# Patient Record
Sex: Male | Born: 1945 | Race: White | Hispanic: No | Marital: Married | Smoking: Former smoker
Health system: Southern US, Community
[De-identification: ages and names within clinical notes are randomized; demographics above are authoritative.]

## PROBLEM LIST (undated history)

## (undated) DIAGNOSIS — E785 Hyperlipidemia, unspecified: Secondary | ICD-10-CM

## (undated) DIAGNOSIS — I472 Ventricular tachycardia, unspecified: Secondary | ICD-10-CM

## (undated) DIAGNOSIS — I5022 Chronic systolic (congestive) heart failure: Secondary | ICD-10-CM

## (undated) DIAGNOSIS — I255 Ischemic cardiomyopathy: Secondary | ICD-10-CM

## (undated) DIAGNOSIS — I4891 Unspecified atrial fibrillation: Secondary | ICD-10-CM

## (undated) DIAGNOSIS — I10 Essential (primary) hypertension: Secondary | ICD-10-CM

## (undated) DIAGNOSIS — I2510 Atherosclerotic heart disease of native coronary artery without angina pectoris: Secondary | ICD-10-CM

## (undated) HISTORY — DX: Essential (primary) hypertension: I10

## (undated) HISTORY — DX: Ventricular tachycardia, unspecified: I47.20

## (undated) HISTORY — DX: Ventricular tachycardia: I47.2

## (undated) HISTORY — DX: Chronic systolic (congestive) heart failure: I50.22

## (undated) HISTORY — PX: CARDIAC CATHETERIZATION: SHX172

## (undated) HISTORY — DX: Atherosclerotic heart disease of native coronary artery without angina pectoris: I25.10

## (undated) HISTORY — DX: Hyperlipidemia, unspecified: E78.5

## (undated) HISTORY — DX: Unspecified atrial fibrillation: I48.91

## (undated) HISTORY — PX: BLADDER TUMOR EXCISION: SHX238

## (undated) HISTORY — DX: Ischemic cardiomyopathy: I25.5

---

## 1995-05-06 HISTORY — PX: CORONARY ARTERY BYPASS GRAFT: SHX141

## 1997-12-24 ENCOUNTER — Ambulatory Visit (HOSPITAL_COMMUNITY): Admission: RE | Admit: 1997-12-24 | Discharge: 1997-12-24 | Payer: Self-pay | Admitting: Orthopedic Surgery

## 1998-05-24 ENCOUNTER — Encounter: Payer: Self-pay | Admitting: Cardiology

## 1998-05-24 ENCOUNTER — Ambulatory Visit (HOSPITAL_COMMUNITY): Admission: RE | Admit: 1998-05-24 | Discharge: 1998-05-24 | Payer: Self-pay | Admitting: Cardiology

## 1998-06-18 ENCOUNTER — Ambulatory Visit (HOSPITAL_COMMUNITY): Admission: RE | Admit: 1998-06-18 | Discharge: 1998-06-19 | Payer: Self-pay | Admitting: Orthopedic Surgery

## 1999-07-11 ENCOUNTER — Encounter: Payer: Self-pay | Admitting: Emergency Medicine

## 1999-07-11 ENCOUNTER — Emergency Department (HOSPITAL_COMMUNITY): Admission: EM | Admit: 1999-07-11 | Discharge: 1999-07-11 | Payer: Self-pay | Admitting: Emergency Medicine

## 1999-08-15 ENCOUNTER — Emergency Department (HOSPITAL_COMMUNITY): Admission: EM | Admit: 1999-08-15 | Discharge: 1999-08-15 | Payer: Self-pay | Admitting: Emergency Medicine

## 1999-09-11 ENCOUNTER — Encounter: Payer: Self-pay | Admitting: Emergency Medicine

## 1999-09-11 ENCOUNTER — Emergency Department (HOSPITAL_COMMUNITY): Admission: EM | Admit: 1999-09-11 | Discharge: 1999-09-11 | Payer: Self-pay | Admitting: Emergency Medicine

## 2000-11-24 ENCOUNTER — Emergency Department (HOSPITAL_COMMUNITY): Admission: EM | Admit: 2000-11-24 | Discharge: 2000-11-24 | Payer: Self-pay | Admitting: *Deleted

## 2000-11-25 ENCOUNTER — Inpatient Hospital Stay (HOSPITAL_COMMUNITY): Admission: EM | Admit: 2000-11-25 | Discharge: 2000-11-26 | Payer: Self-pay | Admitting: Emergency Medicine

## 2000-11-25 ENCOUNTER — Encounter: Payer: Self-pay | Admitting: Emergency Medicine

## 2000-12-31 ENCOUNTER — Ambulatory Visit (HOSPITAL_BASED_OUTPATIENT_CLINIC_OR_DEPARTMENT_OTHER): Admission: RE | Admit: 2000-12-31 | Discharge: 2000-12-31 | Payer: Self-pay | Admitting: Orthopedic Surgery

## 2001-04-19 ENCOUNTER — Emergency Department (HOSPITAL_COMMUNITY): Admission: EM | Admit: 2001-04-19 | Discharge: 2001-04-19 | Payer: Self-pay | Admitting: Emergency Medicine

## 2001-08-08 ENCOUNTER — Inpatient Hospital Stay (HOSPITAL_COMMUNITY): Admission: EM | Admit: 2001-08-08 | Discharge: 2001-08-10 | Payer: Self-pay

## 2001-08-08 ENCOUNTER — Encounter: Payer: Self-pay | Admitting: Cardiology

## 2002-07-15 ENCOUNTER — Emergency Department (HOSPITAL_COMMUNITY): Admission: EM | Admit: 2002-07-15 | Discharge: 2002-07-15 | Payer: Self-pay | Admitting: Emergency Medicine

## 2004-04-24 ENCOUNTER — Ambulatory Visit: Payer: Self-pay | Admitting: Physician Assistant

## 2004-05-05 HISTORY — PX: OTHER SURGICAL HISTORY: SHX169

## 2004-05-09 ENCOUNTER — Ambulatory Visit: Payer: Self-pay | Admitting: Pain Medicine

## 2004-05-22 ENCOUNTER — Ambulatory Visit: Payer: Self-pay | Admitting: Physician Assistant

## 2004-06-14 ENCOUNTER — Inpatient Hospital Stay (HOSPITAL_COMMUNITY): Admission: EM | Admit: 2004-06-14 | Discharge: 2004-06-19 | Payer: Self-pay | Admitting: Emergency Medicine

## 2004-07-22 ENCOUNTER — Ambulatory Visit: Payer: Self-pay | Admitting: Physician Assistant

## 2004-08-21 ENCOUNTER — Ambulatory Visit: Payer: Self-pay | Admitting: Physician Assistant

## 2004-09-17 ENCOUNTER — Ambulatory Visit: Payer: Self-pay | Admitting: Physician Assistant

## 2004-10-17 ENCOUNTER — Ambulatory Visit: Payer: Self-pay | Admitting: Physician Assistant

## 2004-11-06 ENCOUNTER — Encounter: Payer: Self-pay | Admitting: Pain Medicine

## 2004-11-15 ENCOUNTER — Ambulatory Visit: Payer: Self-pay | Admitting: Physician Assistant

## 2004-12-03 ENCOUNTER — Encounter: Payer: Self-pay | Admitting: Pain Medicine

## 2004-12-12 ENCOUNTER — Ambulatory Visit: Payer: Self-pay | Admitting: Physician Assistant

## 2005-01-09 ENCOUNTER — Ambulatory Visit: Payer: Self-pay | Admitting: Physician Assistant

## 2005-02-10 ENCOUNTER — Ambulatory Visit: Payer: Self-pay | Admitting: Physician Assistant

## 2005-03-13 ENCOUNTER — Ambulatory Visit: Payer: Self-pay | Admitting: Physician Assistant

## 2005-04-14 ENCOUNTER — Ambulatory Visit: Payer: Self-pay | Admitting: Physician Assistant

## 2005-05-13 ENCOUNTER — Ambulatory Visit: Payer: Self-pay | Admitting: Physician Assistant

## 2005-06-12 ENCOUNTER — Ambulatory Visit: Payer: Self-pay | Admitting: Physician Assistant

## 2005-07-18 ENCOUNTER — Ambulatory Visit: Payer: Self-pay | Admitting: Physician Assistant

## 2005-08-13 ENCOUNTER — Ambulatory Visit: Payer: Self-pay | Admitting: Physician Assistant

## 2005-09-10 ENCOUNTER — Ambulatory Visit: Payer: Self-pay | Admitting: Physician Assistant

## 2005-09-18 ENCOUNTER — Ambulatory Visit: Payer: Self-pay | Admitting: Pain Medicine

## 2005-10-13 ENCOUNTER — Ambulatory Visit: Payer: Self-pay | Admitting: Physician Assistant

## 2006-02-03 ENCOUNTER — Ambulatory Visit: Payer: Self-pay | Admitting: Physician Assistant

## 2006-03-11 ENCOUNTER — Ambulatory Visit: Payer: Self-pay | Admitting: Physician Assistant

## 2006-04-09 ENCOUNTER — Ambulatory Visit: Payer: Self-pay | Admitting: Physician Assistant

## 2006-05-11 ENCOUNTER — Ambulatory Visit: Payer: Self-pay | Admitting: Pain Medicine

## 2006-07-09 ENCOUNTER — Ambulatory Visit: Payer: Self-pay | Admitting: Physician Assistant

## 2006-10-07 ENCOUNTER — Ambulatory Visit: Payer: Self-pay | Admitting: Physician Assistant

## 2006-12-05 ENCOUNTER — Emergency Department: Payer: Self-pay | Admitting: Emergency Medicine

## 2006-12-19 ENCOUNTER — Emergency Department: Payer: Self-pay | Admitting: Emergency Medicine

## 2007-01-05 ENCOUNTER — Ambulatory Visit: Payer: Self-pay | Admitting: Physician Assistant

## 2007-04-05 ENCOUNTER — Ambulatory Visit: Payer: Self-pay | Admitting: Physician Assistant

## 2007-04-07 ENCOUNTER — Ambulatory Visit: Payer: Self-pay | Admitting: Physician Assistant

## 2007-05-10 ENCOUNTER — Ambulatory Visit: Payer: Self-pay | Admitting: Physician Assistant

## 2007-05-26 ENCOUNTER — Encounter: Payer: Self-pay | Admitting: Pain Medicine

## 2007-06-06 ENCOUNTER — Encounter: Payer: Self-pay | Admitting: Pain Medicine

## 2007-06-08 ENCOUNTER — Ambulatory Visit: Payer: Self-pay | Admitting: Physician Assistant

## 2007-07-04 ENCOUNTER — Encounter: Payer: Self-pay | Admitting: Pain Medicine

## 2007-07-08 ENCOUNTER — Ambulatory Visit: Payer: Self-pay | Admitting: Physician Assistant

## 2007-08-05 ENCOUNTER — Ambulatory Visit: Payer: Self-pay | Admitting: Physician Assistant

## 2007-09-08 ENCOUNTER — Ambulatory Visit: Payer: Self-pay | Admitting: Physician Assistant

## 2007-10-06 ENCOUNTER — Ambulatory Visit: Payer: Self-pay | Admitting: Physician Assistant

## 2007-11-04 ENCOUNTER — Ambulatory Visit: Payer: Self-pay | Admitting: Physician Assistant

## 2007-12-09 ENCOUNTER — Ambulatory Visit: Payer: Self-pay | Admitting: Physician Assistant

## 2008-01-06 ENCOUNTER — Ambulatory Visit: Payer: Self-pay | Admitting: Physician Assistant

## 2008-02-23 ENCOUNTER — Ambulatory Visit: Payer: Self-pay | Admitting: Physician Assistant

## 2008-05-10 ENCOUNTER — Ambulatory Visit: Payer: Self-pay | Admitting: Physician Assistant

## 2008-08-03 ENCOUNTER — Ambulatory Visit: Payer: Self-pay | Admitting: Physician Assistant

## 2008-08-21 ENCOUNTER — Encounter: Payer: Self-pay | Admitting: Cardiovascular Disease

## 2008-10-17 ENCOUNTER — Ambulatory Visit: Payer: Self-pay | Admitting: Physician Assistant

## 2009-01-11 ENCOUNTER — Ambulatory Visit: Payer: Self-pay | Admitting: Physician Assistant

## 2009-02-12 ENCOUNTER — Ambulatory Visit: Payer: Self-pay | Admitting: Cardiovascular Disease

## 2009-02-12 ENCOUNTER — Inpatient Hospital Stay (HOSPITAL_COMMUNITY): Admission: EM | Admit: 2009-02-12 | Discharge: 2009-02-15 | Payer: Self-pay | Admitting: Emergency Medicine

## 2009-04-17 ENCOUNTER — Ambulatory Visit: Payer: Self-pay | Admitting: Physician Assistant

## 2009-05-06 ENCOUNTER — Ambulatory Visit: Payer: Self-pay | Admitting: Internal Medicine

## 2009-05-06 ENCOUNTER — Inpatient Hospital Stay (HOSPITAL_COMMUNITY)
Admission: EM | Admit: 2009-05-06 | Discharge: 2009-05-09 | Payer: Self-pay | Source: Home / Self Care | Admitting: Emergency Medicine

## 2009-05-07 ENCOUNTER — Encounter: Payer: Self-pay | Admitting: Cardiovascular Disease

## 2009-05-07 ENCOUNTER — Encounter (INDEPENDENT_AMBULATORY_CARE_PROVIDER_SITE_OTHER): Payer: Self-pay | Admitting: Cardiovascular Disease

## 2009-05-08 ENCOUNTER — Encounter: Payer: Self-pay | Admitting: Cardiovascular Disease

## 2009-05-09 ENCOUNTER — Encounter: Payer: Self-pay | Admitting: Cardiovascular Disease

## 2009-05-18 ENCOUNTER — Encounter: Admission: RE | Admit: 2009-05-18 | Discharge: 2009-05-18 | Payer: Self-pay | Admitting: Cardiovascular Disease

## 2009-06-11 ENCOUNTER — Ambulatory Visit: Payer: Self-pay | Admitting: Pain Medicine

## 2009-06-21 ENCOUNTER — Encounter: Payer: Self-pay | Admitting: Cardiovascular Disease

## 2009-06-21 ENCOUNTER — Encounter: Payer: Self-pay | Admitting: Internal Medicine

## 2009-06-21 ENCOUNTER — Ambulatory Visit: Payer: Self-pay | Admitting: Internal Medicine

## 2009-06-21 DIAGNOSIS — I440 Atrioventricular block, first degree: Secondary | ICD-10-CM

## 2009-06-21 DIAGNOSIS — E7800 Pure hypercholesterolemia, unspecified: Secondary | ICD-10-CM | POA: Insufficient documentation

## 2009-06-21 DIAGNOSIS — I2589 Other forms of chronic ischemic heart disease: Secondary | ICD-10-CM | POA: Insufficient documentation

## 2009-06-21 DIAGNOSIS — Z95810 Presence of automatic (implantable) cardiac defibrillator: Secondary | ICD-10-CM | POA: Insufficient documentation

## 2009-06-21 DIAGNOSIS — I472 Ventricular tachycardia, unspecified: Secondary | ICD-10-CM | POA: Insufficient documentation

## 2009-06-21 DIAGNOSIS — I25810 Atherosclerosis of coronary artery bypass graft(s) without angina pectoris: Secondary | ICD-10-CM

## 2009-06-21 DIAGNOSIS — R0989 Other specified symptoms and signs involving the circulatory and respiratory systems: Secondary | ICD-10-CM

## 2009-06-21 DIAGNOSIS — I452 Bifascicular block: Secondary | ICD-10-CM | POA: Insufficient documentation

## 2009-06-21 DIAGNOSIS — R0609 Other forms of dyspnea: Secondary | ICD-10-CM

## 2009-07-20 ENCOUNTER — Ambulatory Visit: Payer: Self-pay | Admitting: Cardiovascular Disease

## 2009-07-23 ENCOUNTER — Encounter: Payer: Self-pay | Admitting: Cardiovascular Disease

## 2009-07-23 ENCOUNTER — Ambulatory Visit: Payer: Self-pay | Admitting: Internal Medicine

## 2009-07-23 DIAGNOSIS — R740 Nonspecific elevation of levels of transaminase and lactic acid dehydrogenase [LDH]: Secondary | ICD-10-CM

## 2009-07-23 DIAGNOSIS — I959 Hypotension, unspecified: Secondary | ICD-10-CM | POA: Insufficient documentation

## 2009-07-24 LAB — CONVERTED CEMR LAB
Albumin: 4.1 g/dL (ref 3.5–5.2)
Alkaline Phosphatase: 76 units/L (ref 39–117)
BUN: 23 mg/dL (ref 6–23)
Cortisol, Plasma: 28.5 ug/dL
Free T4: 1.52 ng/dL (ref 0.80–1.80)
Glucose, Bld: 103 mg/dL — ABNORMAL HIGH (ref 70–99)
Hemoglobin: 14.1 g/dL (ref 13.0–17.0)
MCHC: 32.2 g/dL (ref 30.0–36.0)
MCV: 94.4 fL (ref 78.0–100.0)
Potassium: 4.6 meq/L (ref 3.5–5.3)
RBC: 4.64 M/uL (ref 4.22–5.81)
TSH: 4.417 microintl units/mL (ref 0.350–4.500)
Total Bilirubin: 0.5 mg/dL (ref 0.3–1.2)

## 2009-07-31 ENCOUNTER — Encounter: Payer: Self-pay | Admitting: Internal Medicine

## 2009-07-31 ENCOUNTER — Ambulatory Visit: Payer: Self-pay

## 2009-08-02 ENCOUNTER — Ambulatory Visit: Payer: Self-pay | Admitting: Cardiovascular Disease

## 2009-08-06 ENCOUNTER — Encounter: Payer: Self-pay | Admitting: Cardiovascular Disease

## 2009-08-21 ENCOUNTER — Encounter: Payer: Self-pay | Admitting: Cardiovascular Disease

## 2009-08-27 ENCOUNTER — Ambulatory Visit: Payer: Self-pay | Admitting: Internal Medicine

## 2009-09-02 ENCOUNTER — Encounter: Payer: Self-pay | Admitting: Cardiovascular Disease

## 2009-10-03 ENCOUNTER — Encounter: Payer: Self-pay | Admitting: Cardiovascular Disease

## 2009-10-15 ENCOUNTER — Ambulatory Visit: Payer: Self-pay | Admitting: Cardiovascular Disease

## 2009-10-17 ENCOUNTER — Encounter: Payer: Self-pay | Admitting: Cardiovascular Disease

## 2009-10-19 LAB — CONVERTED CEMR LAB
ALT: 45 units/L (ref 0–53)
Bilirubin, Direct: 0.1 mg/dL (ref 0.0–0.3)
Cholesterol: 253 mg/dL — ABNORMAL HIGH (ref 0–200)
Indirect Bilirubin: 0.3 mg/dL (ref 0.0–0.9)
Total Bilirubin: 0.4 mg/dL (ref 0.3–1.2)
VLDL: 63 mg/dL — ABNORMAL HIGH (ref 0–40)

## 2009-10-22 ENCOUNTER — Ambulatory Visit: Payer: Self-pay | Admitting: Cardiovascular Disease

## 2009-10-24 ENCOUNTER — Emergency Department: Payer: Self-pay | Admitting: Emergency Medicine

## 2009-11-09 ENCOUNTER — Telehealth: Payer: Self-pay | Admitting: Cardiovascular Disease

## 2009-11-28 ENCOUNTER — Ambulatory Visit: Payer: Self-pay | Admitting: Internal Medicine

## 2009-12-03 ENCOUNTER — Encounter: Payer: Self-pay | Admitting: Cardiovascular Disease

## 2010-01-08 ENCOUNTER — Ambulatory Visit: Payer: Self-pay | Admitting: Cardiovascular Disease

## 2010-01-10 ENCOUNTER — Telehealth (INDEPENDENT_AMBULATORY_CARE_PROVIDER_SITE_OTHER): Payer: Self-pay | Admitting: *Deleted

## 2010-01-14 LAB — CONVERTED CEMR LAB
ALT: 23 units/L (ref 0–53)
Albumin: 4.2 g/dL (ref 3.5–5.2)
HDL: 35 mg/dL — ABNORMAL LOW (ref >39)
Indirect Bilirubin: 0.4 mg/dL (ref 0.0–0.9)
LDL Cholesterol: 33 mg/dL (ref 0–99)
Total CHOL/HDL Ratio: 3
Total Protein: 6.5 g/dL (ref 6.0–8.3)
Triglycerides: 188 mg/dL — ABNORMAL HIGH (ref <150)
VLDL: 38 mg/dL (ref 0–40)

## 2010-03-05 ENCOUNTER — Encounter: Payer: Self-pay | Admitting: Internal Medicine

## 2010-03-05 ENCOUNTER — Ambulatory Visit: Payer: Self-pay | Admitting: Cardiovascular Disease

## 2010-04-23 ENCOUNTER — Ambulatory Visit: Payer: Self-pay | Admitting: Cardiovascular Disease

## 2010-04-23 ENCOUNTER — Encounter: Payer: Self-pay | Admitting: Cardiovascular Disease

## 2010-05-27 ENCOUNTER — Encounter: Payer: Self-pay | Admitting: Cardiovascular Disease

## 2010-05-29 ENCOUNTER — Telehealth: Payer: Self-pay | Admitting: Cardiovascular Disease

## 2010-05-29 ENCOUNTER — Observation Stay (HOSPITAL_COMMUNITY)
Admission: EM | Admit: 2010-05-29 | Discharge: 2010-05-30 | Payer: Self-pay | Source: Home / Self Care | Attending: Internal Medicine | Admitting: Internal Medicine

## 2010-05-29 LAB — DIFFERENTIAL
Basophils Absolute: 0 10*3/uL (ref 0.0–0.1)
Eosinophils Relative: 2 % (ref 0–5)
Lymphocytes Relative: 27 % (ref 12–46)
Neutro Abs: 4.5 10*3/uL (ref 1.7–7.7)
Neutrophils Relative %: 62 % (ref 43–77)

## 2010-05-29 LAB — POCT I-STAT, CHEM 8
Chloride: 106 mEq/L (ref 96–112)
Glucose, Bld: 92 mg/dL (ref 70–99)
HCT: 48 % (ref 39.0–52.0)
Potassium: 4.1 mEq/L (ref 3.5–5.1)

## 2010-05-29 LAB — DIGOXIN LEVEL: Digoxin Level: 0.3 ng/mL — ABNORMAL LOW (ref 0.8–2.0)

## 2010-05-29 LAB — POCT CARDIAC MARKERS
CKMB, poc: 1.7 ng/mL (ref 1.0–8.0)
Myoglobin, poc: 108 ng/mL (ref 12–200)

## 2010-05-29 LAB — CBC
HCT: 45.6 % (ref 39.0–52.0)
RBC: 5.02 MIL/uL (ref 4.22–5.81)
RDW: 13.5 % (ref 11.5–15.5)
WBC: 7.3 10*3/uL (ref 4.0–10.5)

## 2010-05-29 LAB — CK TOTAL AND CKMB (NOT AT ARMC): CK, MB: 2.4 ng/mL (ref 0.3–4.0)

## 2010-05-30 LAB — COMPREHENSIVE METABOLIC PANEL
AST: 25 U/L (ref 0–37)
Albumin: 3.5 g/dL (ref 3.5–5.2)
Calcium: 9 mg/dL (ref 8.4–10.5)
Creatinine, Ser: 1.15 mg/dL (ref 0.4–1.5)
GFR calc Af Amer: >60 mL/min (ref >60)

## 2010-05-30 LAB — CARDIAC PANEL(CRET KIN+CKTOT+MB+TROPI)
Relative Index: 1.4 (ref 0.0–2.5)
Total CK: 154 U/L (ref 7–232)
Troponin I: 0.04 ng/mL (ref 0.00–0.06)

## 2010-06-04 NOTE — Cardiovascular Report (Signed)
Summary: Office Visit   Office Visit   Imported By: Roderic Ovens 11/30/2009 10:20:07  _____________________________________________________________________  External Attachment:    Type:   Image     Comment:   External Document

## 2010-06-04 NOTE — Miscellaneous (Signed)
Summary: Heart Track Discharge  Heart Track Discharge   Imported By: Harlon Flor 10/24/2009 07:51:28  _____________________________________________________________________  External Attachment:    Type:   Image     Comment:   External Document

## 2010-06-04 NOTE — Letter (Signed)
Summary: Discharge Summary  Discharge Summary   Imported By: Harlon Flor 08/06/2009 09:34:27  _____________________________________________________________________  External Attachment:    Type:   Image     Comment:   External Document

## 2010-06-04 NOTE — Assessment & Plan Note (Signed)
Summary: NURSE/AMD  Nurse Visit   Patient Instructions: 1)  Your physician has recommended you make the following change in your medication: hold lisinopril, decrease metoprolol to 50 mg twice a day 2)  Your physician has requested that you regularly monitor and record your blood pressure readings at home.  Please use the same machine at the same time of day to check your readings and record them to bring to your follow-up visit.   Allergies: No Known Drug Allergies  Appended Document: NURSE/AMD vs @ visit: 72/palp with doppler. (L) 68/palp with doppler on (R) HR- 80 SR

## 2010-06-04 NOTE — Medication Information (Signed)
Summary: Tax adviser   Imported By: Harlon Flor 12/10/2009 10:34:53  _____________________________________________________________________  External Attachment:    Type:   Image     Comment:   External Document

## 2010-06-04 NOTE — Cardiovascular Report (Signed)
Summary: Cardiac Cath Other  Cardiac Cath Other   Imported By: Harlon Flor 08/06/2009 09:34:02  _____________________________________________________________________  External Attachment:    Type:   Image     Comment:   External Document

## 2010-06-04 NOTE — Procedures (Signed)
Summary: PACER CHECK   Current Medications (verified): 1)  Carvedilol 12.5 Mg Tabs (Carvedilol) .... Take 1 Tablet By Mouth Two Times A Day 2)  Tamsulosin Hcl 0.4 Mg Caps (Tamsulosin Hcl) .Marland Kitchen.. 1 By Mouth Once Daily 3)  Digoxin 0.125 Mg Tabs (Digoxin) .Marland Kitchen.. 1  Tablet By Mouth Daily 4)  Effient 10 Mg Tabs (Prasugrel Hcl) .... Take 1 Tablet By Mouth Once A Day 5)  Nitrostat 0.4 Mg Subl (Nitroglycerin) .Marland Kitchen.. 1 Tablet Under Tongue At Onset of Chest Pain; You May Repeat Every 5 Minutes For Up To 3 Doses. 6)  Niacin Cr 500 Mg Cr-Caps (Niacin) .Marland Kitchen.. 1 By Mouth Once Daily 7)  Fish Oil 1000 Mg Caps (Omega-3 Fatty Acids) .Marland Kitchen.. 1 By Mouth Once Daily 8)  Hydrocodone-Acetaminophen 5-500 Mg Tabs (Hydrocodone-Acetaminophen) .... As Directed 9)  Tylenol 325 Mg Tabs (Acetaminophen) .... As Needed 10)  Aspirin 81 Mg Tbec (Aspirin) .... Take One Tablet By Mouth Daily 11)  Mexiletine Hcl 250 Mg Caps (Mexiletine Hcl) .... Take 1 By Mouth Two Times A Day 12)  Klor-Con 10 10 Meq Cr-Tabs (Potassium Chloride) .... Take 2 By Mouth Once Daily 13)  Furosemide 20 Mg Tabs (Furosemide) .... Take One Tablet By Mouth Daily. 14)  Crestor 10 Mg Tabs (Rosuvastatin Calcium) .... Take 1/2  Tablet By Mouth Daily.  Allergies (verified): No Known Drug Allergies    ICD Specifications Following MD:  Sherryl Manges, MD     ICD Vendor:  [employer]     ICD Model Number:  [id number]     ICD Serial Number:  [id number] ICD DOI:  06/17/2004     ICD Implanting MD:  NOT IMPLANTED BY [place]  Lead 1:    Location: RA     DOI: 06/17/2004     Model #: [id number]     Serial #: [id number]     Status: active Lead 2:    Location: RV     DOI: 06/17/2004     Model #: [id number]     Status: active  Indications::  VT   ICD Follow Up Remote Check?  No Battery Voltage:  2.66 V     Charge Time:  10.9 seconds     Underlying rhythm:  SR ICD Dependent:  No       ICD Device Measurements Atrium:  Amplitude: 2.1 mV, Impedance: 544 ohms, Threshold: 1.0 V at 0.4 msec Right  Ventricle:  Amplitude: 4.7 mV, Impedance: 608 ohms, Threshold: 0.5 V at 0.4 msec Shock Impedance: 47/61 ohms   Episodes MS Episodes:  0     Percent Mode Switch:  0     Coumadin:  No Shock:  0     ATP:  2     Nonsustained:  0     Atrial Pacing:  94.3%     Ventricular Pacing:  2.8%  Brady Parameters Mode DDDR     Lower Rate Limit:  70     Upper Rate Limit 110 PAV 280     Sensed AV Delay:  240 Rate Response Parameters:  Rate response 8, medium/low  Tachy Zones VF:  214     VT:  150     VT1:  130     Next Cardiology Appt Due:  06/05/2010 Tech Comments:  No parameter changes.  2 VT episodes back to back treated appropriately with ATP therapy.  ROV 3 months  clinic. Altha Harm, LPN  March 05, 2010 4:19 PM

## 2010-06-04 NOTE — Progress Notes (Signed)
Summary: MEDICATIONS  Phone Note Call from Patient Call back at Home Phone [phone]   Caller: Patient Call For: Armonie Mettler Summary of Call: PATIENT WOULD LIKE TO KNOW IF ANY OF HIS MEDICATIONS COULD BE SWITCHED TO GEL CAPSULE AS HE RECENTLY IS HAVING TROUBLE SWALLOWING HIS PILLS. Initial call taken by: [place] Carbo,  November 09, 2009 11:01 AM  Follow-up for Phone Call        pt instructed to talk to pharmacist about which pills he can crush.  Follow-up by: Benedict Needy, RN,  November 09, 2009 12:02 PM

## 2010-06-04 NOTE — Assessment & Plan Note (Signed)
Summary: f/u 3 weeks   Referring Provider:  gollan Primary Provider:  n/a  CC:  ROV; Device Check.  History of Present Illness:  Andrew [name] is seen in followup for ventricular tachycardia occurring in the setting of ischemic heart disease prior bypass surgery in 1997 intercurrent PCI in 2006 and again in 2010 with depressed left ventricular function estimated at 30-40%.  he initially presented with cardiac arrest;  He is s/p ICD implantation with a [phone] lead  For  atrial fibrillation and ventricular tachycardia he taken amiodarone which is associated with some degree of malaise. We stopped it her last visit. Intercurrently he has   been much better  transaminases were checked again in March by Dr. Mariah [name]. They are elevated compared to January.            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 Take One Tablet By Mouth Twice A Day 2)  Tamsulosin Hcl 0.4 Mg Caps (Tamsulosin Hcl) .Marland Kitchen.. 1 By Mouth Once Daily 3)  Digoxin 0.125 Mg Tabs (Digoxin) .Marland Kitchen.. 1  Tablet By Mouth Daily 4)  Simvastatin 80 Mg Tabs (Simvastatin) .... 1/2  Tablet By Mouth Daily At Bedtime 5)  Plavix 75 Mg Tabs (Clopidogrel Bisulfate) .... Take One Tablet By Mouth Daily 6)  Pantoprazole Sodium 40 Mg Tbec (Pantoprazole Sodium) .Marland Kitchen.. 1 By  Mouth Once Daily 7)  Sertraline Hcl 50 Mg Tabs (Sertraline Hcl) .Marland Kitchen.. 1 By Mouth Once Daily As Needed (Out) 8)  Nitrostat 0.4 Mg Subl (Nitroglycerin) .Marland Kitchen.. 1 Tablet Under Tongue At Onset of Chest Pain; You May Repeat Every 5 Minutes For Up To 3 Doses. 9)  Niacin Cr 500 Mg Cr-Caps (Niacin) .Marland Kitchen.. 1 By Mouth Once Daily 10)  Fish Oil 1000 Mg Caps (Omega-3 Fatty Acids) .Marland Kitchen.. 1 By Mouth Once Daily 11)  Hydrocodone-Acetaminophen 5-500 Mg Tabs (Hydrocodone-Acetaminophen) .... As Directed 12)  Tylenol 325 Mg Tabs (Acetaminophen) .... As Needed 13)  Aspirin 81 Mg Tbec (Aspirin) .... Take One Tablet By Mouth Daily 14)  Mexiletine Hcl 250 Mg Caps (Mexiletine Hcl) .... Take 1 By Mouth Two Times A Day 15)  Klor-Con 10 10 Meq Cr-Tabs (Potassium Chloride) .... Take 2 By Mouth Once Daily 16)  Furosemide 20 Mg Tabs (Furosemide) .... Take One Tablet By Mouth Daily.  Allergies (verified): No Known Drug Allergies  Vital Signs:  Patient profile:   65 year old male Height:      68 inches Weight:      183 pounds Pulse rate:   72 / minute BP sitting:   126 / 70  (left arm)  Vitals Entered By: Andrew [name], EMT-P (August 27, 2009 11:55 AM)  Physical Exam  General:  The patient was alert and oriented in no acute distress. HEENT Normal.  Neck veins were flat, carotids were brisk.  Lungs were clear.  Heart  sounds were regular without murmurs or gallops.  Abdomen was soft with active bowel sounds. There is no clubbing cyanosis or edema. Skin Warm and dry     ICD Specifications Following MD:  Andrew Manges, MD     ICD Vendor:  [employer]     ICD Model Number:  [id number]     ICD Serial Number:  [id number] ICD DOI:  06/17/2004     ICD Implanting MD:  NOT IMPLANTED BY [place]  Lead 1:    Location: RA     DOI: 06/17/2004     Model #: 1517     Serial #: [id number]     Status: active Lead 2:    Location: RV     DOI: 06/17/2004     Model #: [id number]     Status: active  ICD Follow Up Remote Check?  No Battery Voltage:  2.82 V      Charge Time:  10.0 seconds     ICD Dependent:  Yes       ICD Device Measurements Atrium:  Amplitude: 2.1 mV, Impedance: 552 ohms, Threshold: 1.0 V at 0.4 msec Right Ventricle:  Amplitude: 5.4 mV, Impedance: 592 ohms, Threshold: 0.5 V at 0.4 msec Shock Impedance: 47/61 ohms   Episodes MS Episodes:  0     Percent Mode Switch:  0     Coumadin:  No Shock:  0     ATP:  1     Nonsustained:  0     ICD Appropriate Therapy?  Yes Atrial Pacing:  96.4%     Ventricular Pacing:  94.3%  Brady Parameters Mode DDDR     Lower Rate Limit:  70     Upper Rate Limit 110 PAV 280     Sensed AV Delay:  240 Rate Response Parameters:  Rate response 8, medium/low  Tachy Zones VF:  214     VT:  150     VT1:  130     Next Cardiology Appt Due:  11/02/2009 Tech Comments:  Atrial sensitivity reprogrammed 1.57mV FFRW.  No Carelink @ this time.   ROV 3 months Andrew [name] in [place]. Andrew Harm, LPN  August 27, 2009 12:02 PM   Impression & Recommendations:  Problem # 1:  TRANSAMINASES, SERUM, ELEVATED (ICD-790.4) Iwill plan to discontinue his simvastatin at this time. We'll recheck his liver tests in about 6 weeks when he comes back to see Dr. Mariah [name]. He'll be maintained off of his amiodarone.  Problem # 2:  VENTRICULAR TACHYCARDIA (ICD-427.1) He had a recurrent and intercurrent episode of VT treated with antitachycardia pacing. It is very slow with a cycle length of approximately 450 ms occurring in the context of sinus tachycardia. When he sees Dr. Mariah [name] I will ask him to increase his carvedilol. His updated medication list for this problem includes:    Carvedilol 6.25 Mg Tabs (Carvedilol) .Marland Kitchen... Take one tablet by mouth twice a day    Plavix 75 Mg Tabs (Clopidogrel bisulfate) .Marland Kitchen... Take one tablet by mouth daily    Nitrostat 0.4 Mg Subl (Nitroglycerin) .Marland Kitchen... 1 tablet under tongue at onset of chest pain; you may repeat every 5 minutes for up to 3 doses.    Aspirin 81 Mg Tbec (Aspirin) .Marland Kitchen... Take one tablet by  mouth daily    Mexiletine Hcl 250 Mg Caps (Mexiletine hcl) .Marland Kitchen... 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 Take one tablet by mouth twice a day    Plavix 75 Mg Tabs (Clopidogrel bisulfate) .Marland Kitchen... Take one tablet by mouth daily    Nitrostat 0.4 Mg Subl (Nitroglycerin) .Marland Kitchen... 1 tablet under tongue at onset of chest pain; you may repeat every 5 minutes for up to 3 doses.    Aspirin 81 Mg Tbec (Aspirin) .Marland Kitchen... Take one tablet by mouth daily    Mexiletine Hcl 250 Mg Caps (Mexiletine hcl) .Marland Kitchen... Take 1 by mouth two times a day  Problem # 4:  ATRIAL FIBRILLATION-PAROXYSMAL (ICD-427.31) no intercurrent AS His updated medication list for this problem includes:    Carvedilol 6.25 Mg Tabs (Carvedilol) .Marland Kitchen... Take one tablet by mouth twice a day    Digoxin 0.125 Mg Tabs (Digoxin) .Marland Kitchen... 1  tablet by mouth daily    Plavix 75 Mg Tabs (Clopidogrel bisulfate) .Marland Kitchen... Take one tablet by mouth daily    Aspirin 81 Mg Tbec (Aspirin) .Marland Kitchen... Take one tablet by mouth daily    Mexiletine Hcl 250 Mg Caps (Mexiletine hcl) .Marland Kitchen... Take 1 by mouth two times a day  Problem # 5:  CARDIOMYOPATHY, ISCHEMIC (ICD-414.8) stable on current medications His updated medication list for this problem includes:    Carvedilol 6.25 Mg Tabs (Carvedilol) .Marland Kitchen... Take one tablet by mouth twice a day    Digoxin 0.125 Mg Tabs (Digoxin) .Marland Kitchen... 1  tablet by mouth daily    Plavix 75 Mg Tabs (Clopidogrel bisulfate) .Marland Kitchen... Take one tablet by mouth daily    Nitrostat 0.4 Mg Subl (Nitroglycerin) .Marland Kitchen... 1 tablet under tongue at onset of chest pain; you may repeat every 5 minutes for up to 3 doses.    Aspirin 81 Mg Tbec (Aspirin) .Marland Kitchen...  Take one tablet by mouth daily    Mexiletine Hcl 250 Mg Caps (Mexiletine hcl) .Marland Kitchen... Take 1 by mouth two times a day    Furosemide 20 Mg Tabs (Furosemide) .Marland Kitchen... Take one tablet by mouth daily.  Problem # 6:  SPRINT FIDELIS  LEAD (ICD-996.04) And no evidence of bleed issues with his 6948 lead  Problem # 7:  ICD - IN SITU (ICD-V45.02) Device parameters and data were reviewed and no changes were made  activity parameters are much improved  Patient Instructions: 1)  Your physician recommends that you schedule a follow-up appointment in: 8 weeks with Andrew [name], 3 months with Andrew [name] 2)  Your physician recommends that you return for lab work in: 7 weeks (LFTs) 3)  Your physician has recommended you make the following change in your medication: stop simvastatin

## 2010-06-04 NOTE — Assessment & Plan Note (Signed)
Summary: icd check.[email or web address]   Visit Type:  New Patient Primary Provider:  n/a  CC:  not feeling up to par, no cp, shortness of breath all the time, and no edema in ankles or feet.  History of Present Illness: .[name] is seen in followup for ventricular tachycardia occurring in the setting of ischemic heart disease prior bypass surgery in 1997 intercurrent PCI in 2006 and again in 2010 with depressed left ventricular function estimated at 30-40%.  He initially presented with an out of hospital arrest and underwent ICD implantation 2006 utilizing a Sprint fidelis lead. He has had no issues with the lead to date .  history on amiodarone for ventricular tachycardia suppression with slightly abnormal thyroid studies.  He also has paroxysmal atrial fibrillation.    6. The patient received appropriate shock on September 5, 2  Current Problems (verified): 1)  Icd - in Situ  (ICD-V45.02) 2)  Hypercholesterolemia Iia  (ICD-272.0) 3)  Cardiomyopathy, Ischemic  (ICD-414.8) 4)  Cad, Artery Bypass Graft  (ICD-414.04)  Current Medications (verified): 1)  Metoprolol Tartrate 100 Mg Tabs (Metoprolol Tartrate) .... Take One Tablet By Mouth Twice A Day 2)  Pacerone 200 Mg Tabs (Amiodarone Hcl) .... 2 By Mouth Two Times A Day 3)  Spironolactone 25 Mg Tabs (Spironolactone) .... 1/2  Tablet By Mouth Daily 4)  Tamsulosin Hcl 0.4 Mg Caps (Tamsulosin Hcl) .Marland Kitchen.. 1 By Mouth Once Daily 5)  Digoxin 0.125 Mg Tabs (Digoxin) .Marland Kitchen.. 1  Tablet By Mouth Daily 6)  Simvastatin 80 Mg Tabs (Simvastatin) .... Take One Tablet By Mouth Daily At Bedtime 7)  Plavix 75 Mg Tabs (Clopidogrel Bisulfate) .... Take One Tablet By Mouth Daily 8)  Pantoprazole Sodium 40 Mg Tbec (Pantoprazole Sodium) .Marland Kitchen.. 1 By Mouth Once Daily 9)  Lisinopril 20 Mg Tabs (Lisinopril) .... Take One Tablet By Mouth Daily 10)  Sertraline Hcl 50 Mg Tabs (Sertraline Hcl) .Marland Kitchen.. 1 By Mouth Once Daily 11)  Nitrostat 0.4 Mg Subl (Nitroglycerin) .Marland Kitchen.. 1 Tablet  Under Tongue At Onset of Chest Pain; You May Repeat Every 5 Minutes For Up To 3 Doses. 12)  Niacin Cr 500 Mg Cr-Caps (Niacin) .Marland Kitchen.. 1 By Mouth Once Daily 13)  Fish Oil 1000 Mg Caps (Omega-3 Fatty Acids) .Marland Kitchen.. 1 By Mouth Once Daily 14)  Hydrocodone-Acetaminophen 5-500 Mg Tabs (Hydrocodone-Acetaminophen) .... As Directed 15)  Tylenol 325 Mg Tabs (Acetaminophen) .... As Needed 16)  Aspirin 81 Mg Tbec (Aspirin) .... Take One Tablet By Mouth Daily  Allergies (verified): No Known Drug Allergies  Past History:  Past Medical History: Last updated: 06/21/2009  1. Near-syncopal spell secondary to slow ventricular tachycardia.   2. Known ischemic cardiomyopathy with an ejection fraction of 25-30%       by echocardiogram this admission.   3. Coronary artery disease, catheterization this admission revealing       occluded saphenous vein graft to right coronary artery, plan is for       medical therapy.   4. Known coronary artery disease with previous bypass surgery in 1997       with saphenous vein graft to right coronary artery percutaneous       coronary intervention in February 2006, in-stent restenosis treated       with percutaneous coronary intervention in October 2010, now with       occluded saphenous vein graft to right coronary artery.   5. History of a Medtronic implantable cardioverter-defibrillator with       Sprint Fidelis lead in 2006.  6. Slightly abnormal thyroid studies, amiodarone was increased this       admission.   7. Treated dyslipidemia.   Past Surgical History: Last updated: 06/21/2009 CABG  Family History: Last updated: 06/21/2009  Mother had congestive heart failure, died at age 46.   Father died at age 73 of old age.  He has one brother living, he had  a  myocardial infarction at age 22,he had the subsequent coronary  artery  bypass graft surgery.   Social History: Last updated: 06/21/2009 The patient lives in [place] with his wife.  He   retired in 2002.   He was a Paramedic.  He did smoke about one  pack  per day for many years, quit in 2004 at the age of 20.  Does  not partake  of alcoholic beverages to any great extent.   Family History: Reviewed history from 06/21/2009 and no changes required.  Mother had congestive heart failure, died at age 70.   Father died at age 77 of old age.  He has one brother living, he had  a  myocardial infarction at age 61,he had the subsequent coronary  artery  bypass graft surgery.   Social History: Reviewed history from 06/21/2009 and no changes required. The patient lives in [place] with his wife.  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  Does  not partake  of alcoholic beverages to any great extent.   Vital Signs:  Patient profile:   65 year old male Height:      68 inches Weight:      184.25 pounds BMI:     28.12 Pulse rate:   73 / minute Pulse rhythm:   regular BP sitting:   94 / 76  (left arm) Cuff size:   large  Vitals Entered By: Mercer Pod (June 21, 2009 12:15 PM)  Physical Exam  General:  The patient was alert and oriented in no acute distress. HEENT Normal.  Neck veins were flat, carotids were brisk.  Lungs were clear.  Heart sounds were regular without murmurs or gallops.  Abdomen was soft with active bowel sounds. There is no clubbing cyanosis or edema. Skin Warm and dry affect normal   EKG  Procedure date:  06/21/2009  Findings:      sinus rhythm with first-degree AV block with PR interval of 340 ms with right bundle branch block left anterior fascicular block   ICD Specifications Following MD:  Sherryl Manges, MD     ICD Vendor:  Medtronic     ICD Model Number:  [id number]     ICD Serial Number:  [id number] ICD DOI:  06/17/2004     ICD Implanting MD:  NOT IMPLANTED BY [place]  Lead 1:    Location: RA     DOI: 06/17/2004     Model #: 6301     Serial #: [id number]      Status: active Lead 2:    Location: RV     DOI: 06/17/2004     Model #: 5732     Status: active  ICD Follow Up Remote Check?  No Battery Voltage:  2.86 V     Charge Time:  10.0 seconds     ICD Dependent:  Yes       ICD Device Measurements Atrium:  Amplitude: 3.0 mV, Impedance: 544  ohms, Threshold: 1.0 V at 0.4 msec Right Ventricle:  Amplitude: 6.0 mV, Impedance: 600 ohms, Threshold: 0.5 V at 0.4 msec Shock Impedance: 43/58 ohms   Episodes MS Episodes:  0     Percent Mode Switch:  0     Coumadin:  No Shock:  0     ATP:  3     Nonsustained:  0     ICD Appropriate Therapy?  Yes Atrial Pacing:  100%     Ventricular Pacing:  0.6%  Brady Parameters Mode DDDR     Lower Rate Limit:  70     Upper Rate Limit 110 PAV 280     Sensed AV Delay:  240 Rate Response Parameters:  Rate response 8, medium/low  Tachy Zones VF:  214     VT:  150     VT1:  130     Tech Comments:  AV delay reprogrammed today 280/240.   ROV 5 weeks Dr. Graciela Husbands in [place]. Altha Harm, LPN  June 21, 2009 1:15 PM   Impression & Recommendations:  Problem # 1:  VENTRICULAR TACHYCARDIA (ICD-427.1) There have been 3 intercurrent episodes of ventricular tachycardia treated with antitachycardia pacing. He is however on very high-dose amiodarone and we will decrease it to 400 mg a day.  Problem # 2:  DYSPNEA ON EXERTION (ICD-786.09) His significant dyspnea. The differential diagnosis of this includes congestive heart failure, amiodarone lung toxicity. Potential contributions to congestive heart failure include first degree AV block, left ventricular dysfunction, and conduction system disease with bifascicular block. We'll attempt to reprogram his ICD to allow for RV septal early capture to see if we can create a more synchronous contraction. In the event that this is unsuccessful we'll need to pursue evaluation of his lungs for amiodarone lung injury and may well need to consider a crit of his device to resynchronization His  updated medication list for this problem includes:    Metoprolol Tartrate 100 Mg Tabs (Metoprolol tartrate) .Marland Kitchen... Take one tablet by mouth twice a day    Spironolactone 25 Mg Tabs (Spironolactone) .Marland Kitchen... 1/2  tablet by mouth daily    Digoxin 0.125 Mg Tabs (Digoxin) .Marland Kitchen... 1  tablet by mouth daily    Lisinopril 20 Mg Tabs (Lisinopril) .Marland Kitchen... Take one tablet by mouth daily    Aspirin 81 Mg Tbec (Aspirin) .Marland Kitchen... Take one tablet by mouth daily  Problem # 3:  SPRINT FIDELIS  LEAD (ICD-996.04) no evidence of lead problems. Instructions are given as related to shock therapy  Problem # 4:  CARDIOMYOPATHY, ISCHEMIC (ICD-414.8) no angina.  Problem # 5:  AMIODARONE (ICD-V58.69)  we will decrease his amiodarone dose and measure his digoxin level. 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 we have set his A-V delay after multiple interventions to 280 ms. This is associated with a QRS duration of approximately 140 -50 ms down from his baseline of 170.  hopefully this will translate into a more effective contraction and improved effort tolerance His updated medication list for this problem includes:    Metoprolol Tartrate 100 Mg Tabs (Metoprolol tartrate) .Marland Kitchen... Take one tablet by mouth twice a day    Pacerone  200 Mg Tabs (Amiodarone hcl) .Marland Kitchen... 2 by mouth two times a day    Plavix 75 Mg Tabs (Clopidogrel bisulfate) .Marland Kitchen... Take one tablet by mouth daily    Lisinopril 20 Mg Tabs (Lisinopril) .Marland Kitchen... Take one tablet by mouth daily    Nitrostat 0.4 Mg Subl (Nitroglycerin) .Marland Kitchen... 1 tablet under tongue at onset of chest pain; you may repeat every 5 minutes for up to  3 doses.    Aspirin 81 Mg Tbec (Aspirin) .Marland Kitchen... Take one tablet by mouth daily  Other Orders: EKG w/ Interpretation (93000) T-Digoxin ([id number])  Patient Instructions: 1)  Your physician recommends that you schedule a follow-up appointment in: 4-5 weeks 2)  Your physician has recommended you make the following change in your medication: decrease amiodarone to 400 mg daily

## 2010-06-04 NOTE — Miscellaneous (Signed)
Summary: Heart Track Order  Heart Track Order   Imported By: Harlon Flor 08/06/2009 13:15:07  _____________________________________________________________________  External Attachment:    Type:   Image     Comment:   External Document

## 2010-06-04 NOTE — Letter (Signed)
Summary: Medical Record Release  Medical Record Release   Imported By: Harlon Flor 06/21/2009 15:29:10  _____________________________________________________________________  External Attachment:    Type:   Image     Comment:   External Document

## 2010-06-04 NOTE — Cardiovascular Report (Signed)
Summary: Office Visit   Office Visit   Imported By: Roderic Ovens 03/14/2010 14:28:18  _____________________________________________________________________  External Attachment:    Type:   Image     Comment:   External Document

## 2010-06-04 NOTE — Assessment & Plan Note (Signed)
Summary: F1M/AMD   Visit Type:  Follow-up Referring Provider:  gollan Primary Provider:  n/a  CC:  sluggish, bp running low, very fatique, no cp, some shortness of breath, and no edema in ankles and feet.  History of Present Illness: .Mr. Findling is seen in followup for ventricular tachycardia occurring in the setting of ischemic heart disease prior bypass surgery in 1997 intercurrent PCI in 2006 and again in 2010 with depressed left ventricular function estimated at 30-40%.  He initially presented with an out of hospital arrest and underwent ICD implantation 2006 utilizing a Sprint fidelis lead. He has had no issues with the lead to date. He has also history on amiodarone for ventricular tachycardia suppression;  this was begun in October 2010. TSH was mildly elevated more remotely  Most recent amio labs were concerning with AST49; ALT 74 TSH 4.637  He was seen last week with complaints of lightheadedness and weakness his systolic blood pressure was measured about 55-60  he is noted only GI problems with constipation there's been no change in the odor or color of his stool  He also has paroxysmal atrial fibrillation.      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 1/2  Tablet By Mouth Twice A Day 2)  Pacerone 200 Mg Tabs (Amiodarone Hcl) .... 2 By Mouth Once A Day 3)  Spironolactone 25  Mg Tabs (Spironolactone) .... 1/2  Tablet By Mouth Daily 4)  Tamsulosin Hcl 0.4 Mg Caps (Tamsulosin Hcl) .Marland Kitchen.. 1 By Mouth Once Daily 5)  Digoxin 0.125 Mg Tabs (Digoxin) .Marland Kitchen.. 1  Tablet By Mouth Daily 6)  Simvastatin 80 Mg Tabs (Simvastatin) .... 1/2  Tablet By Mouth Daily At Bedtime 7)  Plavix 75 Mg Tabs (Clopidogrel Bisulfate) .... Take One Tablet By Mouth Daily 8)  Pantoprazole Sodium 40 Mg Tbec (Pantoprazole Sodium) .Marland Kitchen.. 1 By Mouth Once Daily 9)  Sertraline Hcl 50 Mg Tabs (Sertraline Hcl) .Marland Kitchen.. 1 By Mouth Once Daily As Needed 10)  Nitrostat 0.4 Mg Subl (Nitroglycerin) .Marland Kitchen.. 1 Tablet Under Tongue At Onset of Chest Pain; You May Repeat Every 5 Minutes For Up To 3 Doses. 11)  Niacin Cr 500 Mg Cr-Caps (Niacin) .Marland Kitchen.. 1 By Mouth Once Daily 12)  Fish Oil 1000 Mg Caps (Omega-3 Fatty Acids) .Marland Kitchen.. 1 By Mouth Once Daily 13)  Hydrocodone-Acetaminophen 5-500 Mg Tabs (Hydrocodone-Acetaminophen) .... As Directed 14)  Tylenol 325 Mg Tabs (Acetaminophen) .... As Needed 15)  Aspirin 81 Mg Tbec (Aspirin) .... Take One Tablet By Mouth Daily  Allergies (verified): No Known Drug Allergies  Past History:  Past Medical History: Last updated: 06/21/2009  1. Near-syncopal spell secondary to slow ventricular tachycardia.   2. Known ischemic cardiomyopathy with an ejection fraction of 25-30%       by echocardiogram this admission.   3. Coronary artery disease, catheterization this admission revealing       occluded saphenous vein graft to right coronary artery, plan  is for       medical therapy.   4. Known coronary artery disease with previous bypass surgery in 1997       with saphenous vein graft to right coronary artery percutaneous       coronary intervention in February 2006, in-stent restenosis treated       with percutaneous coronary intervention in October 2010, now with       occluded saphenous vein graft to right coronary artery.   5. History of a Medtronic implantable cardioverter-defibrillator with        Sprint Fidelis lead in 2006.   6. Slightly abnormal thyroid studies, amiodarone was increased this       admission.   7. Treated dyslipidemia.   Past Surgical History: Last updated: 06/21/2009 CABG  Family History: Last updated: 06/21/2009  Mother had congestive heart failure, died at age 27.   Father died at age 2 of old age.  He has one brother living, he had  a  myocardial infarction at age 66,he had the subsequent coronary  artery  bypass graft surgery.   Social History: Last updated: 06/21/2009 The patient lives in [place] with his wife.  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  He did smoke about one  pack  per day for many years, quit in 2004 at the age of 32.  Does  not partake  of alcoholic beverages to any great extent.   Vital Signs:  Patient profile:   64 year old male Height:      68 inches Weight:      184.50 pounds BMI:     28.15 Pulse rate:   72 / minute Pulse rhythm:   regular BP sitting:   66 / 56  (left arm) Cuff size:   large  Vitals Entered By: Mercer Pod (July 23, 2009 10:50 AM)  Physical Exam  General:  Well developed, well nourished, in no acute distress. Head:  normal HEENT Neck:  Neck veins were flat; carotids are brisk and full Lungs:  clear to auscultation and percussion Heart:  regular rate and rhythm without murmurs or gallops Abdomen:  soft with active bowel sounds without middle pulsation or hepatomegaly Msk:  Back normal, normal gait. Muscle strength and tone normal. Pulses:  pulses normal in all 4 extremities; although diminished Neurologic:  Alert and oriented x 3. Skin:  warm and dry with good capillary refill Cervical Nodes:  no lymphadenopathy    ICD Specifications Following MD:  Sherryl Manges, MD     ICD Vendor:  Medtronic     ICD Model Number:  [id number]     ICD Serial Number:  [id number] ICD DOI:  06/17/2004     ICD Implanting MD:  NOT IMPLANTED BY [place]  Lead 1:    Location: RA     DOI: 06/17/2004     Model  #: 4098     Serial #: [id number]     Status: active Lead 2:    Location: RV     DOI: 06/17/2004     Model #: [id number]     Status: active  ICD Follow Up Remote Check?  No Battery Voltage:  2.85 V     Charge Time:  10.0 seconds     Underlying rhythm:  dependent ICD Dependent:  Yes       ICD Device Measurements Atrium:  Amplitude: 4.7 mV, Impedance: 536 ohms, Threshold: 1.0 V at 0.4 msec Right Ventricle:  Impedance: 576 ohms,  Threshold: 1.0 V at 0.4 msec Shock Impedance: 45/60 ohms   Episodes MS Episodes:  0     Percent Mode Switch:  0     Coumadin:  No Shock:  0     ATP:  1     ICD Appropriate Therapy?  Yes Atrial Pacing:  100%     Ventricular Pacing:  100%  Brady Parameters Mode DDDR     Lower Rate Limit:  70     Upper Rate Limit 110 PAV 280     Sensed AV Delay:  240 Rate Response Parameters:  Rate response 8, medium/low  Tachy Zones VF:  214     VT:  150     VT1:  130     Tech Comments:  RV 2.5@0 .4.  ROV 4 weeks with Dr. Graciela Husbands in [place]. Altha Harm, LPN  July 23, 2009 11:23 AM   Impression & Recommendations:  Problem # 1:  HYPOTENSION (ICD-458.9)  The patient has hypotension. I do not see evidence of Tamponade physiology on examination. We'll have to make a presumption that this is medication related. We'll continue to down titrate.  We'll also measure CBC and cortisol level looking for metabolic contributions to hypotension. I'll have him see Dr. [name] next week.  Orders: T- * Misc. Laboratory test [phone]) T-Comprehensive Metabolic Panel [phone]) T-CBC No Diff ([id number])  Problem # 2:  VENTRICULAR TACHYCARDIA (ICD-427.1)  There has been intercurrent ventricular tachycardia. However, there has been a overall general loss of activity the last 6 months concurrent with his amiodarone. There has been worsening dyspnea. He has  LFT abnormalities as well as TSH abnormalities and I think we need to stop the amiodarone. Alternatives would include antiarrhythmic therapy  and/or catheter ablation. For right now, given his hypotension, procedures are not really an option. we will repeat his liver tests thyroid studies as well as free T3 and free T4 today.  The following medications were removed from the medication list:    Pacerone 200 Mg Tabs (Amiodarone hcl) .Marland Kitchen... 2 by mouth once a day    Lisinopril 20 Mg Tabs (Lisinopril) .Marland Kitchen... Take one tablet by mouth daily His updated medication list for this problem includes:    Metoprolol Tartrate 100 Mg Tabs (Metoprolol tartrate) .Marland Kitchen... 1/4  tablet by mouth twice a day    Plavix 75 Mg Tabs (Clopidogrel bisulfate) .Marland Kitchen... Take one tablet by mouth daily    Nitrostat 0.4 Mg Subl (Nitroglycerin) .Marland Kitchen... 1 tablet under tongue at onset of chest pain; you may repeat every 5 minutes for up to 3 doses.    Aspirin 81 Mg Tbec (Aspirin) .Marland Kitchen... Take one tablet by mouth daily    Mexiletine Hcl 200 Mg Caps (Mexiletine hcl) .Marland Kitchen... 1 tab by mouth two times a day  Problem # 3:  SPRINT FIDELIS  LEAD (ICD-996.04) This will need to be revised at the time of device generator replacement and/or upgrade  Problem # 4:  ICD - IN SITU (ICD-V45.02) Device parameters and data were reviewed and no changes were made  he has had recurrent ventricular tachycardia treated with antitachycardia pacing. He also has far field ventricular sensing on his atrial lead which we did not program  around as to do so would need to decrease his atrial sensitivity 1.5 mV  Problem # 5:  DYSPNEA ON EXERTION (ICD-786.09)  hmultiple components of likely contributing. 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 Congestive heart failure in the setting of ventricular pacing his mother. We discussed CRT upgrade. This would be  my first procedural option. However, we need to feel why he is still hypotensive prior to considering this. At the time of CRT upgrade, we would need to revise the the Sprint fidelis lead.  we'll also check a BNP today  The following medications were removed from the  medication list:    Spironolactone 25 Mg Tabs (Spironolactone) .Marland Kitchen... 1/2  tablet by mouth daily    Lisinopril 20 Mg Tabs (Lisinopril) .Marland Kitchen... Take one tablet by mouth daily His updated medication list for this problem includes:    Metoprolol Tartrate 100 Mg Tabs (Metoprolol tartrate) .Marland Kitchen... 1/4  tablet by mouth twice a day    Digoxin 0.125 Mg Tabs (Digoxin) .Marland Kitchen... 1  tablet by mouth daily    Aspirin 81 Mg Tbec (Aspirin) .Marland Kitchen... Take one tablet by mouth daily  The following medications were removed from the medication list:    Spironolactone 25 Mg Tabs (Spironolactone) .Marland Kitchen... 1/2  tablet by mouth daily    Lisinopril 20 Mg Tabs (Lisinopril) .Marland Kitchen... Take one tablet by mouth daily His updated medication list for this problem includes:    Metoprolol Tartrate 100 Mg Tabs (Metoprolol tartrate) .Marland Kitchen... 1/4  tablet by mouth twice a day    Digoxin 0.125 Mg Tabs (Digoxin) .Marland Kitchen... 1  tablet by mouth daily    Aspirin 81 Mg Tbec (Aspirin) .Marland Kitchen... Take one tablet by mouth daily  Problem # 6:  TRANSAMINASES, SERUM, ELEVATED (ICD-790.4) history examination his are elevated. We will need to clear stopping both amiodarone and his statin. Right now we will to the former and replaced with mexiletine. Repeat will need to be undertaken in about 4 weeks. At that time a decision can be made to his statin  Other Orders: T-BNP  (B Natriuretic Peptide) [id number]) T-TSH [id number] T-T3, Free [phone]) T-T4, Free [phone]) Echocardiogram (Echo)  Patient Instructions: 1)  Your physician recommends that you schedule a follow-up appointment in: 1 week with Dr. [name] 2)  Your physician has recommended you make the following change in your medication: continue to hold lisinopril, stop pacerone, decrease metoprolol to 25 mg twice a day, stop spironolactone, start mexiletine 200 mg twice a day. 3)  Your physician has requested that you have an echocardiogram.  Echocardiography is a painless test that uses sound waves to  create images of your heart. It provides your doctor with information about the size and shape of your heart and how well your heart's chambers and valves are working.  This procedure takes approximately one hour. There are no restrictions for this procedure. Prescriptions: MEXILETINE HCL 200 MG CAPS (MEXILETINE HCL) 1 tab by mouth two times a day  #60 x 6   Entered by:   Charlena Cross, RN, BSN   Authorized by:   Nathen May, MD, [facility]   Signed by:   Charlena Cross, RN, BSN on 07/23/2009   Method used:   Electronically to        [facility] (retail)       [address], [address]       [place], [place]  [address]       Ph: [phone]       Fax: [phone]   RxID:   [id number]

## 2010-06-04 NOTE — Procedures (Signed)
Summary: PACER/AMD   Current Medications (verified): 1)  Carvedilol 12.5 Mg Tabs (Carvedilol) .... Take 1 Tablet By Mouth Two Times A Day 2)  Tamsulosin Hcl 0.4 Mg Caps (Tamsulosin Hcl) .Marland Kitchen.. 1 By Mouth Once Daily 3)  Digoxin 0.125 Mg Tabs (Digoxin) .Marland Kitchen.. 1  Tablet By Mouth Daily 4)  Plavix 75 Mg Tabs (Clopidogrel Bisulfate) .... Take One Tablet By Mouth Daily 5)  Nitrostat 0.4 Mg Subl (Nitroglycerin) .Marland Kitchen.. 1 Tablet Under Tongue At Onset of Chest Pain; You May Repeat Every 5 Minutes For Up To 3 Doses. 6)  Niacin Cr 500 Mg Cr-Caps (Niacin) .Marland Kitchen.. 1 By Mouth Once Daily 7)  Fish Oil 1000 Mg Caps (Omega-3 Fatty Acids) .Marland Kitchen.. 1 By Mouth Once Daily 8)  Hydrocodone-Acetaminophen 5-500 Mg Tabs (Hydrocodone-Acetaminophen) .... As Directed 9)  Tylenol 325 Mg Tabs (Acetaminophen) .... As Needed 10)  Aspirin 81 Mg Tbec (Aspirin) .... Take One Tablet By Mouth Daily 11)  Mexiletine Hcl 250 Mg Caps (Mexiletine Hcl) .... Take 1 By Mouth Two Times A Day 12)  Klor-Con 10 10 Meq Cr-Tabs (Potassium Chloride) .... Take 2 By Mouth Once Daily 13)  Furosemide 20 Mg Tabs (Furosemide) .... Take One Tablet By Mouth Daily. 14)  Crestor 10 Mg Tabs (Rosuvastatin Calcium) .... Take One Tablet By Mouth Daily.  Allergies (verified): No Known Drug Allergies    ICD Specifications Following MD:  Sherryl Manges, MD     ICD Vendor:  Medtronic     ICD Model Number:  [id number]     ICD Serial Number:  [id number] ICD DOI:  06/17/2004     ICD Implanting MD:  NOT IMPLANTED BY [place]  Lead 1:    Location: RA     DOI: 06/17/2004     Model #: 4098     Serial #: [id number]     Status: active Lead 2:    Location: RV     DOI: 06/17/2004     Model #: 5621     Status: active  Indications::  VT   ICD Follow Up Remote Check?  No Battery Voltage:  2.75 V     Charge Time:  10.0 seconds     Underlying rhythm:  Huston Foley ICD Dependent:  No       ICD Device Measurements Atrium:  Amplitude: 2.4 mV, Impedance: 544 ohms, Threshold: 1.0 V at 0.4 msec Right  Ventricle:  Amplitude: 5.7 mV, Impedance: 632 ohms, Threshold: 0.5 V at 0.4 msec Shock Impedance: 48/62 ohms   Episodes MS Episodes:  0     Percent Mode Switch:  0     Coumadin:  No Shock:  0     ATP:  0     Nonsustained:  0     Atrial Pacing:  95.5%     Ventricular Pacing:  31.9%  Brady Parameters Mode DDDR     Lower Rate Limit:  70     Upper Rate Limit 110 PAV 280     Sensed AV Delay:  240 Rate Response Parameters:  Rate response 8, medium/low  Tachy Zones VF:  214     VT:  150     VT1:  130     Next Cardiology Appt Due:  02/02/2010 Tech Comments:  1 SVT episode lasting 27 seconds EGM shows 1:1 conduction at a rate of136bpm.  6948 lead stable, SIC 0.  ROV 3 months [facility]. Altha Harm, LPN  November 28, 2009 8:44 AM  Prescriptions: TAMSULOSIN HCL 0.4 MG CAPS (TAMSULOSIN HCL) 1  by mouth once daily  #90 x 4   Entered by:   Benedict Needy, RN   Authorized by:   Dossie Arbour MD   Signed by:   Benedict Needy, RN on 11/28/2009   Method used:   Faxed to ...       [facility] (mail-order)             , [place]         Ph: [phone]       Fax: [phone]   RxID:   [id number] EFFIENT 10 MG TABS (PRASUGREL HCL) Take 1 tablet by mouth once a day  #90 x 4   Entered by:   Benedict Needy, RN   Authorized by:   Dossie Arbour MD   Signed by:   Benedict Needy, RN on 11/28/2009   Method used:   Faxed to ...       [facility] (mail-order)             , [place]         Ph: [phone]       Fax: [phone]   RxID:   [id number] EFFIENT 10 MG TABS (PRASUGREL HCL) Take 1 tablet by mouth once a day  #30 x 0   Entered by:   Benedict Needy, RN   Authorized by:   Dossie Arbour MD   Signed by:   Benedict Needy, RN on 11/28/2009   Method used:   Electronically to        [facility] [address] (retail)       [address]       [place], [place]  [address]       Ph: [phone]       Fax: [phone]   RxID:   [id number]

## 2010-06-04 NOTE — Assessment & Plan Note (Signed)
Summary: EC6/AMD   Visit Type:  Follow-up Referring Provider:  Anetra Czerwinski Primary Provider:  n/a  CC:  bp is all over the place, no cp, little short of breath, and no edema.  History of Present Illness: .Andrew [name] a history of paroxysmal atrial fibrillation.,  ventricular tachycardia occurring in the setting of ischemic heart disease prior bypass surgery in 1997,  PCI in 2006 and again in 2010 with EF of 30-40%, significant scar in the infero-posterior and apical region with akinesis, out of hospital arrest and underwent ICD implantation 2006. He presents for followup after recent visit with Dr. Graciela Husbands where his amiodarone was discontinued for hypotension and malaise.  Since the amiodarone was stopped, he feels well with less malaise. His blood pressure has improved. He states his weight has increased and he feels more short of breath. He was short of breath last night and noticed that his blood pressure was elevated to 160 systolic.  he has never noticed swelling in his lower extremities but does have some around his abdomen. He has not been very active over the past year and states he is deconditioned. As mentioned, his weight is up 6-7 pounds since his last visit with Dr. Graciela Husbands this month. his BNP was elevated in February though improved to normal on his last visit to the clinic though at that time his weight was 185. Current weight is 191.       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 1/4  Tablet By Mouth Twice A Day 2)  Tamsulosin Hcl 0.4 Mg Caps (Tamsulosin Hcl) .Marland Kitchen.. 1 By Mouth Once Daily 3)  Digoxin 0.125 Mg Tabs (Digoxin) .Marland Kitchen.. 1  Tablet By Mouth Daily 4)  Simvastatin 80 Mg Tabs (Simvastatin) .... 1/2  Tablet By Mouth Daily At Bedtime 5)  Plavix 75 Mg Tabs (Clopidogrel Bisulfate) .... Take One Tablet By Mouth Daily 6)  Pantoprazole Sodium 40 Mg Tbec (Pantoprazole Sodium) .Marland Kitchen.. 1 By Mouth Once Daily 7)  Sertraline Hcl 50 Mg Tabs (Sertraline Hcl) .Marland Kitchen.. 1 By Mouth Once Daily As Needed 8)  Nitrostat 0.4 Mg Subl (Nitroglycerin) .Marland Kitchen.. 1 Tablet Under Tongue At Onset of Chest Pain; You May Repeat Every 5 Minutes For Up To 3 Doses. 9)  Niacin Cr 500 Mg Cr-Caps (Niacin) .Marland Kitchen.. 1 By Mouth Once Daily 10)  Fish Oil 1000 Mg Caps (Omega-3 Fatty Acids) .Marland Kitchen.. 1 By Mouth Once Daily 11)  Hydrocodone-Acetaminophen 5-500 Mg Tabs (Hydrocodone-Acetaminophen) .... As Directed 12)  Tylenol 325 Mg Tabs (Acetaminophen) .... As Needed 13)  Aspirin 81 Mg Tbec (Aspirin) .... Take One Tablet By Mouth Daily 14)  Mexiletine Hcl 200 Mg Caps (Mexiletine Hcl) .Marland Kitchen.. 1 Tab By Mouth Two Times A Day  Allergies (verified): No Known Drug Allergies  Past History:  Past Medical History: Last updated: 06/21/2009  1. Near-syncopal spell secondary  to slow ventricular tachycardia.   2. Known ischemic cardiomyopathy with an ejection fraction of 25-30%       by echocardiogram this admission.   3. Coronary artery disease, catheterization this admission revealing       occluded saphenous vein graft to right coronary artery, plan is for       medical therapy.   4. Known coronary artery disease with previous bypass surgery in 1997       with saphenous vein graft to right coronary artery percutaneous        coronary intervention in February 2006, in-stent restenosis treated       with percutaneous coronary intervention in October 2010, now with       occluded saphenous vein graft to right coronary artery.   5. History of a Medtronic implantable cardioverter-defibrillator with       Sprint Fidelis lead in 2006.   6. Slightly abnormal thyroid studies, amiodarone was increased this       admission.   7. Treated dyslipidemia.   Past Surgical History: Last updated: 06/21/2009 CABG  Family History: Last updated: 06/21/2009  Mother had congestive heart failure, died at age 65.   Father died at age 17 of old age.  He has one brother living, he had  a  myocardial infarction at age 33,he had the subsequent coronary  artery  bypass graft surgery.   Social History: Last updated: 06/21/2009 The patient lives in [place] with his wife.  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  He did smoke about one  pack  per day for many years, quit in 2004 at the age of 18.  Does  not partake  of alcoholic beverages to any great extent.   Review of Systems       The patient complains of weight gain and dyspnea on exertion.  The patient denies fever, weight loss, vision loss, decreased hearing, hoarseness, chest pain, syncope, peripheral edema, prolonged cough, abdominal pain, incontinence, muscle weakness, depression, and enlarged lymph nodes.    Vital Signs:  Patient profile:   65 year old male Height:      68 inches Weight:      191 pounds BMI:     29.15 Pulse rate:   80 / minute Pulse rhythm:   regular BP sitting:   136 / 86  (left arm) Cuff size:   large  Vitals Entered By: Mercer Pod (August 02, 2009 1:53 PM)  Physical Exam  General:  well-appearing middle-aged gentleman in no apparent distress, HEENT exam is benign, neck is supple with no JVP or carotid bruits, heart sounds are regular with S1-S2 and no murmurs appreciated, lungs are clear to auscultation with no wheezes or  rales, abdominal exam is benign, no significant lower extremity edema, neurologic exam is grossly nonfocal, skin is warm and dry.    ICD Specifications Following MD:  Sherryl Manges, MD     ICD Vendor:  Medtronic     ICD Model Number:  [id number]     ICD Serial Number:  [id number] ICD DOI:  06/17/2004     ICD Implanting MD:  NOT IMPLANTED BY [place]  Lead 1:    Location: RA     DOI: 06/17/2004     Model #: [id number]     Serial #: [id number]     Status: active Lead 2:    Location: RV     DOI: 06/17/2004     Model #: 5621  Status: active  ICD Follow Up ICD Dependent:  Yes      Episodes Coumadin:  No  Brady Parameters Mode DDDR     Lower Rate Limit:  70     Upper Rate Limit 110 PAV 280     Sensed AV Delay:  240 Rate Response Parameters:  Rate response 8, medium/low  Tachy Zones VF:  214     VT:  150     VT1:  130     Impression & Recommendations:  Problem # 1:  CARDIOMYOPATHY, ISCHEMIC (ICD-414.8) his weight is up concerning for mild CHF. We will start him on Lasix 20 mg daily with supplemental potassium  in an effort to get him back to his baseline weight of 185.  His ejection fraction is 35% and we will also change him from metoprolol to Coreg 6.25 mg b.i.d. hopefully titrating up to 12.5 mg if tolerated He is on mexiletine 200 mg b.i.d. and this not commented on any palpitations concerning for VT.  I am concerned about his cardiac conditioning and believe he needs cardiac rehabilitation. We will have him sign up for a [facility] cardiac rehabilitation given his large inferior infarct.  His updated medication list for this problem includes:    Carvedilol 6.25 Mg Tabs (Carvedilol) .Marland Kitchen... Take one tablet by mouth twice a day    Digoxin 0.125 Mg Tabs (Digoxin) .Marland Kitchen... 1  tablet by mouth daily    Plavix 75 Mg Tabs (Clopidogrel bisulfate) .Marland Kitchen... Take one tablet by mouth daily    Nitrostat 0.4 Mg Subl (Nitroglycerin) .Marland Kitchen... 1 tablet under tongue at onset of chest pain; you may repeat every 5 minutes for up to  3 doses.    Aspirin 81 Mg Tbec (Aspirin) .Marland Kitchen... Take one tablet by mouth daily    Mexiletine Hcl 200 Mg Caps (Mexiletine hcl) .Marland Kitchen... 1 tab by mouth two times a day    Furosemide 20 Mg Tabs (Furosemide) .Marland Kitchen... Take one tablet by mouth daily.  Problem # 2:  CAD, ARTERY BYPASS GRAFT (ICD-414.04) He currently takes simvastatin 40 mg daily, niacin and fish oil. We do not have a cholesterol panel for him and we'll obtain one from his primary care physician. If one is not available, we will have him complete one in the office.  His updated medication list for this problem includes:    Carvedilol 6.25 Mg Tabs (Carvedilol) .Marland Kitchen... Take one tablet by mouth twice a day    Plavix 75 Mg Tabs (Clopidogrel bisulfate) .Marland Kitchen... Take one tablet by mouth daily    Nitrostat 0.4 Mg Subl (Nitroglycerin) .Marland Kitchen... 1 tablet under tongue at onset of chest pain; you may repeat every 5 minutes for up to 3 doses.    Aspirin 81 Mg Tbec (Aspirin) .Marland Kitchen... Take one tablet by mouth daily  Patient Instructions: 1)  Your physician recommends that you schedule a follow-up appointment in: May 2)  Your physician has recommended you make the following change in your medication: stop metoprolol, start coreg 6.25 mg twice daily, start lasix daily until you are at your goal weight then take as needed, take 1 potassium tablet with lasix 3)  Your physician recommends referral and attendance at a Cardiac Rehab Program. 4)  Your physician has requested that you regularly monitor and record your blood pressure readings at home.  Please use the same machine at the same time of day to check your readings and record them to bring to your follow-up visit. Prescriptions: FUROSEMIDE 20 MG TABS (FUROSEMIDE) Take one  tablet by mouth daily.  #30 x 6   Entered by:   Charlena Cross, RN, BSN   Authorized by:   Dossie Arbour MD   Signed by:   Charlena Cross, RN, BSN on 08/02/2009   Method used:   Electronically to        [facility] (retail)        [address], [address]       [place], [place]  [address]       Ph: [phone]       Fax: [phone]   RxID:   [id number] KLOR-CON M20 20 MEQ CR-TABS (POTASSIUM CHLORIDE CRYS CR) 1 tab daily  #30 x 3   Entered by:   Charlena Cross, RN, BSN   Authorized by:   Dossie Arbour MD   Signed by:   Charlena Cross, RN, BSN on 08/02/2009   Method used:   Electronically to        [facility] (retail)       [address], [address]       [place], [place]  [address]       Ph: [phone]       Fax: [phone]   RxID:   [id number] CARVEDILOL 6.25 MG TABS (CARVEDILOL) Take one tablet by mouth twice a day  #60 x 3   Entered by:   Charlena Cross, RN, BSN   Authorized by:   Dossie Arbour MD   Signed by:   Charlena Cross, RN, BSN on 08/02/2009   Method used:   Electronically to        [facility] [address] (retail)       [address], [address]       [place], [place]  [address]       Ph: [phone]       Fax: [phone]   RxID:   [id number]

## 2010-06-04 NOTE — Assessment & Plan Note (Signed)
Summary: ROV/AMD   Referring Provider:  Rolena Knutson Primary Provider:  n/a  CC:  s.o.b. when climbing 7 steps and occa. dizziness.  History of Present Illness: .Mr. Henken a history of paroxysmal atrial fibrillation.,  ventricular tachycardia occurring in the setting of ischemic heart disease, prior bypass surgery in 1997,  PCI in 2006 and again in 2010 with EF of 30-40%, significant scar in the infero-posterior and apical region with akinesis, out of hospital arrest and underwent ICD implantation 2006. He presents for followup .   overall, he feels very fatigued. He blames the medications are making him feel this way. His sleep is okay, appetite is reasonable. He denies any significant edema, shortness of breath with exertion. His complaints are mostly with his energy level. He does not do an exercise plan he did complete cardiac rehabilitation recently. He was not able to continue the continuing exercise routine at the hospital due to the cost. He has been restarted on Crestor 10 mg after his LFTs came back to normal.   Cardiac catheter in January 2011 showed a normal left main, 90% LAD stenosis proximally after the takeoff of a large septal perforator, occluded circumflex after the OM 2, second OM with a long 90% proximal lesion and occluded in its midportion, RCA totally occluded proximally, vein graft to the circumflex marginal branch noted to be occluded, occlusion of his vein graft to the RCA, patent LIMA to the LAD with left to right collaterals. Medical management was recommended.          Current Medications (verified): 1)  Carvedilol 6.25 Mg Tabs (Carvedilol) .... Take One Tablet By Mouth Twice A Day 2)  Tamsulosin Hcl 0.4 Mg Caps (Tamsulosin Hcl) .Marland Kitchen.. 1 By Mouth Once Daily 3)  Digoxin 0.125 Mg Tabs (Digoxin) .Marland Kitchen.. 1  Tablet By Mouth Daily 4)  Plavix 75 Mg Tabs (Clopidogrel Bisulfate) .... Take One Tablet By Mouth Daily 5)  Pantoprazole Sodium 40 Mg Tbec (Pantoprazole Sodium) .Marland Kitchen..  1 By Mouth Once Daily 6)  Nitrostat 0.4 Mg Subl (Nitroglycerin) .Marland Kitchen.. 1 Tablet Under Tongue At Onset of Chest Pain; You May Repeat Every 5 Minutes For Up To 3 Doses. 7)  Niacin Cr 500 Mg Cr-Caps (Niacin) .Marland Kitchen.. 1 By Mouth Once Daily 8)  Fish Oil 1000 Mg Caps (Omega-3 Fatty Acids) .Marland Kitchen.. 1 By Mouth Once Daily 9)  Hydrocodone-Acetaminophen 5-500 Mg Tabs (Hydrocodone-Acetaminophen) .... As Directed 10)  Tylenol 325 Mg Tabs (Acetaminophen) .... As Needed 11)  Aspirin 81 Mg Tbec (Aspirin) .... Take One Tablet By Mouth Daily 12)  Mexiletine Hcl 250 Mg Caps (Mexiletine Hcl) .... Take 1 By Mouth Two Times A Day 13)  Klor-Con 10 10 Meq Cr-Tabs (Potassium Chloride) .... Take 2 By Mouth Once Daily 14)  Furosemide 20 Mg Tabs (Furosemide) .... Take One Tablet By Mouth Daily. 15)  Crestor 10 Mg Tabs (Rosuvastatin Calcium) .... Take One Tablet By Mouth Daily.  Allergies (verified): No Known Drug Allergies  Past History:  Past Medical History: Last updated: 06/21/2009  1. Near-syncopal spell secondary to slow ventricular tachycardia.   2. Known ischemic cardiomyopathy with an ejection fraction of 25-30%       by echocardiogram this admission.   3. Coronary artery disease, catheterization this admission revealing       occluded saphenous vein graft to right coronary artery, plan is for       medical therapy.   4. Known coronary artery disease with previous bypass surgery in 1997  with saphenous vein graft to right coronary artery percutaneous       coronary intervention in February 2006, in-stent restenosis treated       with percutaneous coronary intervention in October 2010, now with       occluded saphenous vein graft to right coronary artery.   5. History of a Medtronic implantable cardioverter-defibrillator with       S[employer] Fidel[employer] lead in 2006.   6. Slightly abnormal thyroid studies, amiodarone was increased this       admission.   7. Treated dyslipidemia.   Past Surgical History: Last  updated: 06/21/2009 CABG  Family History: Last updated: 06/21/2009  Mother had congestive heart failure, died at age 73.   Father died at age 28 of old age.  He has one brother living, he had  a  myocardial infarction at age 11,he had the subsequent coronary  artery  bypass graft surgery.   Social History: Last updated: 06/21/2009 The patient lives in [place] with his wife.  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  He did smoke about one  pack  per day for many years, quit in 2004 at the age of 54.  Does  not partake  of alcoholic beverages to any great extent.   Review of Systems  The patient denies fever, weight loss, weight gain, vision loss, decreased hearing, hoarseness, chest pain, syncope, dyspnea on exertion, peripheral edema, prolonged cough, abdominal pain, incontinence, muscle weakness, depression, and enlarged lymph nodes.         fatigue, malaise  Vital Signs:  Patient profile:   65 year old male Height:      68 inches Weight:      182 pounds Pulse rate:   74 / minute BP sitting:   126 / 78  (left arm) Cuff size:   regular  Vitals Entered By: Bishop Dublin, CMA (October 22, 2009 10:39 AM)  Physical Exam  General:  Well developed, well nourished, in no acute distress. Head:  normocephalic and atraumatic Neck:  Neck supple, no JVD. No masses, thyromegaly or abnormal cervical nodes. Lungs:  Clear bilaterally to auscultation and percussion. Heart:  Non-displaced PMI, chest non-tender; regular rate and rhythm, S1, S2 without murmurs, rubs or gallops. Carotid upstroke normal, no bruit. Pedals normal pulses. No edema, no varicosities. Abdomen:  Bowel sounds positive; abdomen soft and non-tender without masses Msk:  Back normal, normal gait. Muscle strength and tone normal. Pulses:  pulses normal in all 4 extremities Extremities:  No clubbing or cyanosis. Neurologic:  Alert and oriented x 3. Skin:  Intact without lesions or rashes. Psych:  Normal  affect.    ICD Specifications Following MD:  Sherryl Manges, MD     ICD Vendor:  Medtronic     ICD Model Number:  [id number]     ICD Serial Number:  [id number] ICD DOI:  06/17/2004     ICD Implanting MD:  NOT IMPLANTED BY [place]  Lead 1:    Location: RA     DOI: 06/17/2004     Model #: [id number]     Serial #: [id number]     Status: active Lead 2:    Location: RV     DOI: 06/17/2004     Model #: [id number]     Status: active  ICD Follow Up ICD Dependent:  Yes      Episodes Coumadin:  No  Brady Parameters Mode DDDR     Lower Rate Limit:  70  Upper Rate Limit 110 PAV 280     Sensed AV Delay:  240 Rate Response Parameters:  Rate response 8, medium/low  Tachy Zones VF:  214     VT:  150     VT1:  130     Impression & Recommendations:  Problem # 1:  CAD, ARTERY BYPASS GRAFT (ICD-414.04) severe coronary artery disease as detailed with ischemic cardiomyopathy, ejection fraction 25-30%. ICD placed, history of ventricular arrhythmia, followed by Dr.Klein. Given his ventricular arrhythmia, we will increase his Coreg to 12.5 mg b.i.d. He is back on Crestor 10 mg daily now that his LFTs are normal, off amiodarone We will recheck a lipid panel and LFTs in 3 months time  His updated medication list for this problem includes:    Carvedilol 12.5 Mg Tabs (Carvedilol) .Marland Kitchen... Take 1 tablet by mouth two times a day    Plavix 75 Mg Tabs (Clopidogrel bisulfate) .Marland Kitchen... Take one tablet by mouth daily    Nitrostat 0.4 Mg Subl (Nitroglycerin) .Marland Kitchen... 1 tablet under tongue at onset of chest pain; you may repeat every 5 minutes for up to 3 doses.    Aspirin 81 Mg Tbec (Aspirin) .Marland Kitchen... Take one tablet by mouth daily  Problem # 2:  ATRIAL FIBRILLATION-PAROXYSMAL (ICD-427.31) Notes indicate no significant atrial fibrillation on his current medical regiment.  His updated medication list for this problem includes:    Carvedilol 12.5 Mg Tabs (Carvedilol) .Marland Kitchen... Take 1 tablet by mouth two times a day    Digoxin 0.125 Mg Tabs  (Digoxin) .Marland Kitchen... 1  tablet by mouth daily    Plavix 75 Mg Tabs (Clopidogrel bisulfate) .Marland Kitchen... Take one tablet by mouth daily    Aspirin 81 Mg Tbec (Aspirin) .Marland Kitchen... Take one tablet by mouth daily    Mexiletine Hcl 250 Mg Caps (Mexiletine hcl) .Marland Kitchen... Take 1 by mouth two times a day  Problem # 3:  DYSPNEA ON EXERTION (ICD-786.09) His dyspnea is likely multifactorial as he does not exercise, has an ischemic cardiomyopathy. I'm unsure if he has some mild depression, poor sleep. I encouraged him to increase his exercise No signs of fluid overload on clinical exam today.  His updated medication list for this problem includes:    Carvedilol 12.5 Mg Tabs (Carvedilol) .Marland Kitchen... Take 1 tablet by mouth two times a day    Digoxin 0.125 Mg Tabs (Digoxin) .Marland Kitchen... 1  tablet by mouth daily    Aspirin 81 Mg Tbec (Aspirin) .Marland Kitchen... Take one tablet by mouth daily    Furosemide 20 Mg Tabs (Furosemide) .Marland Kitchen... Take one tablet by mouth daily.  Patient Instructions: 1)  Your physician has recommended you make the following change in your medication: Increase carvedilol to 12.5 two times a day, Crestor 10mg  daily 2)  Your physician wants you to follow-up in:   6 months You will receive a reminder letter in the mail two months in advance. If you don't receive a letter, please call our office to schedule the follow-up appointment. Prescriptions: CRESTOR 10 MG TABS (ROSUVASTATIN CALCIUM) Take one tablet by mouth daily.  #30 x 6   Entered by:   Benedict Needy, RN   Authorized by:   Dossie Arbour MD   Signed by:   Benedict Needy, RN on 10/22/2009   Method used:   Print then Give to Patient   RxID:   [id number] CARVEDILOL 12.5 MG TABS (CARVEDILOL) Take 1 tablet by mouth two times a day  #60 x 6   Entered by:   Benedict Needy,  RN   Authorized by:   Dossie Arbour MD   Signed by:   Benedict Needy, RN on 10/22/2009   Method used:   Electronically to        [facility]  [address] (retail)       [address]       [place], [place]  [address]       Ph: [phone]       Fax: [phone]   RxID:   [id number]

## 2010-06-04 NOTE — Progress Notes (Signed)
Summary: Called Pt  Phone Note Outgoing Call Call back at [facility] Phone [phone]   Call placed by: Harlon Flor,  January 10, 2010 9:27 AM Call placed to: Patient Summary of Call: [facility] to schedule lab appt Initial call taken by: Harlon Flor,  January 10, 2010 9:27 AM

## 2010-06-06 NOTE — Progress Notes (Signed)
Summary: Defib Fired  Phone Note Call from Patient Call back at [facility] Phone [phone] Call back at [phone]   Caller: Wife Andrew [name]) Call For: [name] Summary of Call: Defibrillator just fired.  Pt is going to [facility].  Is this appropriate? # [phone] Initial call taken by: Harlon Flor,  May 29, 2010 3:47 PM  Follow-up for Phone Call        Spoke to pt's wife, they are at [facility] now. Pt is feeling fine and ICD fired once. ER MD in room at present. Follow-up by: Lanny Hurst RN,  May 29, 2010 5:19 PM

## 2010-06-06 NOTE — Assessment & Plan Note (Signed)
Summary: F6M/AMD   Visit Type:  Follow-up Referring Provider:  gollan Primary Provider:  Dr. Mordecai Maes [facility])  CC:  Had several spells of a twinge in chest for the past couple of weeks. Marland Kitchen  History of Present Illness: [name] has a history of paroxysmal atrial fibrillation,  ventricular tachycardia occurring in the setting of ischemic heart disease, prior bypass surgery in 1997,  PCI in 2006 and again in 2010 with EF of 30-40%, significant scar in the infero-posterior and apical region with akinesis, out of hospital arrest and underwent ICD implantation 2006. He presents for routine followup.  Andrew [name] states that he feels well but does have occasional episodes of mild chest pain lasting for 3-4 minutes at a time. On average they are a 1/10. Rarely he has 2-3/10 chest discomfort at nighttime. Typically does not get worse with exertion. Denies any edema, cough, headache, lightheadedness. Otherwise is in good spirits. He attributes his chest discomfort to his wife who causes significant stress in his life.  He recently has established care at the [facility] with Dr. Mordecai Maes in [place] and has percent coverage for agent orange.   Cardiac catheter in January 2011 showed a normal left main, 90% LAD stenosis proximally after the takeoff of a large septal perforator, occluded circumflex after the OM 2, second OM with a long 90% proximal lesion and occluded in its midportion, RCA totally occluded proximally, vein graft to the circumflex marginal branch noted to be occluded, occlusion of his vein graft to the RCA, patent LIMA to the LAD with left to right collaterals. Medical management was recommended.          Current Medications (verified): 1)  Carvedilol 12.5 Mg Tabs (Carvedilol) .... Take 1 Tablet By Mouth Two Times A Day 2)  Tamsulosin Hcl 0.4 Mg Caps (Tamsulosin Hcl) .Marland Kitchen.. 1 By Mouth Once Daily 3)  Digoxin 0.125 Mg Tabs (Digoxin) .Marland Kitchen.. 1  Tablet By Mouth Daily 4)  Effient 10 Mg Tabs  (Prasugrel Hcl) .... Take 1 Tablet By Mouth Once A Day 5)  Nitrostat 0.4 Mg Subl (Nitroglycerin) .Marland Kitchen.. 1 Tablet Under Tongue At Onset of Chest Pain; You May Repeat Every 5 Minutes For Up To 3 Doses. 6)  Niacin Cr 500 Mg Cr-Caps (Niacin) .Marland Kitchen.. 1 By Mouth Once Daily 7)  Fish Oil 1000 Mg Caps (Omega-3 Fatty Acids) .Marland Kitchen.. 1 By Mouth Once Daily 8)  Hydrocodone-Acetaminophen 5-500 Mg Tabs (Hydrocodone-Acetaminophen) .... As Directed 9)  Tylenol 325 Mg Tabs (Acetaminophen) .... As Needed 10)  Aspirin 81 Mg Tbec (Aspirin) .... Take One Tablet By Mouth Daily 11)  Mexiletine Hcl 250 Mg Caps (Mexiletine Hcl) .... Take 1 By Mouth Two Times A Day 12)  Klor-Con 10 10 Meq Cr-Tabs (Potassium Chloride) .... Take 2 By Mouth Once Daily 13)  Furosemide 20 Mg Tabs (Furosemide) .... Take One Tablet By Mouth Daily. 14)  Crestor 10 Mg Tabs (Rosuvastatin Calcium) .... Take 1/2  Tablet By Mouth Daily.  Allergies (verified): No Known Drug Allergies  Past History:  Past Medical History: Last updated: 06/21/2009  1. Near-syncopal spell secondary to slow ventricular tachycardia.   2. Known ischemic cardiomyopathy with an ejection fraction of 25-30%       by echocardiogram this admission.   3. Coronary artery disease, catheterization this admission revealing       occluded saphenous vein graft to right coronary artery, plan is for       medical therapy.   4. Known coronary artery disease with  previous bypass surgery in 1997       with saphenous vein graft to right coronary artery percutaneous       coronary intervention in February 2006, in-stent restenosis treated       with percutaneous coronary intervention in October 2010, now with       occluded saphenous vein graft to right coronary artery.   5. History of a Medtronic implantable cardioverter-defibrillator with       S[employer] Fidel[employer] lead in 2006.   6. Slightly abnormal thyroid studies, amiodarone was increased this       admission.   7. Treated dyslipidemia.    Past Surgical History: Last updated: 06/21/2009 CABG  Family History: Last updated: 06/21/2009  Mother had congestive heart failure, died at age 46.   Father died at age 22 of old age.  He has one brother living, he had  a  myocardial infarction at age 84,he had the subsequent coronary  artery  bypass graft surgery.   Social History: Last updated: 06/21/2009 The patient lives in [place] with his wife.  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  He did smoke about one  pack  per day for many years, quit in 2004 at the age of 54.  Does  not partake  of alcoholic beverages to any great extent.   Review of Systems  The patient denies fever, weight loss, weight gain, vision loss, decreased hearing, hoarseness, chest pain, syncope, dyspnea on exertion, peripheral edema, prolonged cough, abdominal pain, incontinence, muscle weakness, depression, and enlarged lymph nodes.    Vital Signs:  Patient profile:   65 year old male Height:      68 inches Weight:      185 pounds BMI:     28.23 Pulse rate:   73 / minute BP sitting:   122 / 80  (left arm) Cuff size:   large  Vitals Entered By: Bishop Dublin, CMA (April 23, 2010 9:49 AM)  Physical Exam  General:  Well developed, well nourished, in no acute distress. Head:  normocephalic and atraumatic Neck:  Neck supple, no JVD. No masses, thyromegaly or abnormal cervical nodes. Lungs:  Clear bilaterally to auscultation and percussion. Heart:  Non-displaced PMI, chest non-tender; regular rate and rhythm, S1, S2 without murmurs, rubs or gallops. Carotid upstroke normal, no bruit.  Pedals normal pulses. No edema, no varicosities. Msk:  Back normal, normal gait. Muscle strength and tone normal. Pulses:  pulses normal in all 4 extremities Extremities:  No clubbing or cyanosis. Neurologic:  Alert and oriented x 3. Skin:  Intact without lesions or rashes. Psych:  Normal affect.    ICD Specifications Following MD:  Sherryl Manges,  MD     ICD Vendor:  Medtronic     ICD Model Number:  [id number]     ICD Serial Number:  [id number] ICD DOI:  06/17/2004     ICD Implanting MD:  NOT IMPLANTED BY [place]  Lead 1:    Location: RA     DOI: 06/17/2004     Model #: [id number]     Serial #: [id number]     Status: active Lead 2:    Location: RV     DOI: 06/17/2004     Model #: [id number]     Status: active  Indications::  VT   ICD Follow Up ICD Dependent:  No      Episodes Coumadin:  No  Andrew [name] Foley Parameters Mode DDDR     Lower  Rate Limit:  70     Upper Rate Limit 110 PAV 280     Sensed AV Delay:  240 Rate Response Parameters:  Rate response 8, medium/low  Tachy Zones VF:  214     VT:  150     VT1:  130     Impression & Recommendations:  Problem # 1:  CAD, ARTERY BYPASS GRAFT (ICD-414.04) severe CAD. Appears relatively stable though he does have low-grade occasional chest discomfort concerning for angina. We will start him on isosorbide mononitrate either daily or as needed. I have counseled him on stress reduction techniques. Encouraged continued exercise. We'll continue aspirin and effient.  His updated medication list for this problem includes:    Carvedilol 12.5 Mg Tabs (Carvedilol) .Marland Kitchen... Take 1 tablet by mouth two times a day    Effient 10 Mg Tabs (Prasugrel hcl) .Marland Kitchen... Take 1 tablet by mouth once a day    Nitrostat 0.4 Mg Subl (Nitroglycerin) .Marland Kitchen... 1 tablet under tongue at onset of chest pain; you may repeat every 5 minutes for up to 3 doses.    Aspirin 81 Mg Tbec (Aspirin) .Marland Kitchen... Take one tablet by mouth daily    Isosorbide Mononitrate Cr 30 Mg Xr24h-tab (Isosorbide mononitrate) .Marland Kitchen... Take one tablet by mouth  as needed for chest pain  Problem # 2:  HYPERCHOLESTEROLEMIA  IIA (ICD-272.0) Cholesterol on 10 mg was exceptionally low. We cut back to 5 mg given history of elevated LFTs. I suggested we check his cholesterol in several months time or have this checked at the [facility].  His updated medication list for this problem  includes:    Niacin Cr 500 Mg Cr-caps (Niacin) .Marland Kitchen... 1 by mouth once daily    Crestor 10 Mg Tabs (Rosuvastatin calcium) .Marland Kitchen... Take 1/2  tablet by mouth daily.  Problem # 3:  VENTRICULAR TACHYCARDIA (ICD-427.1) No evidence with his defibrillator by his report. No lightheadedness or dizziness. Continue beta blocker.  His updated medication list for this problem includes:    Carvedilol 12.5 Mg Tabs (Carvedilol) .Marland Kitchen... Take 1 tablet by mouth two times a day    Effient 10 Mg Tabs (Prasugrel hcl) .Marland Kitchen... Take 1 tablet by mouth once a day    Nitrostat 0.4 Mg Subl (Nitroglycerin) .Marland Kitchen... 1 tablet under tongue at onset of chest pain; you may repeat every 5 minutes for up to 3 doses.    Aspirin 81 Mg Tbec (Aspirin) .Marland Kitchen... Take one tablet by mouth daily    Mexiletine Hcl 250 Mg Caps (Mexiletine hcl) .Marland Kitchen... Take 1 by mouth two times a day    Isosorbide Mononitrate Cr 30 Mg Xr24h-tab (Isosorbide mononitrate) .Marland Kitchen... 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 Continue medical management.  His updated medication list for this problem includes:    Carvedilol 12.5 Mg Tabs (Carvedilol) .Marland Kitchen... Take 1 tablet by mouth two times a day    Digoxin 0.125 Mg Tabs (Digoxin) .Marland Kitchen... 1  tablet by mouth daily    Effient 10 Mg Tabs (Prasugrel hcl) .Marland Kitchen... Take 1 tablet by mouth once a day    Nitrostat 0.4 Mg Subl (Nitroglycerin) .Marland Kitchen... 1 tablet under tongue at onset of chest pain; you may repeat every 5 minutes for up to 3 doses.    Aspirin 81 Mg Tbec (Aspirin) .Marland Kitchen... Take one tablet by mouth daily    Mexiletine Hcl 250  Mg Caps (Mexiletine hcl) .Marland Kitchen... Take 1 by mouth two times a day    Furosemide 20 Mg Tabs (Furosemide) .Marland Kitchen... Take one tablet by mouth daily.    Isosorbide Mononitrate Cr 30 Mg Xr24h-tab (Isosorbide mononitrate) .Marland Kitchen... Take one tablet by mouth  as needed for chest pain  Patient  Instructions: 1)  Your physician recommends that you schedule a follow-up appointment in: 6 months 2)  Your physician has recommended you make the following change in your medication: Start taking Imdur 30mg  once daily as needed for chest pain. Prescriptions: ISOSORBIDE MONONITRATE CR 30 MG XR24H-TAB (ISOSORBIDE MONONITRATE) Take one tablet by mouth  as needed for chest pain  #30 x 6   Entered by:   Cloyde Reams RN   Authorized by:   Dossie Arbour MD   Signed by:   Cloyde Reams RN on 04/23/2010   Method used:   Faxed to ...       [facility] (mail-order)             , [place]         Ph: [phone]       Fax: [phone]   RxID:   [id number]   Appended Document: F6M/AMD Normal sinus rhythm with right bundle branch block, left axis deviation, lateral wall infarct

## 2010-06-06 NOTE — Letter (Signed)
Summary: [facility]  [facility]   Imported By: Marylou Mccoy 05/16/2010 13:06:13  _____________________________________________________________________  External Attachment:    Type:   Image     Comment:   External Document

## 2010-06-13 ENCOUNTER — Encounter: Payer: Self-pay | Admitting: Cardiovascular Disease

## 2010-06-13 ENCOUNTER — Encounter: Payer: Self-pay | Admitting: Internal Medicine

## 2010-06-13 ENCOUNTER — Encounter (INDEPENDENT_AMBULATORY_CARE_PROVIDER_SITE_OTHER): Payer: Managed Care, Other (non HMO)

## 2010-06-13 DIAGNOSIS — I472 Ventricular tachycardia: Secondary | ICD-10-CM

## 2010-06-20 ENCOUNTER — Telehealth: Payer: Self-pay | Admitting: Internal Medicine

## 2010-06-20 NOTE — Procedures (Signed)
Summary: PACER/AMD/NR   Current Medications (verified): 1)  Carvedilol 12.5 Mg Tabs (Carvedilol) .... Take 1 Tablet By Mouth Two Times A Day 2)  Tamsulosin Hcl 0.4 Mg Caps (Tamsulosin Hcl) .Marland Kitchen.. 1 By Mouth Once Daily 3)  Digoxin 0.125 Mg Tabs (Digoxin) .Marland Kitchen.. 1  Tablet By Mouth Daily 4)  Effient 10 Mg Tabs (Prasugrel Hcl) .... Take 1 Tablet By Mouth Once A Day 5)  Nitrostat 0.4 Mg Subl (Nitroglycerin) .Marland Kitchen.. 1 Tablet Under Tongue At Onset of Chest Pain; You May Repeat Every 5 Minutes For Up To 3 Doses. 6)  Niacin Cr 500 Mg Cr-Caps (Niacin) .Marland Kitchen.. 1 By Mouth Once Daily 7)  Fish Oil 1000 Mg Caps (Omega-3 Fatty Acids) .Marland Kitchen.. 1 By Mouth Once Daily 8)  Hydrocodone-Acetaminophen 5-500 Mg Tabs (Hydrocodone-Acetaminophen) .... As Directed 9)  Tylenol 325 Mg Tabs (Acetaminophen) .... As Needed 10)  Aspirin 81 Mg Tbec (Aspirin) .... Take One Tablet By Mouth Daily 11)  Mexiletine Hcl 250 Mg Caps (Mexiletine Hcl) .... Take 1 By Mouth Two Times A Day 12)  Klor-Con 10 10 Meq Cr-Tabs (Potassium Chloride) .... Take 2 By Mouth Once Daily 13)  Furosemide 20 Mg Tabs (Furosemide) .... Take One Tablet By Mouth Daily. 14)  Crestor 10 Mg Tabs (Rosuvastatin Calcium) .... Take 1/2  Tablet By Mouth Daily.  Allergies (verified): No Known Drug Allergies    ICD Specifications Following MD:  Sherryl Manges, MD     ICD Vendor:  Medtronic     ICD Model Number:  [id number]     ICD Serial Number:  [id number] ICD DOI:  06/17/2004     ICD Implanting MD:  NOT IMPLANTED BY [place]  Lead 1:    Location: RA     DOI: 06/17/2004     Model #: [id number]     Serial #: [id number]     Status: active Lead 2:    Location: RV     DOI: 06/17/2004     Model #: [id number]     Status: active  Indications::  VT   ICD Follow Up Remote Check?  No Battery Voltage:  2.64 V     Charge Time:  11.5 seconds     Underlying rhythm:  SR ICD Dependent:  No       ICD Device Measurements Atrium:  Amplitude: 3.3 mV, Impedance: 536 ohms, Threshold: 0.5 V at 0.4 msec Right  Ventricle:  Amplitude: 4.7 mV, Impedance: 616 ohms, Threshold: 0.5 V at 0.4 msec Shock Impedance: 47/60 ohms   Episodes MS Episodes:  0     Percent Mode Switch:  0     Coumadin:  No Shock:  0     ATP:  1     Nonsustained:  0     Atrial Pacing:  90.5%     Ventricular Pacing:  0.2%  Brady Parameters Mode DDDR     Lower Rate Limit:  70     Upper Rate Limit 110 PAV 280     Sensed AV Delay:  240 Rate Response Parameters:  Rate response 8, medium/low  Tachy Zones VF:  214     VT:  150     VT1:  130     Next Cardiology Appt Due:  09/03/2010 Tech Comments:  6949 lead stable, SIC 0.  Magnet and updated letter given to the patient.  1 ATP episode noted the same day of ER admission, no episodes since.  ROV 3 months with Dr.Cydney Alvarenga in [place]. Altha Harm, LPN  June 13, 2010 12:55 PM

## 2010-06-28 ENCOUNTER — Encounter: Payer: Self-pay | Admitting: Internal Medicine

## 2010-06-28 ENCOUNTER — Encounter (INDEPENDENT_AMBULATORY_CARE_PROVIDER_SITE_OTHER): Payer: Managed Care, Other (non HMO) | Admitting: Internal Medicine

## 2010-06-28 DIAGNOSIS — Z95810 Presence of automatic (implantable) cardiac defibrillator: Secondary | ICD-10-CM

## 2010-06-28 DIAGNOSIS — I2589 Other forms of chronic ischemic heart disease: Secondary | ICD-10-CM

## 2010-06-28 DIAGNOSIS — I472 Ventricular tachycardia: Secondary | ICD-10-CM

## 2010-07-02 NOTE — Progress Notes (Signed)
Summary: Defib shock  Phone Note Call from Patient Call back at Home Phone [phone]   Caller: Wife Call For: Andrew [name] Summary of Call: Pt was lying in bed and defib went off.  Shock caused the pt to jump off of the bed. Initial call taken by: Harlon Flor,  June 20, 2010 4:48 PM  Follow-up for Phone Call        Spoke with patient, he believes he was shocked today.  He does not do [facility].  Since he is not having any other symptoms he will call the [name] office and get an appointment with Dr. Graciela [name] 2/24.  If he has another shock or becomes symptomatic he is to call EMS.  Patient is in agreement. Follow-up by: Altha Harm, LPN,  June 20, 2010 4:57 PM  Additional Follow-up for Phone Call Additional follow up Details #1::        ok Additional Follow-up by: Nathen May, MD, [facility],  June 24, 2010 8:52 PM

## 2010-07-02 NOTE — Assessment & Plan Note (Signed)
Summary: Defib fired on 06/20/10;Add per Paula/AMD   Visit Type:  Follow-up Referring Provider:  gollan Primary Provider:  Dr. Mordecai Maes [facility])  CC:  defib went off. Wednesday is unsure if defib went off again, pt states not sure if it was a dream. Burgess Estelle felt a little chest pain and took isosorbide, and medication gave him a headache again.Marland Kitchen  History of Present Illness:    .Mr. Kau is seen in followup for ventricular tachycardia occurring in the setting of ischemic heart disease prior bypass surgery in 1997 intercurrent PCI in 2006 and again in 2010 with depressed left ventricular function estimated at 30-40%.  He initially presented with an out of hospital arrest and underwent ICD implantation 2006 utilizing a Sprint fidelis lead. He has had no issues with the lead to date .  history on amiodarone for ventricular tachycardia suppression with slightly abnormal thyroid studies.  Cardiac catheter in January 2011 showed a normal left main, 90% LAD stenosis proximally after the takeoff of a large septal perforator, occluded circumflex after the OM 2, second OM with a long 90% proximal lesion and occluded in its midportion, RCA totally occluded proximally, vein graft to the circumflex marginal branch noted to be occluded, occlusion of his vein graft to the RCA, patent LIMA to the LAD with left to right collaterals. Medical management was recommended. Estimated ejection fraction was less than 20% with multiple segmental  wall motion abnormalities as outlined above DONE at cath ovt 2010  He has had recurrent problems with ventricular tachycardia. He was previously intolerant of amiodarone. thyroid and liver abnormalities. He has been treated with mexiletine.           Current Medications (verified): 1)  Carvedilol 12.5 Mg Tabs (Carvedilol) .... Take 1 Tablet By Mouth Two Times A Day 2)  Tamsulosin Hcl 0.4 Mg Caps (Tamsulosin Hcl) .Marland Kitchen.. 1 By Mouth Once Daily 3)  Digoxin 0.125 Mg Tabs  (Digoxin) .Marland Kitchen.. 1  Tablet By Mouth Daily 4)  Effient 10 Mg Tabs (Prasugrel Hcl) .... Take 1 Tablet By Mouth Once A Day 5)  Nitrostat 0.4 Mg Subl (Nitroglycerin) .Marland Kitchen.. 1 Tablet Under Tongue At Onset of Chest Pain; You May Repeat Every 5 Minutes For Up To 3 Doses. 6)  Niacin Cr 500 Mg Cr-Caps (Niacin) .Marland Kitchen.. 1 By Mouth Once Daily 7)  Fish Oil 1000 Mg Caps (Omega-3 Fatty Acids) .Marland Kitchen.. 1 By Mouth Once Daily 8)  Hydrocodone-Acetaminophen 5-500 Mg Tabs (Hydrocodone-Acetaminophen) .... As Directed 9)  Tylenol 325 Mg Tabs (Acetaminophen) .... As Needed 10)  Aspirin 81 Mg Tbec (Aspirin) .... Take One Tablet By Mouth Daily 11)  Mexiletine Hcl 250 Mg Caps (Mexiletine Hcl) .... Take 1 By Mouth Two Times A Day 12)  Klor-Con 10 10 Meq Cr-Tabs (Potassium Chloride) .... Take 2 By Mouth Once Daily 13)  Furosemide 20 Mg Tabs (Furosemide) .... Take One Tablet By Mouth Daily. 14)  Crestor 10 Mg Tabs (Rosuvastatin Calcium) .... Take 1/2  Tablet By Mouth Daily.  Allergies (verified): No Known Drug Allergies  Past History:  Past Surgical History: Last updated: 06/21/2009 CABG  Family History: Last updated: 06/21/2009  Mother had congestive heart failure, died at age 108.   Father died at age 31 of old age.  He has one brother living, he had  a  myocardial infarction at age 48,he had the subsequent coronary  artery  bypass graft surgery.   Social History: Last updated: 06/21/2009 The patient lives in [place] with his wife.  He  retired in 2002.  He was a Paramedic.  He did smoke about one  pack  per day for many years, quit in 2004 at the age of 75.  Does  not partake  of alcoholic beverages to any great extent.   Past Medical History:  1. Near-syncopal spell secondary to slow ventricular tachycardia.   2. Known ischemic cardiomyopathy with an ejection fraction of 25-30%       by echocardiogram this admission.   3. Coronary artery disease, catheterization this admission revealing       occluded  saphenous vein graft to right coronary artery, plan is for       medical therapy.   4. Known coronary artery disease with previous bypass surgery in 1997       with saphenous vein graft to right coronary artery percutaneous       coronary intervention in February 2006, in-stent restenosis treated       with percutaneous coronary intervention in October 2010, now with       occluded saphenous vein graft to right coronary artery.   5. History of a Medtronic implantable cardioverter-defibrillator with       Sprint Fidelis lead in 2006.   6. Slightly abnormal thyroid studies, amiodarone was increased this       admission.   7. Treated dyslipidemia.  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 HEENT Normal.  Neck veins were flat, carotids were brisk.  Lungs were clear.  Heart sounds were regular without murmurs or gallops.  Abdomen was soft with active bowel sounds. There is no clubbing cyanosis or edema. Skin Warm and dry     ICD Specifications Following MD:  Sherryl Manges, MD     ICD Vendor:  Medtronic     ICD Model Number:  [id number]     ICD Serial Number:  [id number] H ICD DOI:  06/17/2004     ICD Implanting MD:  NOT IMPLANTED BY [place]  Lead 1:    Location: RA     DOI: 06/17/2004     Model #: [id number]     Serial #: [id number]     Status: active Lead 2:    Location: RV     DOI: 06/17/2004     Model #: [id number]     Status: active  Indications::  VT   ICD Follow Up ICD Dependent:  No      Episodes Coumadin:  No  Brady Parameters Mode DDDR     Lower Rate Limit:  70     Upper Rate Limit 110 PAV 280     Sensed AV Delay:  240 Rate  Response Parameters:  Rate response 8, medium/low  Tachy Zones VF:  214     VT:  150     VT1:  130     Impression & Recommendations:  Problem # 1:  VENTRICULAR TACHYCARDIA (ICD-427.1) the patient has had recurrent ventricular tachycardia treated with antitachycardia pacing as well as defibrillator discharge. He has had 2 episodes in the  last 2 weeks. We will begin him on quinidine adjunctively to his mexiletine. I will also discuss with Dr. Leonia Reeves and Fawn Kirk as to the possibility of catheter ablation for his VT. Will also consider Tikosyn. Amiodarone is not an option we'll check his metabolic profile today, his magnesium, as well as a CBC with differential as a baseline for his quinidine. His updated medication list for this problem includes:    Carvedilol 12.5 Mg Tabs (Carvedilol) .Marland Kitchen... Take 1 1/2 tablets by mouth two times a day.    Effient 10 Mg Tabs (Prasugrel hcl) .Marland Kitchen... Take 1 tablet by mouth once a day    Nitrostat 0.4 Mg Subl (Nitroglycerin) .Marland Kitchen... 1 tablet under tongue at onset of chest pain; you may repeat every 5 minutes for up to 3 doses.    Aspirin 81 Mg Tbec (Aspirin) .Marland Kitchen... Take one tablet by mouth daily    Mexiletine Hcl 250 Mg Caps (Mexiletine hcl) .Marland Kitchen... Take 1 by mouth two times a day    Quinidine Gluconate Cr 324 Mg Cr-tabs (Quinidine gluconate) .Marland Kitchen... Take one tablet by mouth two times a day.  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 We will plan to increase his carvedilol to 12.5-18.75 twice daily. We'll check his BNP today. I will forward these results to Dr. Clarene Duke His updated medication list for this problem includes:    Carvedilol 12.5 Mg Tabs (Carvedilol) .Marland Kitchen... Take 1 1/2 tablets by mouth two times a day.    Digoxin 0.125 Mg Tabs (Digoxin) .Marland Kitchen... 1  tablet by mouth daily    Effient 10 Mg  Tabs (Prasugrel hcl) .Marland Kitchen... Take 1 tablet by mouth once a day    Nitrostat 0.4 Mg Subl (Nitroglycerin) .Marland Kitchen... 1 tablet under tongue at onset of chest pain; you may repeat every 5 minutes for up to 3 doses.    Aspirin 81 Mg Tbec (Aspirin) .Marland Kitchen... Take one tablet by mouth daily    Mexiletine Hcl 250 Mg Caps (Mexiletine hcl) .Marland Kitchen... Take 1 by mouth two times a day    Furosemide 20 Mg Tabs (Furosemide) .Marland Kitchen... Take one tablet by mouth daily.    Quinidine Gluconate Cr 324 Mg Cr-tabs (Quinidine gluconate) .Marland Kitchen... Take one tablet by mouth two times a day.  Orders: T-BNP  (B Natriuretic Peptide) ([id number])  Problem # 4:  ICD - IN SITU (ICD-V45.02) Device parameters and data were reviewed and no changes were made  Problem # 5:  ATRIAL FIBRILLATION-PAROXYSMAL (ICD-427.31)  no intercurrent atrial fibrillation  His updated medication list for this problem includes:    Carvedilol 12.5 Mg Tabs (Carvedilol) .Marland Kitchen... Take 1 1/2 tablets by mouth two times a day.    Digoxin 0.125 Mg Tabs (Digoxin) .Marland Kitchen... 1  tablet by mouth daily    Effient 10 Mg Tabs (Prasugrel hcl) .Marland Kitchen... Take 1 tablet by mouth once a day    Aspirin 81 Mg Tbec (Aspirin) .Marland Kitchen... Take one tablet by mouth daily    Mexiletine Hcl 250 Mg Caps (Mexiletine hcl) .Marland Kitchen... Take 1 by mouth two times a day    Quinidine Gluconate Cr 324 Mg Cr-tabs (Quinidine gluconate) .Marland Kitchen... Take one tablet by mouth two times a day.  Orders: T-Magnesium [id number])  Other Orders: T-Comprehensive Metabolic Panel [phone]) T-CBC w/Diff [phone])  Patient Instructions: 1)  Your physician recommends that you  schedule a follow-up appointment in: 6-8 wks 2)  Your physician has recommended you make the following change in your medication: START Quinidex 324mg  two times a day. INCREASE Coreg to 1 1/2 tablet by mouth two times a day. Prescriptions: QUINIDINE GLUCONATE CR 324 MG CR-TABS (QUINIDINE GLUCONATE) Take one tablet by mouth two times a day.  #60 x 6   Entered by:    Lanny Hurst RN   Authorized by:   Nathen May, MD, [facility]   Signed by:   Lanny Hurst RN on 06/28/2010   Method used:   Electronically to        [facility] [address] (retail)       [address]       [place], [place]  [address]       Ph: [phone]       Fax: [phone]   RxID:   [id number] CARVEDILOL 12.5 MG TABS (CARVEDILOL) Take 1 1/2 tablets by mouth two times a day.  #90 x 6   Entered by:   Lanny Hurst RN   Authorized by:   Nathen May, MD, [facility]   Signed by:   Lanny Hurst RN on 06/28/2010   Method used:   Electronically to        [facility] [address] (retail)       [address], [address]       [place], [place]  [address]       Ph: [phone]       Fax: [phone]   RxID:   [id number]

## 2010-07-02 NOTE — Letter (Signed)
Summary: Medical Record Release  Medical Record Release   Imported By: Harlon Flor 06/24/2010 13:12:49  _____________________________________________________________________  External Attachment:    Type:   Image     Comment:   External Document

## 2010-07-03 LAB — CONVERTED CEMR LAB
ALT: 19 units/L (ref 0–53)
AST: 23 units/L (ref 0–37)
Albumin: 4.4 g/dL (ref 3.5–5.2)
BUN: 24 mg/dL — ABNORMAL HIGH (ref 6–23)
CO2: 26 meq/L (ref 19–32)
Calcium: 9.8 mg/dL (ref 8.4–10.5)
Chloride: 105 meq/L (ref 96–112)
Creatinine, Ser: 1.09 mg/dL (ref 0.40–1.50)
Hemoglobin: 15.4 g/dL (ref 13.0–17.0)
Lymphocytes Relative: 27 % (ref 12–46)
Magnesium: 1.9 mg/dL (ref 1.5–2.5)
Monocytes Absolute: 0.7 10*3/uL (ref 0.1–1.0)
Monocytes Relative: 11 % (ref 3–12)
Neutro Abs: 3.7 10*3/uL (ref 1.7–7.7)
Neutrophils Relative %: 59 % (ref 43–77)
Potassium: 4.3 meq/L (ref 3.5–5.3)
RBC: 4.84 M/uL (ref 4.22–5.81)
WBC: 6.3 10*3/uL (ref 4.0–10.5)

## 2010-07-21 LAB — URINALYSIS, ROUTINE W REFLEX MICROSCOPIC
Glucose, UA: 100 mg/dL — AB
Ketones, ur: NEGATIVE mg/dL
Protein, ur: 100 mg/dL — AB

## 2010-07-21 LAB — CBC
HCT: 38.9 % — ABNORMAL LOW (ref 39.0–52.0)
HCT: 39.9 % (ref 39.0–52.0)
Hemoglobin: 13.6 g/dL (ref 13.0–17.0)
Hemoglobin: 13.9 g/dL (ref 13.0–17.0)
MCHC: 34.5 g/dL (ref 30.0–36.0)
MCHC: 34.6 g/dL (ref 30.0–36.0)
MCHC: 34.7 g/dL (ref 30.0–36.0)
MCV: 95.5 fL (ref 78.0–100.0)
MCV: 96.1 fL (ref 78.0–100.0)
Platelets: 177 10*3/uL (ref 150–400)
RBC: 4.05 MIL/uL — ABNORMAL LOW (ref 4.22–5.81)
RBC: 4.14 MIL/uL — ABNORMAL LOW (ref 4.22–5.81)
RBC: 4.17 MIL/uL — ABNORMAL LOW (ref 4.22–5.81)
RDW: 14.3 % (ref 11.5–15.5)
WBC: 6.1 10*3/uL (ref 4.0–10.5)
WBC: 6.3 10*3/uL (ref 4.0–10.5)

## 2010-07-21 LAB — URINE MICROSCOPIC-ADD ON

## 2010-07-21 LAB — CK TOTAL AND CKMB (NOT AT ARMC)
CK, MB: 1.6 ng/mL (ref 0.3–4.0)
Total CK: 59 U/L (ref 7–232)

## 2010-07-21 LAB — BASIC METABOLIC PANEL
CO2: 26 mEq/L (ref 19–32)
CO2: 27 mEq/L (ref 19–32)
Calcium: 8.2 mg/dL — ABNORMAL LOW (ref 8.4–10.5)
Calcium: 8.5 mg/dL (ref 8.4–10.5)
Calcium: 8.6 mg/dL (ref 8.4–10.5)
Chloride: 106 mEq/L (ref 96–112)
Creatinine, Ser: 1.1 mg/dL (ref 0.4–1.5)
Creatinine, Ser: 1.12 mg/dL (ref 0.4–1.5)
GFR calc Af Amer: >60 mL/min (ref >60)
GFR calc Af Amer: >60 mL/min (ref >60)
GFR calc Af Amer: >60 mL/min (ref >60)
GFR calc Af Amer: >60 mL/min (ref >60)
GFR calc non Af Amer: 54 mL/min — ABNORMAL LOW (ref >60)
GFR calc non Af Amer: 60 mL/min — ABNORMAL LOW (ref >60)
GFR calc non Af Amer: >60 mL/min (ref >60)
Potassium: 3.7 mEq/L (ref 3.5–5.1)
Potassium: 4.3 mEq/L (ref 3.5–5.1)
Sodium: 138 mEq/L (ref 135–145)
Sodium: 138 mEq/L (ref 135–145)
Sodium: 139 mEq/L (ref 135–145)

## 2010-07-21 LAB — COMPREHENSIVE METABOLIC PANEL
ALT: 89 U/L — ABNORMAL HIGH (ref 0–53)
Calcium: 8.8 mg/dL (ref 8.4–10.5)
GFR calc Af Amer: >60 mL/min (ref >60)
Glucose, Bld: 116 mg/dL — ABNORMAL HIGH (ref 70–99)
Sodium: 143 mEq/L (ref 135–145)
Total Protein: 5.5 g/dL — ABNORMAL LOW (ref 6.0–8.3)

## 2010-07-21 LAB — CARDIAC PANEL(CRET KIN+CKTOT+MB+TROPI)
CK, MB: 1.9 ng/mL (ref 0.3–4.0)
CK, MB: 2.3 ng/mL (ref 0.3–4.0)
Relative Index: INVALID (ref 0.0–2.5)
Total CK: 49 U/L (ref 7–232)
Total CK: 51 U/L (ref 7–232)
Troponin I: 0.03 ng/mL (ref 0.00–0.06)
Troponin I: 0.04 ng/mL (ref 0.00–0.06)

## 2010-07-21 LAB — POCT CARDIAC MARKERS
CKMB, poc: <1 ng/mL — ABNORMAL LOW (ref 1.0–8.0)
Myoglobin, poc: 43 ng/mL (ref 12–200)
Troponin i, poc: <0.05 ng/mL (ref 0.00–0.09)

## 2010-07-21 LAB — PROTIME-INR
INR: 1 (ref 0.00–1.49)
Prothrombin Time: 13.1 seconds (ref 11.6–15.2)

## 2010-07-21 LAB — T4, FREE: Free T4: 1.91 ng/dL — ABNORMAL HIGH (ref 0.80–1.80)

## 2010-07-21 LAB — DIFFERENTIAL
Basophils Absolute: 0 10*3/uL (ref 0.0–0.1)
Lymphocytes Relative: 29 % (ref 12–46)
Neutro Abs: 4.6 10*3/uL (ref 1.7–7.7)

## 2010-07-21 LAB — HEPARIN LEVEL (UNFRACTIONATED)
Heparin Unfractionated: 0.58 IU/mL (ref 0.30–0.70)
Heparin Unfractionated: 0.72 IU/mL — ABNORMAL HIGH (ref 0.30–0.70)

## 2010-07-21 LAB — BRAIN NATRIURETIC PEPTIDE
Pro B Natriuretic peptide (BNP): 102 pg/mL — ABNORMAL HIGH (ref 0.0–100.0)
Pro B Natriuretic peptide (BNP): 187 pg/mL — ABNORMAL HIGH (ref 0.0–100.0)

## 2010-07-21 LAB — LIPID PANEL
Cholesterol: 129 mg/dL (ref 0–200)
LDL Cholesterol: 52 mg/dL (ref 0–99)

## 2010-07-21 LAB — HEMOGLOBIN A1C: Hgb A1c MFr Bld: 6.2 % — ABNORMAL HIGH (ref 4.6–6.1)

## 2010-07-21 LAB — GLUCOSE, CAPILLARY: Glucose-Capillary: 112 mg/dL — ABNORMAL HIGH (ref 70–99)

## 2010-07-21 LAB — MAGNESIUM: Magnesium: 2 mg/dL (ref 1.5–2.5)

## 2010-08-06 NOTE — H&P (Signed)
NAMEKURON, DOCKEN NO.:  [id number]  MEDICAL RECORD NO.:  [id number]          PATIENT TYPE:  INP  LOCATION:  2032                         FACILITY:  [facility]  PHYSICIAN:  Doylene Canning. Ladona Ridgel, MD    DATE OF BIRTH:  [birth year]  DATE OF ADMISSION:  05/29/2010 DATE OF DISCHARGE:                             HISTORY & PHYSICAL   INDICATION FOR ADMISSION:  ICD discharge secondary to ventricular tachycardia.  HISTORY OF PRESENT ILLNESS:  The patient is a very pleasant 65 year old male with a longstanding ischemic cardiomyopathy.  He is status post MI with multiple percutaneous coronary interventions in the past.  He has an ejection fraction of 35%.  He developed VT and had ICD implantation in 2006.  He was initially placed on amiodarone, but was intolerant of this medication and was ultimately stopped.  The patient notes that he had 2 episodes of near-syncope back about 3 weeks ago and was in his usual state of health painting earlier today when he became dizzy and lightheaded as if he would pass out and received an ICD shock. Afterwards he felt better.  He called our office and was told to come to the emergency room and is here for additional evaluation.  His wife who is with him today states that he has had increasing fatigue and weakness over the last several days, but no frank chest pain, no peripheral edema, and no other ICD shocks.  The patient is very anxious and request to not be admitted to the hospital.  Additional past medical history is notable for: 1. Hypertension. 2. Dyslipidemia. 3. COPD.  SOCIAL HISTORY:  The patient has a history of tobacco abuse.  He currently denies smoking for over 6 years.  He denies alcoholic beverages.  He is a retired Paramedic.  FAMILY HISTORY:  Notable for mother dying of CHF in her 28s and father dying of "old age" in his 11s.  He has one living brother with coronary artery disease.  REVIEW OF SYSTEMS:  All systems  were reviewed and negative except as noted in the HPI.  PHYSICAL EXAMINATION:  GENERAL:  He is a pleasant, well-appearing, but somewhat unkempt-appearing 65 year old man in no acute distress.  He has paint stains on his hand and fingers. HEENT:  He is normocephalic and atraumatic.  Pupils equal and round. Oropharynx is moist.  Sclerae were anicteric. NECK:  No jugular venous distention.  There is no thyromegaly.  Trachea was midline.  Carotids were 2+ and symmetric. LUNGS:  Clear bilaterally to auscultation.  No wheezes, rales, or rhonchi are present. CARDIOVASCULAR:  Regular rate and rhythm with normal S1 and S2.  The PMI was enlarged and laterally displaced.  There was a soft systolic murmur at the left lower sternal border. ABDOMEN:  Soft and nontender.  There was no organomegaly. EXTREMITIES:  Demonstrated no cyanosis, clubbing, or edema.  The pulses were 2+ and symmetric. NEUROLOGIC:  Alert and oriented x3 with cranial nerves intact.  Strength is 5/5 and symmetric.  The EKG demonstrates sinus rhythm.  There are no acute EKG changes.  Interrogation of the patient's defibrillator demonstrates two  ICD therapies for VT in early January.  Today, he had VT at 190 beats per minute, two episodes of antitachycardic pacing failed to terminate the VT, but he was successfully defibrillated with 35 joules of biphasic energy.  Other labs are pending.  IMPRESSION: 1. Ischemic cardiomyopathy. 2. Implantable cardioverter-defibrillator shock, which is his reason     for admission. 3. Ventricular tachycardia with the patient previously intolerant of     amiodarone. 4. Class I-II congestive heart failure with ejection fraction of 35%. 5. Paroxysmal atrial fibrillation.  DISCUSSION:  We will plan to admit the patient for observation on telemetry.  We will obtain serial cardiac enzymes and a BNP.  I will continue his home medications.  We anticipate early discharge if he remains stable and  his enzymes do not turn positive.  He will be a consideration for an antiarrhythmic drug like sotalol, but we will hold off from this now and further decision on antiarrhythmic drug therapy to Dr. Graciela Husbands, who is his primary electrophysiologist.     Doylene Canning. Ladona Ridgel, MD     GWT/MEDQ  D:  05/29/2010  T:  05/30/2010  Job:  [id number]  cc:   Duke Salvia, MD, [facility] Antonieta Iba, MD  Electronically Signed by Lewayne Bunting MD on 06/13/2010 05:16:04 PM

## 2010-08-06 NOTE — Discharge Summary (Signed)
[name], [name] NO.:  [id number]  MEDICAL RECORD NO.:  [id number]          PATIENT TYPE:  OBV  LOCATION:  2032                         FACILITY:  [facility]  PHYSICIAN:  Andrew [name]. Andrew Ridgel, MD    DATE OF BIRTH:  [birth year]  DATE OF ADMISSION:  05/29/2010 DATE OF DISCHARGE:  05/30/2010                              DISCHARGE SUMMARY   PRIMARY CARDIOLOGIST:  Andrew Iba, MD  ELECTROPHYSIOLOGIST:  Andrew Salvia, MD, [facility]  PRIMARY DIAGNOSIS:  Ventricular tachycardia.  SECONDARY DIAGNOSES: 1. Ischemic cardiomyopathy status post myocardial infarction with     multiple percutaneous coronary interventions in the past with a     documented ejection fraction of 35%. 2. Ventricular tachycardia status post Medtronic implantable     cardioverter defibrillator implantation in 2006. 3. Hypertension. 4. Dyslipidemia. 5. Chronic obstructive pulmonary disease.  ALLERGIES:  The patient has no known drug allergies.  PROCEDURES THIS ADMISSION:  None.  BRIEF HISTORY OF PRESENT ILLNESS:  Andrew [name] is a 65 year old male with the longstanding nonischemic cardiomyopathy.  He is status post MI with multiple PCIs in the past.  He developed ventricular tachycardia and had defibrillator implantation placed in 2006.  He was initially placed on amiodarone, but was intolerant of the medication.  He had 2 episodes of near-syncope about 3 weeks ago and on the day of admission was painting when he became dizzy and lightheaded and received an ICD shock. Afterwards, he felt better.  He presented to the Emergency department for evaluation.  HOSPITAL COURSE:  The patient was admitted and placed on telemetry.  His device was interrogated and he was found to have an episode of ventricular tachycardia that he was successfully cardioverted out of through his device.  He had no further ventricular tachycardia while in the hospital.  His cardiac enzymes were negative and other laboratory  data were unremarkable.  Dr. Ladona [name] examined the patient on May 30, 2010, and felt him to be stable for discharge.  LABORATORY DATA:  Calcium 3.9, BUN 20, creatinine 1.15, cardiac enzymes negative.  BNP less than 30.  Digoxin level 0.3.  Hemoglobin 15.4, hematocrit 45.6, platelets 229.  FOLLOWUP APPOINTMENTS: 1. Dr. Graciela [name] in February 2012 as scheduled. 2. Dr. Mariah [name] as scheduled.  DISCHARGE INSTRUCTIONS: 1. Increase activity slowly. 2. Call the office if you receive any further ICD therapy.  DISCHARGE MEDICATIONS: 1. Nitroglycerin sublingual as needed for chest pain. 2. Niacin 500 mg daily. 3. Fish Oil 1000 mg daily. 4. Aspirin 81 mg daily. 5. Potassium chloride 10 mEq 2 tablets daily. 6. Mexiletine 250 mg twice daily. 7. Effient 10 mg daily. 8. Digoxin 0.125 mg daily. 9. Carvedilol 12.5 twice daily. 10.Crestor 10 mg 0.5 tablet daily. 11.Furosemide 20 mg daily. 12.Ibuprofen 200 mg 2 tablets every 4 hours as needed. 13.Flomax 0.4 mg daily.  Of note, the patient is not on ACE inhibitor or ARB.  The patient states that these medications have been stopped in the past secondary to intolerance because of headache.  This will be reevaluated at a followup visit with Dr. Mariah [name] and Dr. Graciela [name].  DISPOSITION:  The patient was  seen and examined by Dr. Ladona [name] on May 30, 2010, and considered stable for discharge.  DURATION OF DISCHARGE ENCOUNTER:  35 minutes.     Gypsy Balsam, RN,BSN   ______________________________ Andrew [name]. Andrew Ridgel, MD    AS/MEDQ  D:  05/30/2010  T:  05/30/2010  Job:  [id number]  Electronically Signed by Gypsy Balsam RNBSN on 06/04/2010 09:50:01 AM Electronically Signed by Lewayne Bunting MD on 06/13/2010 05:15:59 PM

## 2010-08-09 LAB — CBC
HCT: 43.6 % (ref 39.0–52.0)
Hemoglobin: 13.8 g/dL (ref 13.0–17.0)
Hemoglobin: 14.9 g/dL (ref 13.0–17.0)
MCHC: 34.1 g/dL (ref 30.0–36.0)
MCHC: 34.1 g/dL (ref 30.0–36.0)
MCHC: 34.5 g/dL (ref 30.0–36.0)
MCHC: 34.9 g/dL (ref 30.0–36.0)
MCV: 94.8 fL (ref 78.0–100.0)
Platelets: 151 10*3/uL (ref 150–400)
RBC: 4.18 MIL/uL — ABNORMAL LOW (ref 4.22–5.81)
RBC: 4.6 MIL/uL (ref 4.22–5.81)
RDW: 13.6 % (ref 11.5–15.5)
RDW: 13.8 % (ref 11.5–15.5)
WBC: 5.2 10*3/uL (ref 4.0–10.5)

## 2010-08-09 LAB — CARDIAC PANEL(CRET KIN+CKTOT+MB+TROPI)
CK, MB: 4.1 ng/mL — ABNORMAL HIGH (ref 0.3–4.0)
CK, MB: 7.1 ng/mL — ABNORMAL HIGH (ref 0.3–4.0)
Relative Index: 6 — ABNORMAL HIGH (ref 0.0–2.5)
Relative Index: INVALID (ref 0.0–2.5)
Relative Index: INVALID (ref 0.0–2.5)
Total CK: 118 U/L (ref 7–232)
Total CK: 99 U/L (ref 7–232)
Troponin I: 0.5 ng/mL (ref 0.00–0.06)
Troponin I: 0.79 ng/mL (ref 0.00–0.06)

## 2010-08-09 LAB — BASIC METABOLIC PANEL
BUN: 13 mg/dL (ref 6–23)
CO2: 23 mEq/L (ref 19–32)
CO2: 25 mEq/L (ref 19–32)
Calcium: 8.5 mg/dL (ref 8.4–10.5)
Calcium: 8.7 mg/dL (ref 8.4–10.5)
Creatinine, Ser: 0.88 mg/dL (ref 0.4–1.5)
Creatinine, Ser: 0.88 mg/dL (ref 0.4–1.5)
GFR calc Af Amer: >60 mL/min (ref >60)
GFR calc Af Amer: >60 mL/min (ref >60)
GFR calc Af Amer: >60 mL/min (ref >60)
GFR calc non Af Amer: >60 mL/min (ref >60)
GFR calc non Af Amer: >60 mL/min (ref >60)
Glucose, Bld: 108 mg/dL — ABNORMAL HIGH (ref 70–99)
Glucose, Bld: 111 mg/dL — ABNORMAL HIGH (ref 70–99)
Potassium: 4 mEq/L (ref 3.5–5.1)
Sodium: 140 mEq/L (ref 135–145)

## 2010-08-09 LAB — DIFFERENTIAL
Basophils Relative: 0 % (ref 0–1)
Eosinophils Absolute: 0.2 10*3/uL (ref 0.0–0.7)
Eosinophils Relative: 3 % (ref 0–5)
Lymphs Abs: 1.7 10*3/uL (ref 0.7–4.0)
Neutrophils Relative %: 54 % (ref 43–77)

## 2010-08-09 LAB — CK TOTAL AND CKMB (NOT AT ARMC)
CK, MB: 1.3 ng/mL (ref 0.3–4.0)
CK, MB: 1.3 ng/mL (ref 0.3–4.0)
Relative Index: INVALID (ref 0.0–2.5)
Relative Index: INVALID (ref 0.0–2.5)
Total CK: 88 U/L (ref 7–232)
Total CK: 98 U/L (ref 7–232)

## 2010-08-09 LAB — COMPREHENSIVE METABOLIC PANEL
ALT: 28 U/L (ref 0–53)
AST: 27 U/L (ref 0–37)
Alkaline Phosphatase: 73 U/L (ref 39–117)
CO2: 28 mEq/L (ref 19–32)
Calcium: 9.4 mg/dL (ref 8.4–10.5)
Chloride: 105 mEq/L (ref 96–112)
GFR calc Af Amer: >60 mL/min (ref >60)
GFR calc non Af Amer: >60 mL/min (ref >60)
Glucose, Bld: 115 mg/dL — ABNORMAL HIGH (ref 70–99)
Potassium: 4 mEq/L (ref 3.5–5.1)
Sodium: 140 mEq/L (ref 135–145)

## 2010-08-09 LAB — MAGNESIUM: Magnesium: 2.3 mg/dL (ref 1.5–2.5)

## 2010-08-09 LAB — PLATELET COUNT: Platelets: 175 10*3/uL (ref 150–400)

## 2010-08-09 LAB — DIGOXIN LEVEL: Digoxin Level: 0.3 ng/mL — ABNORMAL LOW (ref 0.8–2.0)

## 2010-08-09 LAB — APTT: aPTT: 30 seconds (ref 24–37)

## 2010-08-09 LAB — TROPONIN I: Troponin I: 0.01 ng/mL (ref 0.00–0.06)

## 2010-08-30 ENCOUNTER — Ambulatory Visit: Payer: Managed Care, Other (non HMO) | Admitting: Physician Assistant

## 2010-08-30 ENCOUNTER — Telehealth: Payer: Self-pay | Admitting: Internal Medicine

## 2010-08-30 ENCOUNTER — Encounter: Payer: Self-pay | Admitting: Physician Assistant

## 2010-08-30 DIAGNOSIS — I5022 Chronic systolic (congestive) heart failure: Secondary | ICD-10-CM | POA: Insufficient documentation

## 2010-08-30 NOTE — Telephone Encounter (Signed)
FYI

## 2010-08-30 NOTE — Telephone Encounter (Signed)
Pt walked into the office today under the impression that he had an appt here.  He actually had an appt with Tereso Newcomer in [place].  Pt states that he went to the [place] on Wednesday to see Dr. Beverley Fiedler and that his device was interrogated.  Dr. Beverley Fiedler told the pt that his battery was close to running out and that if he had another episode, that he would drain the battery and the device would cut off.  Pt was advised to see the Cardiologist.  Dr. Beverley Fiedler, also, told the pt that he was being given poisonous medication from our office and that the medication was not working.  He stated that the interrogation showed that he had many episodes over the past few weeks that triggered the device.  [facility].  The device started beeping yesterday to indicate that it was time for a change-out. The pt was very nervous and upset and wanted to be soon ASAP.  After I spoke with Altha Harm, I advised the pt that he could either be seen here on 5/1 with Dr. Graciela Husbands or he could see Tereso Newcomer in [place] this am for his scheduled appt.  The pt stated that he cannot wait until 5/1 to see Graciela Husbands and that he does not want to go to [place].  I explained to the pt that Dr. [name] is not in the office and that he really needed to see an EP physician due to the fact that it was device related.  The pt stated that he was going to go to the [place] instead, because something needed to be done now.  I explained to him that if he went to [facility] today and it was determined that something needed to be done now, that our physicians would take care of it.  The pt left and went to the [place].  He asked that I cancel the appt scheduled with Tereso Newcomer today.

## 2010-09-03 ENCOUNTER — Encounter: Payer: Self-pay | Admitting: Internal Medicine

## 2010-09-03 ENCOUNTER — Ambulatory Visit (INDEPENDENT_AMBULATORY_CARE_PROVIDER_SITE_OTHER): Payer: Managed Care, Other (non HMO) | Admitting: Internal Medicine

## 2010-09-03 DIAGNOSIS — I440 Atrioventricular block, first degree: Secondary | ICD-10-CM

## 2010-09-03 DIAGNOSIS — I5022 Chronic systolic (congestive) heart failure: Secondary | ICD-10-CM

## 2010-09-03 DIAGNOSIS — I452 Bifascicular block: Secondary | ICD-10-CM

## 2010-09-03 DIAGNOSIS — Z95810 Presence of automatic (implantable) cardiac defibrillator: Secondary | ICD-10-CM

## 2010-09-03 DIAGNOSIS — I472 Ventricular tachycardia, unspecified: Secondary | ICD-10-CM

## 2010-09-03 DIAGNOSIS — I2589 Other forms of chronic ischemic heart disease: Secondary | ICD-10-CM

## 2010-09-03 NOTE — Assessment & Plan Note (Signed)
I'm not sure about resynchronization in this cohort of patients. Review this again at [facility] His QRS is greater than 150 and this has been a group in which some benefit has been identified for right bundle branch block patient's

## 2010-09-03 NOTE — Assessment & Plan Note (Signed)
We need to review the issues related to ace inhibitors and aldoseterone antagonists

## 2010-09-03 NOTE — Assessment & Plan Note (Signed)
Patient has had intercurrent ventricular tachycardia treated with antitachycardia pacing. This is better than the fact is getting before. He may well be a candidate for catheter ablation. I reviewed this with Dr. Janyth Pupa

## 2010-09-03 NOTE — Assessment & Plan Note (Signed)
Device is approaching ER eye. No alert were identified on interrogation. The SIC counter is normal. Impedances are in range.

## 2010-09-03 NOTE — Progress Notes (Signed)
HPI  Andrew [name] is a 65 y.o. male Seen in followup for ventricular tachycardia in the setting of ischemic heart disease. He is status post bypass surgery 1997 with repeat PCI 2006 and 2010 previously followed by Dr. Cloyde Reams. He has depressed left ventricular function with ejection fraction about 30 or 40%. He has a history of out-of-hospital cardiac arrest and underwent ICD implantation in 2006 with a 6948-lead.  He has had recurrent ventricular tachycardia resulting in shock therapy. He was previously intolerant of amiodarone and was started on mexiletine some point last year or 2.  Most recent catheterization January 2011 demonstrated severe native and graft disease and medical therapy was recommended. There was a patent LIMA to his LAD and occluded vein grafts to the circumflex and RCA.  He was recently seen at the [facility]. There are a  variety of comments relative to that visit which are in the old chart. Suffice it to say that the patient was quite concerned after that visit relative to care and device management.  He has significant fatigue. He has not had them for significant shortness of breath or chest pain  He reportrs that after his last visit at the [place], the next day he returned later started to beat. He thought it was beating every 4 hours.  Past Medical History  Diagnosis Date  . Ischemic cardiomyopathy     a. EF 25-35%;   b. echo 3/11: EF 35%, mild LVH, severe inf/post HK, lat and apical HK, grade 1 diast dysfxn, mild AI, mild to mod MR, PASP 32  . CAD (coronary artery disease)     a. s/p CABG 1997;  b. s/p PCI to [facility] 2006;  c. s/p BMS x 3 to S-RCA 10/10;  d.  Myoview 2010: EF 24%, basal inf, mid inf. apical inf, basal inf-lat and mid IL scar, no isch;  e. cath 1/11: S-RCA occluded, S-OM chronically occluded, L-LAD ok with L-R collaterals (med. Tx)  . Ventricular tachycardia     a. s/p AICD 2006;   b. Amio intolerant due to liver and thyroid issues;   c. 2/12:  quinidine  added to mexilitine  (consider Tikosyn in future??)  . Systolic CHF, chronic   . Atrial fibrillation   . HTN (hypertension)   . HLD (hyperlipidemia)     Past Surgical History  Procedure Date  . Coronary artery bypass graft 1997  . Cardiac catheterization 2006,02/2009  . Medtronic implantable cardioverter-dibrillator  2006    with sprint fidelis lead    Current Outpatient Prescriptions  Medication Sig Dispense Refill  . aspirin 81 MG tablet Take 81 mg by mouth daily.        . carvedilol (COREG) 12.5 MG tablet Take 12.5 mg by mouth 2 (two) times daily with a meal.        . digoxin (LANOXIN) 0.125 MG tablet Take 125 mcg by mouth daily.        . fish oil-omega-3 fatty acids 1000 MG capsule Take 2 g by mouth daily.        . furosemide (LASIX) 20 MG tablet Take 20 mg by mouth daily.        . isosorbide mononitrate (IMDUR) 30 MG CR tablet Take 30 mg by mouth as needed.        . mexiletine (MEXITIL) 250 MG capsule Take 250 mg by mouth 2 (two) times daily.        . niacin 500 MG tablet Take 500 mg by mouth  daily with breakfast.        . nitroGLYCERIN (NITROSTAT) 0.4 MG SL tablet Place 0.4 mg under the tongue every 5 (five) minutes as needed.        . potassium chloride (KLOR-CON) 10 MEQ CR tablet Take 10 mEq by mouth 2 (two) times daily.        . prasugrel (EFFIENT) 10 MG TABS Take 10 mg by mouth daily.        . rosuvastatin (CRESTOR) 10 MG tablet Take 10 mg by mouth daily. 1/2 tablet daily       . Tamsulosin HCl (FLOMAX) 0.4 MG CAPS Take 0.4 mg by mouth daily.        Marland Kitchen acetaminophen (TYLENOL) 325 MG tablet Take 650 mg by mouth every 6 (six) hours as needed.        Marland Kitchen DISCONTD: HYDROcodone-acetaminophen (VICODIN) 5-500 MG per tablet Take 1 tablet by mouth every 6 (six) hours as needed.          No Known Allergies  Review of Systems negative except from HPI and PMH  Physical Exam Well developed and well nourished in no acute distress HENT normal E scleral and icterus clear Neck  Supple JVP 7-8cm; carotids brisk and full Clear to ausculation Regular rate and rhythm, no murmurs gallops or rub Soft with active bowel sounds No clubbing cyanosis and edema Alert and oriented, grossly normal motor and sensory function Skin Warm and Dry  ECG sinus at 73  RBBB LAFB -73 .14/.15/.40  Assessment and  Plan

## 2010-09-03 NOTE — Assessment & Plan Note (Signed)
The patient has severe non-revascularizable coronary artery disease. We'll continue medical therapy.

## 2010-09-20 ENCOUNTER — Encounter: Payer: Managed Care, Other (non HMO) | Admitting: Internal Medicine

## 2010-09-20 NOTE — Cardiovascular Report (Signed)
[place]  Patient:    Andrew [name], Andrew [name] Visit Number: [id number] MRN: [id number]          Service Type: MED Location: [phone] Attending Physician:  Ophelia Shoulder Dictated by:   Madaline Savage, M.D. Proc. Date: 08/09/01 Admit Date:  08/08/2001   CC:         Cardiac Catheterization Lab   Cardiac Catheterization  PROCEDURES PERFORMED: 1. Selective coronary angiography by Judkins technique. 2. Retrograde left heart catheterization. 3. Left ventriculography.  COMPLICATIONS:  None.  ENTRY SITE:  Right femoral artery.  DYE USED:  Omnipaque.  PATIENT PROFILE:  The patient is a 65 year old gentleman who underwent coronary artery bypass graft surgery in 1997, with a vein graft to the first obtuse marginal branch, a vein graft to his distal right coronary artery, and an internal mammary artery graft to the mid LAD.  He had a Cardiolite stress test on May 18, 2001 showing no evidence of reversible ischemia, ejection fraction 30% and anterolateral wall ischemia.  He entered the hospital on August 08, 2001 with an atypical sounding episode of chest pain, which has since resolved.  Todays cardiac catheterization is performed on an inpatient basis electively to redefine coronary anatomy.  HEMODYNAMICS: 1. Left ventricular pressure:  125/17. 2. Central aortic pressure:  125/70 with mean of 90.  No aortic valve    gradient by pullback technique.   ANGIOGRAPHIC RESULTS: 1. LEFT MAIN CORONARY ARTERY:  Normal. 2. LEFT ANTERIOR DESCENDING CORONARY ARTERY:  Contains a 75% stenosis of an    eccentric nature just before the first septal perforator branch. The    distal LAD is fed via collaterals from a patent internal mammary artery    graft, including the first diagonal branch which is distal to the    insertion site of the IMA.  Both the distal LAD and diagonal appear    normal. 3. LEFT CIRCUMFLEX CORONARY ARTERY:  Gives rise to an  atrial circumflex    branch, then courses through the atrioventricular groove and is a small    vessel; nondominant in nature.  The first obtuse marginal branch is    severely diseased proximally and 100% occluded in the mid portion of the    vessel.  No collateral flow is seen to the distal OM, since the saphenous    vein graft to this segment is 100% occluded at the proximal anastomotic    site. 4. RIGHT CORONARY ARTERY:  The right coronary artery is 100% occluded at its    origin.  The saphenous vein graft to the distal right coronary artery is    widely patent throughout its course, and the distal runoff is good.  LEFT VENTRICULOGRAPHY:  Performed in two views.  Shows hypokinetic segments along the inferobasilar, inferior and anterolateral walls distally.  The apex is dyskinetic.  The anterobasilar segment is normal.  There is no mitral regurgitation.  Estimated left ventricular ejection fraction is 35%; calculated ejection fraction is 40%.  FINAL DIAGNOSES: 1. Multivessel native coronary artery disease, as described above. 2. Patent internal mammary graft to the mid LAD. 3. Patent saphenous vein graft to the distal RCA. 4. 100% occlusion of the obtuse marginal branch vein graft at the    proximal anastomotic site.  DISCUSSION:  Based on the patients anatomy, he is a candidate for anginal symptoms related to his occluded OM1, since both the native vessel and the graft anastomosed to that segment are diseased, and in  fact occluded.  It is recommended the patient stop smoking and that we continue medical therapy for treatment of whatever chest pain he has, which has been rare in the past. Dictated by:   Madaline Savage, M.D. Attending Physician:  Ophelia Shoulder DD:  08/09/01 TD:  08/10/01 Job: [id number]

## 2010-09-20 NOTE — H&P (Signed)
[name], [name] NO.:  [id number]   MEDICAL RECORD NO.:  [id number]          PATIENT TYPE:  EMS   LOCATION:  MAJO                         FACILITY:  [facility]   PHYSICIAN:  Abelino Derrick, P.A.   DATE OF BIRTH:  [birth year]   DATE OF ADMISSION:  06/14/2004  DATE OF DISCHARGE:                                HISTORY & PHYSICAL   Audio too short to transcribe (less than 5 seconds)      LKK/MEDQ  D:  06/14/2004  T:  06/14/2004  Job:  [id number]

## 2010-09-20 NOTE — Discharge Summary (Signed)
[place]. [facility]  Patient:    Andrew [name], Andrew [name] Visit Number: [id number] MRN: [id number]          Service Type: MED Location: [phone] Attending Physician:  Ophelia Shoulder Dictated by:   Marya Fossa, P.A. Admit Date:  08/08/2001 Disc. Date: 08/10/01                             Discharge Summary  DATE OF BIRTH:  1946/02/07.  ADMISSION DIAGNOSES: 1. Chest pain, rule out myocardial infarction. 2. Coumadin therapy for cerebrovascular accident. 3. Ongoing tobacco use. 4. Hyperlipidemia. 5. Hypertension.  DISCHARGE DIAGNOSES: 1. Chest pain resolved myocardial infarction ruled out with negative enzymes.    Status post cardiac catheterization revealing stable coronary artery    disease.  Likely gastrointestinal etiology. 2. Coumadin therapy for cerebrovascular accident. 3. Ongoing tobacco use. 4. Hyperlipidemia. 5. Hypertension. 6. Tobacco use - counseled on cessation.  HISTORY OF PRESENT ILLNESS:  Mr. Tillie Rung is a 65 year old  Caucasian male patient of Madaline Savage, M.D., with known coronary disease, status post CABG in 1997.  His last Cardiolite in 2002 was negative.  He is admitted to [facility] this morning with complaints of substernal chest pressure with a squeezing and choking sensation in the throat.  This woke him up around two in the morning with no shortness of breath or diaphoresis except for mild nausea.  He did not have any nitroglycerin to take.  The chest pain seems to occur with exertion at other times.  In the emergency room, EKG shows normal sinus rhythm with no acute abnormality.  Initial cardiac enzymes were negative.  The patient will be admitted for chest pain, rule out MI.  Restart heparin when INR is below 2.0 and plan for cardiac catheterization.  Will start proton pump inhibitor for possible GI etiology of chest discomfort.  PROCEDURES:  Cardiac catheterization August 09, 2001, by Dr. Chanda Busing.  COMPLICATIONS:  None.  CONSULTATIONS:  None.  HOSPITAL COURSE:  Mr. Tillie Rung was admitted to [facility] on August 08, 2001, with chest pain.  His INR was subtherapeutic on admission and heparin was started IV.  He was ordered nitroglycerin as needed for chest pain as he was pain free.  Admission laboratory studies were within normal limits with a hemoglobin of 14.6, potassium 3.9, BUN 16, creatinine 0.7.  INR subtherapeutic at 1.5.  The patient states that he had been off Coumadin for five days for epidural injections and had just restarted it when he presented to the hospital for chest pain and therefore did not have time for the INR to become therapeutic again.  The patient was seen by the smoking cessation consult on August 09, 2001.  On August 09, 2001, he was also taken to the cardiac catheterization lab by Dr. Chanda Busing.  His revealed native coronary artery disease.  The LIMA to the LAD was widely patent.  The distal LAD beyond the anastomosis was widely patent. The native circumflex was patent with an occlusion of an OM branch which is chronic.  The SVG to the RCA was patent with the distal RCA being widely patent.  EF 35%.  Dr. Elsie Lincoln recommended medical therapy and smoking cessation.  Will continue proton pump inhibitor therapy.  If the chest pain continues, he may need a GI consultation.  On August 10, 2001, the patient continued to be stable.  Potassium was slightly elevated at 5.1 but the patient has not received any potassium supplements. We will keep a close eye on this.  He is otherwise stable for discharge.  Groin is stable.  DISCHARGE MEDICATIONS: 1. Coumadin 2.5 mg a day except 5 mg on Mondays as at home - however,    to take 5 mg q.d. today and tomorrow then resume the usual dose. 2. Digoxin 0.25 mg a day. 3. Zocor 40 mg. 4. Protonix 40 mg a day (new). 5. Toprol XL 25 mg a day (new). 6. Terazosin 2 mg a day. 7.  Lisinopril 20 mg a day. 8. Pain medications as needed and as directed as before. 9. Nitroglycerin as needed for chest pain.  ACTIVITY:  No lifting more than five pounds or driving for two days.  DIET:  Low fat, low cholesterol, low salt diet.  WOUND CARE:  May shower.  FOLLOW-UP:  He should get a BMP next Monday to check his potassium.  He will follow up on August 26, 2001, at 3:30.  He will see Loma Newton on Monday, August 16, 2001, at 9:45 for an INR check.  If he has any problems or questions in the interim, he should call.  DISCHARGE INSTRUCTIONS:  He should call the office if any problems or questions.  We would like him to stop smoking. Dictated by:   Marya Fossa, P.A. Attending Physician:  Ophelia Shoulder DD:  08/10/01 TD:  08/10/01 Job: [id number]

## 2010-09-20 NOTE — Op Note (Signed)
NAMELUCHIANO, VISCOMI NO.:  [id number]   MEDICAL RECORD NO.:  [id number]          PATIENT TYPE:  INP   LOCATION:  2928                         FACILITY:  [facility]   PHYSICIAN:  Janeece Riggers. Severiano Gilbert, M.D.    DATE OF BIRTH:  November 30, 1945   DATE OF PROCEDURE:  06/17/2004  DATE OF DISCHARGE:                                 OPERATIVE REPORT   PROCEDURES PERFORMED:  1.  Implantation -- implantable cardioverter-defibrillator.  2.  Fluoroscopy for implantable cardioverter-defibrillator implantation.  3.  Left axillary venography.  4.  Device testing to include defibrillation testing of lead and device.   PRE-PROCEDURE DIAGNOSIS:  Sustained monomorphic ventricular tachycardia.   POSTPROCEDURE DIAGNOSIS:  Sustained monomorphic ventricular tachycardia.   OPERATOR:  Mark E. Severiano Gilbert, M.D.   COMPLICATIONS:  None.   ESTIMATED BLOOD LOSS:  Less than 30 mL.   PROCEDURE DETAIL:  The patient was brought to the EP lab in a fasting state.  The patient's left pectoral area was prepped and draped in the usual manner.  Ancef 1 g IV was infused immediately prior to skin incision for  antimicrobial prophylaxis.  Conscious sedation was obtained with  intermittent injections of midazolam and fentanyl.  Local anesthesia was  obtained with subcutaneous injection with 1% lidocaine.  A 7-cm incision 2  cm inferior to the left clavicle and reaching the left deltopectoral groove  was made to the level of the prepectoral fascia using a combination of sharp  and blunt dissection.  Inferior to the incision line, an appropriately sized  ICD pocket was fashioned using blunt dissection, hemostasis was assured with  electrocautery, and the pocket was copiously irrigated with antibiotic-  containing solution.  After performance of a left axillary venogram, a thin-  walled needle technique was used along the first rib in extrathoracic  fashion to introduce two 7-French sheaths.  A Medtronic model [id number] active-  fixation atrial packing lead was advanced fluoroscopically into the right  atrial appendage.  After determining adequate electronic characteristics,  the sheath was removed, and the lead was sutured to the prepectoral fascia  using 0 silk ligatures.  Through the second venous access, a Medtronic model  [id number] passive-fixation defibrillation lead was advanced fluoroscopically  first into the IVC, and then over a curved stylette into the pulmonary  artery, and finally over a straight stylette into the right ventricular  apex.  After determining adequate electronic characteristics, the sheath was  removed, the lead sutured to the prepectoral fascia using 0 silk ligatures.  The atrial and ventricular leads were then connected to a Medtronic model  #[id number] dual-chamber ICD, serial #[id number], which was then placed within  the pre-formed ICD pocket.  The ICD pocket was closed in layers of 2-0 and 4-  0 Vicryl.  The skin incision was reinforced with Steri-Strips and a bulky  pressure dressing was applied.  After safety margin testing, the patient was  returned to the holding area in stable hemodynamic condition.   The electronic characteristics of the atrial lead demonstrated through the  stimulator, a capture threshold of 0.8 volts at 0.5 msec, impedance  of 796  ohms, and a measured P wave of 2.1 mV.  The ventricular lead demonstrated a  capture threshold of 0.3 volts at 0.5 msec, impedance of 770 ohms, and a  measured R wave of approximately 7-8 mV.  There was absence of diaphragmatic  pacing at maximum output on both the A and V leads.  An episode of  ventricular fibrillation was initiated using a T wave shock protocol, the  device appropriately recognized the rhythm at a relatively insensitive  setting, the high energy shock in venous was approximately 40 ohms, and a  single 20-joule shock allowed there to be resumption of a perfusing rhythm.  The device was programmed as a dual-chamber  pacemaker, low rate 55 beats per  minute, upper rate 110 beats per minute.  Nominal AV delays were programmed  on outputs in both the A and V were 5 volts at 0.5 msec.  Two zones of tachy-  therapy were programmed on ventricular fibrillation for heart rates greater  than 200 beats per minute; the patient will receive ATP while charging, but  first shock therapy will be 30 joules followed by max output of the device.  The VT setting was programmed on from 165-200 beats a minute, which the  patient will receive a flurry of ATP followed by a very low-energy 1-joule  cardioversion shock, followed by 25- and then 35-joule shocks.      MEP/MEDQ  D:  06/17/2004  T:  06/18/2004  Job:  [id number]

## 2010-09-20 NOTE — Discharge Summary (Signed)
[place]  Patient:    Andrew [name], Andrew [name]                        MRN: [id number] Adm. Date:  11/25/00 Disc. Date: 11/26/00 Attending:  Madaline Savage, M.D. Dictator:   Raymon Mutton, P.A.                           Discharge Summary  DATE OF BIRTH:  01-Aug-2045  DISCHARGE DIAGNOSES: 1. Unstable angina.  Cardiac enzymes negative x 3. 2. Coronary artery disease, status post coronary artery bypass grafting in    1997. 3. Hypertension, controlled. 4. Hyperlipidemia. 5. Status post cerebrovascular accident following coronary artery bypass    grafting surgery. 6. Family history of coronary artery disease. 7. Ongoing tobacco abuse.  HISTORY OF PRESENT ILLNESS:  A 65 year old white male with a history of coronary artery disease and status post CABG in 1997.  Negative Cardiolite in 2000.  He reported to the emergency room with chest pain of two or three days duration.  He did not complain of diaphoresis, but experienced shortness of breath with the pain and some nausea, but no vomiting.  The pain stayed in the middle of the retrosternally and did not radiate to the back or to the arms. On arrival to the emergency room, the patient was given a GI cocktail.  He left the emergency room AMA.  The next morning, he presented to the emergency room with recurrent symptoms of shortness of breath, but no nausea this time and also no diaphoresis, but the same type of substernal chest pain.  At this time, the patient was started on IV heparin per pharmacy and IV nitroglycerin.  HOSPITAL COURSE:  He was transferred to the unit for observation.  His EKG showed normal sinus rhythm with intraventricular conduction delay and nonspecific ST-T wave changes.  While on the floor in the telemetry unit, the patient remained stable.  His chest pain was relieved by injection of nitroglycerin and he remained in sinus rhythm.  Cardiac enzymes x 3 were negative.  His Coumadin  was put on hold and the patient was started on heparin IV per pharmacy protocol, but his INR remained increased.  At admission, it was 8.5.  On the day of discharge, the INR dropped to 6.2 and still was supratherapeutic, but still remained above therapeutic levels.  BMP was within normal limits, as well as CBC.  The morning of his discharge, the patient was examined by Richard A. Alanda Amass, M.D., who felt that it would be more wise to proceed with cardiac catheterization versus Persantine Cardiolite stress test, but the INR remained too high to go ahead and schedule the patient for catheterization and plus his chest pain subsided and there was no need for emergency catheterization that day.  Also, the patient preferred to follow up with a Cardiolite stress test versus catheterization.  The patient was discharged home with instructions to hold Coumadin.  An appointment was scheduled in our office with Loma Newton to follow up on the dose of Coumadin and increased INR.  This appointment is on Monday, November 30, 2000, at 1:15 p.m. in our office.  Other instructions were to keep a low-fat, low-salt diet.  A follow-up stress test was scheduled in our office on December 03, 2000, at 9:15 a.m.  After this, the patient will be evaluated by Madaline Savage, M.D., in the office.  An  appointment was scheduled for December 08, 2000, at 9:15 a.m.  DISCHARGE MEDICATIONS: 1. Aspirin 325 mg daily. 2. Coreg 3.125 mg daily. 3. Lanoxin 0.25 mg daily. 4. Lisinopril 20 mg daily. 5. Zocor 40 mg daily. 6. Terazosin 10 mg daily. 7. Imdur 15 mg daily. 8. Protonix 40 mg daily.  DISPOSITION:  The patient was discharged home in stable condition.  His chest pain was resolved.  We will see him as an outpatient as mentioned above after his outpatient Cardiolite Persantine stress test.DD:  11/26/00 TD:  11/26/00 Job: [id number]

## 2010-09-20 NOTE — Cardiovascular Report (Signed)
Andrew [name], Andrew [name]               ACCOUNT NO.:  [id number]   MEDICAL RECORD NO.:  [id number]          PATIENT TYPE:  INP   LOCATION:  1826                         FACILITY:  [facility]   PHYSICIAN:  Richard A. Alanda Amass, M.D.DATE OF BIRTH:  01/22/1946   DATE OF PROCEDURE:  DATE OF DISCHARGE:                              CARDIAC CATHETERIZATION   PROCEDURES:  Retrograde central aortic catheterization, selective coronary  angiography by Judkins' technique, saphenous vein graft angiography,  selective LIMA, subselective RMA, aortic arch angiogram, LAO projection, LV  angiogram, RAL, LAO projection, abdominal aortic angiogram midstream PA  projection, weight adjusted heparin, Aggrastat double bolus plus infusion,  aspirin and 600 mg of Plavix p.o., right heart catheterization, thermal  dilution cardiac output, temporary transvenous pacemaker, intragraft ICGTNG  administration, recanalization totally occluded SVG-RV-PDA, PLA sequential  graft, aspiration thrombectomy with diver CE (EB3) aspiration catheter,  aspiration thrombectomy with exsizer catheter, tandem DES overlapping long  stents 3.0/33 + 3.0/33 CYPHER, post dilatation, high pressure ostial 3.25 mm  balloon.   BRIEF HISTORY:  Please see H&P for full details.  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  He has three children and three grandchildren from his first marriage.  He  has a long history of coronary disease dating back to age 73 when he  suffered an acute MI (presumably DMI) hospitalization in [place].  Was  treated with thrombolytic therapy and then transferred to [facility] where the  next day he had a angioplasty.  I presume this was of the RCA, but we do  not have this data.   He was treated medically and then had a second MI somewhere between 25 and  1990.  Was under the care of Dr. Peter Swaziland at [facility] and was  treated medically.  He subsequently had  progression of disease with  catheterization in 1997 by Dr. Elsie Lincoln and subsequent CABG x5 by Dr. Andrey Campanile  in 1997.  Unfortunately he has continued to smoke.  He has gotten along well  otherwise with no significant angina and negative Cardiolite for new  ischemia, but did have recatheterization for chest pain in 2003.  This  demonstrated occluded SVG to the OM, patent LIMA to the LAD and patent SVG  to the RCA.  EF was between 35-40% and he was continued on medical therapy.  He continued to smoke.   After his bypass surgery, he had some type of CNS event that is not clear  now that predominantly affected his vision and was transient.  Since that  time, in approximately 1998, he has been on long-term Coumadin therapy.  He  has not been on aspirin therapy.  He has been on ACE inhibitor, Statin, beta  blockers and has been encouraged to discontinue smoking.   On the day of admission, he was out shopping with his wife and he developed  lightheadedness, dizziness and presyncope.  This was then followed by severe  substernal chest pain that lasted 15 to 20 minutes.  He tried to drive  himself to the emergency room, but had  to stop in a church parking lot and  called 911.  When the EMS arrived, he was awake but in monomorphic  ventricular tachycardia at approximately 185 per minute and he received 100  joule external biphasic shock back to sinus rhythm.  He had presyncope with  his ongoing ventricular tachycardia but no loss of consciousness.  He was  brought to [facility] Emergency Room.  Initial EKG showed inferior ST elevation,  anterior ST depression with chronic right bundle branch block and left axis  deviation.  He was pain-free at that time and subsequent EKGs did not show  any significant inferior ST elevation or anterior ST depression.  He had  been on Coumadin, but his INR was 1.6 so we elected to proceed with urgent  catheterization.   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  Initial CPK was negative, but this  was early.   He was brought to the catheterization laboratory to clarify the situation  and rule out acute ischemia and define his anatomy.  Informed consent was  obtained from the patient and his wife to proceed.  His creatinine was  normal preoperatively.   The patient was brought to second floor CP laboratory, the right groin was  prepped, draped in the usual manner, 1% Xylocaine was used for local  anesthesia.  Because of his bifascicular block, he was given 150 mg of  amiodarone IV in the emergency room and started on a drip.  The CFRA and  CFRV were entered with single anterior punctures and 6-French side arm  sheaths were inserted without difficulty.  The patient was then given way to  adjusted heparin.  Diagnostic coronary angiography was done with 6-French 4  cm taper perform Cordis coronary and catheters.  The right coronary catheter  we used for subselective RAMA, a 6-French LIMA catheter was used for  selective LIMA.  A multipurpose A1 catheter was used for selective SVG to  RCA.  A pigtail catheter was used for LV angiogram in the RAO and LAO  projection.  A 25 mL 14 mL/second, 20 mL, 12 mL/per second respectively.  Pull back pressure CA showed no gradient.  Aortic root injection was done at  25 mL, 18 mL/second in the LAO projection.  Abdominal aortic angiogram was  done in the midstream PA projection at 25 mL, 20 cc/second to assess his  abdominal aorta for possible IABP placement if necessary.  Catheters  removed.  The patient tolerated the diagnostic procedure well.  External  patches were in place and a temporary pacemaker was ready in case he  required this because of his bifascicular block, but this was not necessary.  PRESSURES:  LV:  Postreperfusion with 35/19; LVEDP 24 mmHg.  CA:  Prereperfusion  95/105 systolic.  Postreperfusion 137/82 mmHg.   Postreperfusion:  PA: 50/22; mean 33 mmHg.  PCW:  V = 28; A = 26; mean 22 mmHg.  CO/CI = 3.6/1.9, L/MIN/sq m.   LV angiogram in the RAO projection showed severe hypo-akinesis and  predominant akinesis of the entire inferior wall which may be some residual  motion left.  The apex was hypo-akinetic up to the distal third of the  anterolateral wall.  Estimated EF in the RAO projection was approximately 15-  20%.   In the LAO projection there was hypo-akinesis of the distal inferior septal  and apical  segment and estimated EF 20-25%.  No significant mitral  regurgitation.   The brachiocephalic was widely patent.  A nongrafted RIMA was patent and the  right vertebral was antegrade.   The left subclavian had 30% narrowing with good flow.  The left vertebral  however, had 90% concentric stenosis proximally with antegrade flow.   Abdominal aortic angiogram revealed a single normal left renal artery, the  right renal artery had 20-30% narrowing with calcification, but good flow.  The infrarenal abdominal aorta had only mild atherosclerotic disease up to a  distal third where it had moderate atherosclerotic disease and about 30%  narrowing.  There was calcific iliacs bilaterally with no significant  stenosis.  The hypogastrics were intact.  The external iliacs were intact  and SFA profunda junctions were intact bilaterally.  The SMA and celiac were  patent and the IMA was intact.   Aortic root injection did not show any patent grafts.  There was no  dissection and no significant aortic insufficiency.  There was normal origin  of the great vessels.  The aortic valve was trileaflet.   Fluoroscopy showed 3 to 4+ coronary calcification bilaterally.  No  significant intracardiac or valvular calcification.   The main left coronary was normal.   The LAD had antegrade flow on hand ejection down to the apex with an  eccentric 90% lesion between  DX1 and DX2 and between two very large septal  perforator branches.  This was calcific eccentric approximately 90%.  There  was flow past the LIMA insertion and some retrograde flow into the LIMA.   On LIMA injection, the LIMA was widely patent, but there was a very large  thoracic branch from the proximal third.  The LIMA had no significant  stenosis with an excellent anastomosis into the junction of the proximal mid-  third of the LAD.  There was excellent filling of the LAD down to the apex  where it bifurcated.  DX3 and DX4  and DX5 were all of moderate size and  filled well.  There was retrograde filling of DX1 that was not jeopardized  and this was a moderately large bifurcating diagonal filled through the  LIMA.  It was jeopardized from the native LAD lesion since it arose just  distal to it.   The circumflex artery showed an occluded second marginal branch.  The first marginal was thin and long but patent.  There was a small atrial branch  present.  No significant antegrade circumflex flow and some  collateralization was seen late to a distal marginal.   The right coronary artery was totally occluded in its proximal third after a  conus branch with no antegrade filling.   The saphenous vein graft to the marginal was totally occluded.   The saphenous vein grafts to RCA was totally occluded with no antegrade flow  and there was a small nipple in this area.   At this juncture, the patient fortunately remained stable.  We felt best to  try and probe the right coronary graft to see if would could reperfuse.  He  was pain-free and angiographically the graft appeared to be probably  chronically occluded.  It did not have the angiographic appearance of acute  thrombotic occlusion.   The patient was given 4000 units of heparin.  A 6-French multipurpose Cordis  guide was used to intubate the graft proximally, but there was no antegrade  filling.  Several guide wires were tried  including a 0.014 inch, [employer]  soft  light, Guidant 200 and 300 0.010 inch cross set wires which were all  unsuccessful in penetrating the total occlusion.  A choice PT 2 Cymed LF  wire was then utilized.  I was able to slowly progress this and maintain the  wire intraluminally and then it popped across the proximal third of the  graft and was free in the distal graft suggesting that the most prominent  stenosis was ostial and/or proximal.  The wire passed freely into the distal  PLA and it was clear that this was a large distal vessel.  There was  essentially no flow however with just the guide wire placed.   At this point, the patient was started on double bolus Aggrastat plus  infusion.  He was given 600 mg of Plavix and he had previously been given 10  grains of aspirin in the emergency room.  ACTs were monitored.   A 2.0 balloon was then used to dilate the proximal and mid-graft in a graded  fashion.  A Voyager 2.0/15 10-40, 5-19, 10-40 and 10-39.   This restored minimal flow to the graft and it was clear that there was  extensive thrombotic occlusion and probably chronic fibrotic changes within  the graft.  How much was new or old was not clear angiographically, but we  elected to proceed.   We then used over to 300 cm HGF wire.  Did aspiration thrombectomy with an  EV3 diver CE catheter.  Several passes were obtained with aspiration.  This  had limited benefit.  This was then changed to an exsizer Cymed thrombectomy  catheter and approximately 7 to 8 runs were performed with vacuum suction.  There was significant improvement in the thrombotic material and TIMI 3 flow  was restored to the distal vessel with no evidence of no reflow.  We felt  we had reached maximum benefit and the exsizer catheter was used with a  temporary transvenous pacemaker in place in the RV because of the patient's  bifascicular block and aspiration thrombectomy.  After being able to visualize well, we  then DES stented the graft from the  mid-portion back to the ostia with two overlapping 3.0/33 CYPHER DES stents.  The first one was deployed in the mid-portion of the graft a 10-29  postdilated 10-28.  The second one was aligned to cover the ostia and  overlap the first stent and deploy a 12-50 postdilated over in the overlap  14-32 and at the ostia 14-37.  The balloon was then exchanged for an  upgraded 3.2 5/15 Quantum Maverick and the ostia was redilated within the  stent at 15-38.  The balloon was pulled back and final injections  demonstrated excellent angiographic result with widely patent graft, stents  extending from the ostia up just before the insertion of the large  bifurcating RFV branch.  There was no residual thrombus seen, no dissection  and there was excellent TIMI 3 flow to the large bifurcating RV branch from  the mid-portion of the graft and to the trifurcating PLA and long PDA  vessel.  The patient tolerated this well.  He did have discomfort with  inflations relieved with deflations.  There was no significant arrhythmia.  Final right heart pressures were then obtained as outlined above.  The  pacemaker was removed.  The Swan-Ganz was removed.  The side arm sheaths  were secured and flushed.  The patient was brought to the holding area for  postoperative care in stable condition.  Final ACT was 277 seconds.   We plan to continued 2B3A inhibitor for 48 hours.  Continue medical therapy  of his LV dysfunction.   Depending upon his enzyme elevation, we might be able to tell whether this  was a chronic total occlusion of the SVG to the right or whether this was an  acute ischemic event.  At present, I think this was a chronic total  occlusion because of the angiographic appearance.  In any event, I believe  he will require maximum medical therapy and evaluation for ICD therapy for  his monomorphic sustained VT.  He may need to be considered for  biventricular pacing  depending upon its EF clinical outcome and echo values.   CATHETERIZATION DIAGNOSES:  1.  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  Probable DMI treated with      thrombolytic therapy and subsequent PTCA at [facility] and [place].      Probably subsequent total occlusion.  2.  Recurrent MI approximately 1988 to 1990, medical therapy.  3.  Correction of coronary disease and CABG x5, Dr Andrey Campanile, 1997.  4.  Patent LIMA to LAD and patent SVG to RCA, RAM and distal vessel 2003      with occluded SVG to OM and EF 35-40%.  5.  Dizziness, presyncope, monomorphic sustained ventricular tachycardia and      subsequent chest pain prompting external cardioversion by EMS June 14, 2004.  6.  Severe LV dysfunction.  7.  Occluded SVG to RCA, acute versus chronic.  8.  COPD continued cigarette abuse.  9.  Hypertension on medical therapy.  10. Hyperlipidemia.  11. Chronic bifascicular block, RBBB with left axis deviation. 12. Chronic back pain status post epidural injections.  13. Left rotator cuff surgery, Dr. Eulah Pont, August 2002.  14. BPH on Proscar.  15. Past CVA with visual disturbances approximately several months post CABG      on chronic Coumadin therapy up till today.      RAW/MEDQ  D:  06/15/2004  T:  06/16/2004  Job:  [id number]   cc:   Madaline Savage, M.D.  [address], [address]  [place]  [place] [address]  Fax: [phone]   CP Laboratory   Michel Harrow, M.D.  [facility]   Kennon Portela, M.D.

## 2010-09-20 NOTE — H&P (Signed)
NAMEPOLK, MINOR               ACCOUNT NO.:  [id number]   MEDICAL RECORD NO.:  [id number]          PATIENT TYPE:  INP   LOCATION:  1826                         FACILITY:  [facility]   PHYSICIAN:  Richard A. Alanda Amass, M.D.DATE OF BIRTH:  [birth year]   DATE OF ADMISSION:  06/14/2004  DATE OF DISCHARGE:                                HISTORY & PHYSICAL   CHIEF COMPLAINT:  Chest pain.   HISTORY OF PRESENT ILLNESS:  The patient is a 65 year old male with a  history of coronary disease. He had a MI at 65 years old treated with  anxiolytics and transferred to [facility] for an angioplasty. He ended up having  bypass surgery x 3 in 1997 by Dr. Delsa Grana. Wilson. He has been followed by  Dr. Madaline Savage. He had a catheterization after an abnormal Cardiolite  study in April of 2003. This revealed an occluded SVG to his OM, patent LIMA  to the LAD, and a patent SVG to the RCA. His EF then was 30-35%. He did see  Dr. Madaline Savage about a year ago. He is followed usually at the [facility].  He is on Coumadin for a postbypass stroke with some transient visual loss.   Today, he developed substernal chest pain about 6 o'clock. He described this  as a concrete block on his chest. He tried to drive to the hospital but  became too ill and pulled over. When EMS found him, he was diaphoretic. He  was in ventricular tachycardia and was shocked to sinus rhythm. He is now in  the emergency room and his pain has resolved. Denies any recent exertional  angina symptoms and says overall he has been doing well.   PAST MEDICAL HISTORY:  1.  Remarkable for chronic back pain. He is followed at the [facility] in      [place]. He had an epidural about six weeks.  2.  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  3.  Hyperlipidemia.  4.  Hypertension.   CURRENT MEDICATIONS:  1.  Niacin 250 mg h.s.  2.  Isosorbide 10 mg b.i.d.  3.  Lanoxin 0.125 mg q.d.  4.  Lopressor 25  mg b.i.d.  5.  Zocor 40 mg q.d.  6.  Terazosin 5 mg h.s.  7.  Proscar 5 mg q.d.  8.  Coumadin 1.25 mg on Monday, Wednesday, and Friday and 2.5 mg on the      other days.  9.  Hydrocodone 5/500 mg t.i.d. p.r.n.   ALLERGIES:  He has no known drug allergies.   SOCIAL HISTORY:  He is remarried. He has three children and three  grandchildren. He is back to smoking about a pack a day. He is not currently  working. Denies alcohol.   FAMILY HISTORY:  Remarkable for coronary disease. His mother died at 95 of a  MI. He has one brother without coronary disease. His father died at 1.   REVIEW OF SYMPTOMS:  Essentially unremarkable except as noted above. He has  had  remote peptic ulcer disease but no recent GI bleeding or melena. He has  had a past history of nephrolithiasis. He has a history of BPH. He has not  had syncope or tachycardia.   PHYSICAL EXAMINATION:  VITAL SIGNS:  Blood pressure 121/71, pulse 78,  respirations 12.  GENERAL:  He is a well-developed, well-nourished male in no acute distress.  HEENT:  Normocephalic. Extraocular movements intact. Sclerae are not  icteric.  NECK:  Without JVD and without bruit.  CHEST:  Clear to auscultation and percussion.  CARDIOVASCULAR:  Regular rate and rhythm without obvious murmur, rub, or  gallop. Normal S1 and S2.  ABDOMEN:  Nontender. No hepatosplenomegaly.  EXTREMITIES:  Without edema. There are no femoral bruits noted.  PULSES:  Pulses are 3+/4.  NEUROLOGICAL:  Grossly intact. He is awake, alert, oriented, and  cooperative.   LABORATORY DATA:  His EKG shows sinus rhythm with a right bundle branch  block. His first EKG shows some inferior ST elevation. Chemistry show a  sodium of 137, potassium 3.9, BUN 20, creatinine 1.2. Hemoglobin 13.6,  hematocrit 40, INR is 1.6, troponin is negative x 1.   IMPRESSION:  1.  Distal myocardial infarction, questionable aborted.  2.  Ventricular tachycardia, shock x 1 to sinus rhythm.  3.   Coronary disease with a myocardial infarction and angioplasty at 65      years old. Bypass surgery x 3 in 1997 with subsequent occlusion of his      saphenous vein graft to obtuse marginal in April 2003 treated medically.  4.  Left ventricular dysfunction with an ejection fraction of 30-35% in      2003.  5.  Smoking.  6.  Hyperlipidemia.  7.  Chronic back pain.  8.  Cerebrovascular accident after his bypass in 1997. He is on chronic      Coumadin.  9.  Benign prostatic hypertrophy.   PLAN:  The patient was seen by Dr. Pearletha Furl. Alanda Amass and myself today in  the ER. Dr. Pearletha Furl. Alanda Amass feels it is best to proceed with diagnostic  catheterization since his INR is 1.6.      LKK/MEDQ  D:  06/14/2004  T:  06/14/2004  Job:  [id number]

## 2010-09-20 NOTE — Consult Note (Signed)
NAMEDIOGO, ANNE NO.:  [id number]   MEDICAL RECORD NO.:  [id number]          PATIENT TYPE:  INP   LOCATION:  2928                         FACILITY:  [facility]   PHYSICIAN:  Janeece Riggers. Severiano Gilbert, M.D.    DATE OF BIRTH:  1946-04-12   DATE OF CONSULTATION:  06/17/2004  DATE OF DISCHARGE:                                   CONSULTATION   SOURCE OF CONSULTATION:  [facility]ta   REASON FOR CONSULTATION:  Sustained monomorphic ventricular tachycardia.   HISTORY OF PRESENT ILLNESS:  A 65 year old white male with known coronary  artery disease status post remote acute myocardial infarction and bypass  operation in 1997.  He presented to [facility] after having an aborted  sudden cardiac death episode.  The patient was in his usual state of health  without decompensated congestive heart failure or acute chest pain when he  had the onset of lightheadedness and dizziness.  After this persisted for a  few moments the patient began to have the development of substernal chest  pain.  He attempted to drive himself to the hospital but had to pull over  because of declining mental status.  EMS was called and presented within  five to 10 minutes and found the patient to be in sustained monomorphic  ventricular tachycardia with hypotension.  He was urgently cardioverted in  the field to a perfusing rhythm.  He was brought to the hospital and because  of transient EKG changes and concerns for acute myocardial infarction he was  taken to the cardiac catheterization laboratory.  At that time he was found  to have one of three vein grafts patent.  There did not appear to be a  culprit lesion.  There were no EKG abnormalities to suggest acute large  vessel distribution infarcts.  A chronically totally occluded graft to his  RCA circulation was opened.  The patient did not suffer a significant  myocardial infarction with peak CK of approximately 250-300.  In the opinion  of the cardiology staff the  patient did not suffer an acute myocardial  infarction of the degree necessary to cause a VT arrest.   PAST MEDICAL HISTORY:  1.  Atherosclerotic coronary artery disease.  2. History of coronary artery      bypass grafting.  3. Ischemic cardiomyopathy.  4. Class 1 congestive      heart failure.  5. Stable angina pectoris class 1.  6. Dyslipidemia.   MEDICATIONS:  1.  Aspirin 325 mg daily.  2. Metoprolol 25 mg b.i.d.  3. Lisinopril 20 mg      daily.  4. Zocor 40 mg daily.  5. Protonix 40 mg daily.  6. Finasteride      5 mg daily.  7. Digoxin 0.125 mg daily.  8. Wellbutrin.  9. Plavix 75 mg      daily.  10. Nitroglycerin p.r.n.  11. Cipro 250 mg p.o. b.i.d., last day      of therapy.   NO KNOWN DRUG ALLERGIES.   SOCIAL HISTORY:  He is married, works full-time as a Archivist.  He  denies alcohol.  He continues to smoke.   FAMILY HISTORY:  Positive for coronary artery disease.   REVIEW OF SYSTEMS:  In general no weight gain, weight loss, no fevers,  sweats or chills.  Musculoskeletal: Minor arthritic complaints.  Skin: No  easy bruisability.  Pulmonary: Class 1 congestive heart failure symptoms.  No asthma.  Cardiovascular: No chronic angina.  No syncope except for HPI.  GI: Mild dyspepsia.  GU: No burning, frequency at the current time.  Endo:  No diabetes or thyroid disease symptoms.  Neuro: No CVA or TIA symptoms.  HEENT: Negative.  Psych: Neither depressed nor manic.   PHYSICAL EXAMINATION:  Well-developed, well-nourished white male in no acute  distress, seen in the coronary care unit area in bed.  Blood pressure  108/56, heart rate 48 and regular with frequent PVCs.  Temperature afebrile,  saturating 96% on room air.  Respirations 12-16.  Atraumatic, normocephalic.  Pupils equal, round and reactive to light and accommodation.  Midline nasal  septum.  Moist mucous membranes and oropharynx.  Neck: Supple, 2+ carotids,  no bruit, no JVP, no thyromegaly, no adenopathy.  Back:  Negative CAT.  Chest: Normal male.  Respiratory: Clear to auscultation and percussion.  Cardiovascular: Regular rate and rhythm without murmur.  Normal S1 and S2.  No rubs, gallops or thrills.  Abdomen: Soft, nontender, positive bowel  sounds, no hepatosplenomegaly, no masses or bruits.  GU: Deferred.  Extremities: No clubbing, cyanosis or edema.  Peripheral pulses 2+ and  equal.  Skin: No areas of breakdown or decubitus.  Neurologic: Alert and  oriented x4, motor and sensory not focal.  Cranial nerves II-XII are intact.   Electrocardiogram revealed sinus bradycardia, frequent PVCs, left axis  deviation with right bundle branch block, there is evidence for old  inferior/posterior lateral infarction.  Telemetry revealed sinus  bradycardia, frequent PVCs, no recurrent ventricular tachycardia.  End field  electrocardiogram reveals sustained wide complex tachycardia consistent with  monomorphic ventricular tachycardia.   LABORATORY DATA:  Peak CK was approximately 250, suggestive only of small  non-Q-wave infarction.  Potassium 4.6, creatinine 1.0, white count 9700,  platelet count 190,000, hematocrit 38.1%.  Urine is positive for white blood  cells 7-10, a few bacteria, some bloody urine, probably secondary to  catheter trauma, positive nitrates and leukocyte esterase.  The patient has  been on Cipro now for 48 hours.  Off Coumadin, last INR two days ago was 1.6  and falling.   IMPRESSION:  1.  Sustained monomorphic ventricular tachycardia, episode of symptomatic      VT, not associated with reversible cause such as large vessel ST      elevation infarct or reversible medication or electrolyte abnormality.      The patient required in field cardioversion, as such this is class I      indication for ICD implantation.  2. Symptomatic bradycardia.  The      patient has episodes of slow heart rate, mildly symptomatic.  Given the     diagnosis of VT and his lowered heart rate, I think dual  chamber      pacemaker implantation would be appropriate.  3. Ischemic      cardiomyopathy.  The patient has chronic ischemic cardiomyopathy, EF of      20-25%.  Fortunately he has really had class 1 to class 2 congestive      heart failure easily managed on medications.  As such, even though he      has a  widened QRS, he is really not a candidate for biventricular      resynchronization at the current time.   PLAN:  Given the risk of sudden cardiac death in this patient, I think that  ICD implantation is in his best interest.  After hearing risks and benefits  of the procedure, the patient elects to proceed.  The patient's dirty urine  has been covered with an appropriate antibiotic for the last 48 hours.  He  is not febrile, does not have an elevated white count and I think it is very  appropriate to proceed at this time for ICD implantation.  The patient is on  Plavix which will increase his risk for bleeding however, he is going to be  on Plavix for the long-term and I do not think we should wait until we have  a window that we can take him off Plavix before we put in his secondary  prevention ICD.  As such, I would be putting in a dual chamber ICD and  programmed it on multiple zones of therapy.      MEP/MEDQ  D:  06/17/2004  T:  06/17/2004  Job:  [id number]

## 2010-09-20 NOTE — Discharge Summary (Signed)
[name], [name] NO.:  [id number]   MEDICAL RECORD NO.:  [id number]          PATIENT TYPE:  INP   LOCATION:  4737                         FACILITY:  [facility]   PHYSICIAN:  Madaline Savage, M.D.DATE OF BIRTH:  Feb 12, 1946   DATE OF ADMISSION:  06/14/2004  DATE OF DISCHARGE:  06/19/2004                                 DISCHARGE SUMMARY   HISTORY OF PRESENT ILLNESS:  [name] is a 65 year old white male  patient of Dr. Elsie [name] who is also followed at the [place].  He came to the  emergency room by EMS secondary to chest pain.  He has known coronary artery  disease.  He had an MI at the young age of 49.  He had a CABG x3 in 1997.  His last catheterization was April 2003 at which time he had an occluded FCG  to his OM.  He had a patent LIMA to his LAD and a patent SVG to his RCA.  His EF was 30-35% at that time.  He apparently had developed sudden onset of  substernal chest pain like a concrete block on his chest.  He tried to drive  himself to [facility], but he had to pull over.  EMS was called by  himself, and he was found to be in [facility].  He was shocked once in sinus  rhythm and then brought to the emergency room.  He was seen by Dr. Susa Griffins and taken to the cath lab.  It appeared that he had monomorphic  VT.  His cath revealed that his LIMA to his LAD was okay.  His SVG to his  RCA was stenosed.  He had an occluded SVG to his OM that was old.  He  subsequently underwent DES Cypher stenting with a 3.0x33 and 3.0x33.  Two  stents in his SVG to his RCA.  Please see Dr. Kandis Cocking note for complete  details.  He then had an EP consult by Dr. Severiano Gilbert which was done on June 17, 2004.  It was decided that he had sustained VT, monomorphic, and he  recommended him undergoing an AICD.  This was placed on June 17, 2004.  He had [employer]onic #[id number] dual-chamber ICD placed serial #[id number].  His  EF was 20-25% at cath.  He was seen by cardiac  rehab, walked in the halls.  He progressed without any complications.  On June 19, 2004, he was seen  by Dr. Elsie [name] and decision was made to discharge him home.  To note, the  patient was on Coumadin for CVA prophylaxis.  He apparently had had a CVA  after his coronary artery bypass grafting in 1997.  He is off that now with  aspirin and Plavix  medications secondary to his stenting.  Decision will be  made at a later date after he is through with his Plavix, if he should go  back on the Coumadin.  Blood pressure on the day of discharge was running on  the low side since his hospitalization, 108/72, heart rate 58, respirations  18, temperature 97.8,  O2 saturations 97%.   LABORATORY DATA:  Sodium 138, potassium 3.6, chloride 108, glucose 107, BUN  14, creatinine 0.9, magnesium 2.0.  Hemoglobin 13.2, hematocrit 37.7, WBC  7.8, platelets 178,000.  CK MB's were positive:  #1 was 225/27.5, troponin  1.89; #2 was 175/16.2, troponin 1.20; #3 124/8.5, troponin 8.1.  Total  cholesterol 96, triglycerides 111, HDL 37, LDL 37, TSH 0.969.  Chest x-ray  showed no pneumothorax plus pacemaker placement.   DISCHARGE MEDICATIONS:  1.  Lisinopril 20 mg once daily.  2.  Zocor 40 mg q.h.s.  3.  Protonix 40 mg once daily.  He can use Prilosec instead, 20 mg once      daily, over-the-counter.  4.  Proscar 5 mg once daily.  5.  Digoxin 0.125 mg once daily.  6.  Wellbutrin SR 150 mg b.i.d.  7.  Plavix 75 mg once daily.  He should take that for at least six months.      He should not stop.  8.  Spirolactone 25 mg once daily.  9.  Aspirin 81 mg once daily.  10. Toprol XL 25 mg once daily.   DISCHARGE INSTRUCTIONS:  1.  He should not return to work.  2.  He should do no strenuous activity, pushing, pulling or lifting.  3.  He should be on a low-sodium, low-saturated fat and low-transfatty acid      diet.  4.  If he has any problems with his groin, he can give [place] a call.  5.  He should keep his wound  clean and dry and may wash it with soap and      water.  6.  In five days, he will follow up for wound check with myself on February      22 at 9:30.  He will follow up with Dr. Elsie [name] on March 1 at 3:15.   DISCHARGE DIAGNOSES:  1.  Acute myocardial infarction with subsequent emergent cardiac      catheterization.  He was placed in the PEXEL trial.  He has subsequent      two Cypher stents placed, 3.0x33 each, to his saphenous vein graft to      his right coronary artery.  Prior coronary artery bypass grafting x3 in      1997 at this cath.  He has a known occluded saphenous vein graft to his      obtuse marginal.  His LIMA to his left anterior descending was patent.  2.  Ischemic cardiomyopathy with an ejection fraction of 20-25%.  3.  Monomorphic sustained VTACH requiring AICD placement performed by Dr.      Launa Grill.  4.  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  Prior was on Coumadin.  5.  Tobacco smoking.  6.  History of hypertension.  However, this has not been a problem this      hospitalization.  His blood pressure has been on the borderline low      side.  7.  History of dyslipidemia with low HDL.  8.  Chronic back pain on chronic narcotic medication.      BB/MEDQ  D:  06/19/2004  T:  06/19/2004  Job:  [id number]   cc:   Michel Harrow, M.D.  [facility]

## 2010-09-20 NOTE — Op Note (Signed)
[place]  Patient:    Andrew [name], Andrew [name] Visit Number: [id number] MRN: [id number]          Service Type: DSU Location: [facility] Attending Physician:  Colbert Ewing Dictated by:   Loreta Ave, M.D. Proc. 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  POSTOPERATIVE DIAGNOSIS:  Chronic impingement, left shoulder, with degenerative disease of the acromioclavicular joint and partial thickness tear, rotator cuff.  OPERATIVE PROCEDURE:  Left shoulder exam under anesthesia and arthroscopy with debridement of the rotator cuff.  Arthroscopic acromioplasty with CA ligament release.  Excision of distal clavicle.  SURGEON:  Loreta Ave, M.D.  ASSISTANT:  Arlys John D. Petrarca, P.A.-C.  ANESTHESIA:  General.  BLOOD LOSS:  Minimal.  SPECIMENS:  None.  CULTURES:  None.  COMPLICATIONS:  None.  DRESSINGS:  Soft compressive with shoulder immobilizer.  PROCEDURE:  The patient was brought to the operating room and after adequate anesthesia had been obtained, the left arm was examined.  Some mild restriction of motion with forward flexion and abduction. This was easily manipulated to full motion, maintaining a stable shoulder with breakup of some mild subacromial adhesions.  He was placed in a beachchair position in the shoulder position and prepped and draped in the usual sterile fashion.  Three standard arthroscopic portals, anterior and posterolateral.  The shoulder was entered with a blunt obturator, distended and inspected.  Undersurface tearing, crusting of the region, and rotator cuff were debrided.  No full thickness tears.  Normal variant anterior labrum, blunting into the subscapularis superiorly but a normal inferior-anterior  labrum below this. Some hypermobility at the biceps anchor but still intact.  The articular cartilage looked good, as did capsular ligamentous structures.  After the cuff was debrided on the bottom, the cannula was redirected subacromially.  Chronic impingement with abrasive tearing on the top of the cuff but no structural tears within the cuff.  Type 2 acromion.  The bursa resected and cuff debrided.  Acromioplasty ______ high-speed bur, reducing the CA ligament with cautery.  The distal clavicle exposed with distal clavicle osteolysis and grade 3 chondromalacia.  Lateral ______ sharply resected.  Adequate ______ compression and clavicle excision confirmed on viewing from all portals. Meticulous hemostasis because of his history of Coumadin use.  Instruments and fluid were removed.  Pulled her shoulder and bursa and injected Marcaine.  The portals were closed with 4-0 nylon.  A sterile compressive dressing and sling were applied.  Anesthesia was reversed.  He brought to the recovery room. Tolerated the procedure well, and there were no complications. Dictated by:   Loreta Ave, M.D. Attending Physician:  Colbert Ewing DD:  12/31/00 TD:  01/01/01 Job: [id number]

## 2010-09-23 ENCOUNTER — Other Ambulatory Visit: Payer: Self-pay | Admitting: Internal Medicine

## 2010-09-23 DIAGNOSIS — I5022 Chronic systolic (congestive) heart failure: Secondary | ICD-10-CM

## 2010-09-23 DIAGNOSIS — I440 Atrioventricular block, first degree: Secondary | ICD-10-CM

## 2010-09-23 DIAGNOSIS — Z79899 Other long term (current) drug therapy: Secondary | ICD-10-CM

## 2010-12-02 ENCOUNTER — Other Ambulatory Visit: Payer: Self-pay

## 2010-12-02 MED ORDER — POTASSIUM CHLORIDE 10 MEQ PO TBCR: 10.0000 meq | EXTENDED_RELEASE_TABLET | Freq: Two times a day (BID) | ORAL | Status: DC

## 2010-12-02 MED ORDER — FUROSEMIDE 20 MG PO TABS: 20.0000 mg | ORAL_TABLET | Freq: Every day | ORAL | Status: DC

## 2010-12-02 NOTE — Telephone Encounter (Signed)
Needs a refill on furosemide and potassium.

## 2011-10-20 ENCOUNTER — Emergency Department: Payer: Self-pay | Admitting: Emergency Medicine

## 2011-10-20 LAB — URINALYSIS, COMPLETE
Bilirubin,UR: NEGATIVE
Blood: NEGATIVE
Glucose,UR: NEGATIVE mg/dL (ref 0–75)
Nitrite: NEGATIVE
RBC,UR: 2 /HPF (ref 0–5)

## 2011-10-20 LAB — COMPREHENSIVE METABOLIC PANEL
Albumin: 4 g/dL (ref 3.4–5.0)
Anion Gap: 9 (ref 7–16)
Calcium, Total: 8.6 mg/dL (ref 8.5–10.1)
Co2: 26 mmol/L (ref 21–32)
Creatinine: 1.46 mg/dL — ABNORMAL HIGH (ref 0.60–1.30)
Glucose: 114 mg/dL — ABNORMAL HIGH (ref 65–99)
Osmolality: 280 (ref 275–301)
SGOT(AST): 24 U/L (ref 15–37)
Total Protein: 7.3 g/dL (ref 6.4–8.2)

## 2011-10-20 LAB — CBC
HGB: 15.4 g/dL (ref 13.0–18.0)
MCHC: 33.7 g/dL (ref 32.0–36.0)
RBC: 4.95 10*6/uL (ref 4.40–5.90)
RDW: 15.3 % — ABNORMAL HIGH (ref 11.5–14.5)

## 2011-12-22 ENCOUNTER — Telehealth: Payer: Self-pay | Admitting: Internal Medicine

## 2011-12-22 NOTE — Telephone Encounter (Signed)
12-22-11 PT WAS SENT MAY RECALL RE DEFIB CK, NO RESPONSE, LMM @ 436PM FOR PT TO CALL TO SET UP, SCHEDULE WITH [facility]/MT

## 2012-03-04 NOTE — Telephone Encounter (Signed)
03-04-12 called pt to set up defib, pt having check done at [facility] now/mt

## 2014-11-26 ENCOUNTER — Emergency Department
Admission: EM | Admit: 2014-11-26 | Discharge: 2014-11-26 | Disposition: A | Discharge status: Home / Self Care | Payer: Medicare Other | Attending: Emergency Medicine | Admitting: Emergency Medicine

## 2014-11-26 DIAGNOSIS — Z23 Encounter for immunization: Secondary | ICD-10-CM | POA: Diagnosis not present

## 2014-11-26 DIAGNOSIS — Z792 Long term (current) use of antibiotics: Secondary | ICD-10-CM | POA: Diagnosis not present

## 2014-11-26 DIAGNOSIS — Z87891 Personal history of nicotine dependence: Secondary | ICD-10-CM | POA: Diagnosis not present

## 2014-11-26 DIAGNOSIS — Z79899 Other long term (current) drug therapy: Secondary | ICD-10-CM | POA: Insufficient documentation

## 2014-11-26 DIAGNOSIS — Z7982 Long term (current) use of aspirin: Secondary | ICD-10-CM | POA: Insufficient documentation

## 2014-11-26 DIAGNOSIS — W540XXA Bitten by dog, initial encounter: Secondary | ICD-10-CM | POA: Insufficient documentation

## 2014-11-26 DIAGNOSIS — Y9389 Activity, other specified: Secondary | ICD-10-CM | POA: Diagnosis not present

## 2014-11-26 DIAGNOSIS — Y998 Other external cause status: Secondary | ICD-10-CM | POA: Insufficient documentation

## 2014-11-26 DIAGNOSIS — I10 Essential (primary) hypertension: Secondary | ICD-10-CM | POA: Diagnosis not present

## 2014-11-26 DIAGNOSIS — S61451A Open bite of right hand, initial encounter: Secondary | ICD-10-CM | POA: Insufficient documentation

## 2014-11-26 DIAGNOSIS — Y9289 Other specified places as the place of occurrence of the external cause: Secondary | ICD-10-CM | POA: Insufficient documentation

## 2014-11-26 MED ORDER — TRAMADOL HCL 50 MG PO TABS
50.0000 mg | ORAL_TABLET | Freq: Once | ORAL | Status: AC
  Administered 2014-11-26: 50 mg via ORAL
  Filled 2014-11-26: qty 1

## 2014-11-26 MED ORDER — BACITRACIN ZINC 500 UNIT/GM EX OINT
TOPICAL_OINTMENT | CUTANEOUS | Status: DC
Start: 2014-11-26 — End: 2014-11-27
  Filled 2014-11-26: qty 0.9

## 2014-11-26 MED ORDER — BACITRACIN-NEOMYCIN-POLYMYXIN OINTMENT TUBE
TOPICAL_OINTMENT | Freq: Once | CUTANEOUS | Status: AC
  Administered 2014-11-26: 22:00:00 via TOPICAL

## 2014-11-26 MED ORDER — LIDOCAINE HCL (PF) 1 % IJ SOLN
INTRAMUSCULAR | Status: AC
  Filled 2014-11-26: qty 5

## 2014-11-26 MED ORDER — TETANUS-DIPHTHERIA TOXOIDS TD 5-2 LFU IM INJ
0.5000 mL | INJECTION | Freq: Once | INTRAMUSCULAR | Status: AC
  Administered 2014-11-26: 0.5 mL via INTRAMUSCULAR
  Filled 2014-11-26: qty 0.5

## 2014-11-26 MED ORDER — OXYCODONE-ACETAMINOPHEN 7.5-325 MG PO TABS: 1.0000 | ORAL_TABLET | Freq: Four times a day (QID) | ORAL | Status: DC | PRN

## 2014-11-26 MED ORDER — AMOXICILLIN-POT CLAVULANATE 875-125 MG PO TABS
1.0000 | ORAL_TABLET | Freq: Once | ORAL | Status: AC
  Administered 2014-11-26: 1 via ORAL
  Filled 2014-11-26: qty 1

## 2014-11-26 MED ORDER — AMOXICILLIN-POT CLAVULANATE 875-125 MG PO TABS: 1.0000 | ORAL_TABLET | Freq: Two times a day (BID) | ORAL | Status: DC

## 2014-11-26 NOTE — ED Provider Notes (Signed)
[facility] Emergency Department Provider Note ____________________________________________  Time seen: Approximately 9:15 PM  I have reviewed the triage vital signs and the nursing notes.   HISTORY  Chief Complaint Animal Bite   HPI Andrew [name] is a 69 y.o. male patient with a laceration segmented to a dog bite between the fourth and fifth metacarpal dorsal aspect of the right hand. Dog is a family pet immunizations up to date. Patient state he was playing with a dog and instead occurred. Patient is rating his pain as a 1/10. Patient denies any loss of function or loss sensation. Incident occurred approximately 5 hours ago. Patient is right-hand dominant.   Past Medical History  Diagnosis Date  . Ischemic cardiomyopathy     a. EF 25-35%;   b. echo 3/11: EF 35%, mild LVH, severe inf/post HK, lat and apical HK, grade 1 diast dysfxn, mild AI, mild to mod MR, PASP 32  . CAD (coronary artery disease)     a. s/p CABG 1997;  b. s/p PCI to [facility] 2006;  c. s/p BMS x 3 to S-RCA 10/10;  d.  Myoview 2010: EF 24%, basal inf, mid inf. apical inf, basal inf-lat and mid IL scar, no isch;  e. cath 1/11: S-RCA occluded, S-OM chronically occluded, L-LAD ok with L-R collaterals (med. Tx)  . Ventricular tachycardia     a. s/p AICD 2006;   b. Amio intolerant due to liver and thyroid issues;   c. 2/12:  quinidine added to mexilitine  (consider Tikosyn in future??)  . Systolic CHF, chronic   . Atrial fibrillation   . HTN (hypertension)   . HLD (hyperlipidemia)     Patient Active Problem List   Diagnosis Date Noted  . Chronic systolic heart failure 08/30/2010  . HYPOTENSION 07/23/2009  . TRANSAMINASES, SERUM, ELEVATED 07/23/2009  . HYPERCHOLESTEROLEMIA  IIA 06/21/2009  . CAD, ARTERY BYPASS GRAFT 06/21/2009  . CARDIOMYOPATHY, ISCHEMIC 06/21/2009  . AV BLOCK, 1ST DEGREE 06/21/2009  . 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 VENTRICULAR TACHYCARDIA  06/21/2009  . DYSPNEA ON EXERTION 06/21/2009  . ICD - IN SITU 06/21/2009    Past Surgical History  Procedure Laterality Date  . Coronary artery bypass graft  1997  . Cardiac catheterization  2006,02/2009  . Medtronic implantable cardioverter-dibrillator   2006    with sprint fidelis lead    Current Outpatient Rx  Name  Route  Sig  Dispense  Refill  . acetaminophen (TYLENOL) 325 MG tablet   Oral   Take 650 mg by mouth every 6 (six) hours as needed.           Marland Kitchen amoxicillin-clavulanate (AUGMENTIN) 875-125 MG per tablet   Oral   Take 1 tablet by mouth 2 (two) times daily.   20 tablet   0   . aspirin 81 MG tablet   Oral   Take 81 mg by mouth daily.           . carvedilol (COREG) 12.5 MG tablet   Oral   Take 12.5 mg by mouth 2 (two) times daily with a meal.           . digoxin (LANOXIN) 0.125 MG tablet   Oral   Take 125 mcg by mouth daily.           . fish oil-omega-3 fatty acids 1000 MG capsule   Oral   Take 2 g by mouth daily.           Marland Kitchen  furosemide (LASIX) 20 MG tablet   Oral   Take 1 tablet (20 mg total) by mouth daily.   90 tablet   3   . isosorbide mononitrate (IMDUR) 30 MG CR tablet   Oral   Take 30 mg by mouth as needed.           . mexiletine (MEXITIL) 250 MG capsule   Oral   Take 250 mg by mouth 2 (two) times daily.           . niacin 500 MG tablet   Oral   Take 500 mg by mouth daily with breakfast.           . nitroGLYCERIN (NITROSTAT) 0.4 MG SL tablet   Sublingual   Place 0.4 mg under the tongue every 5 (five) minutes as needed.           Marland Kitchen oxyCODONE-acetaminophen (PERCOCET) 7.5-325 MG per tablet   Oral   Take 1 tablet by mouth every 6 (six) hours as needed for severe pain.   12 tablet   0   . potassium chloride (KLOR-CON) 10 MEQ CR tablet   Oral   Take 1 tablet (10 mEq total) by mouth 2 (two) times daily.   180 tablet   3   . prasugrel (EFFIENT) 10 MG TABS   Oral   Take 10 mg by mouth daily.           .  rosuvastatin (CRESTOR) 10 MG tablet   Oral   Take 10 mg by mouth daily. 1/2 tablet daily          . Tamsulosin HCl (FLOMAX) 0.4 MG CAPS   Oral   Take 0.4 mg by mouth daily.             Allergies Review of patient's allergies indicates no known allergies.  Family History  Problem Relation Age of Onset  . Heart failure Mother     congestive  . Heart attack Brother 59    hx of CABG    Social History History  Substance Use Topics  . Smoking status: Former Smoker    Quit date: 05/05/2002  . Smokeless tobacco: Not on file  . Alcohol Use: No    Review of Systems Constitutional: No fever/chills Eyes: No visual changes. ENT: No sore throat. Cardiovascular: Denies chest pain. Respiratory: Denies shortness of breath. Gastrointestinal: No abdominal pain.  No nausea, no vomiting.  No diarrhea.  No constipation. Genitourinary: Negative for dysuria. Musculoskeletal: Negative for back pain. Skin: Negative for rash. Laceration secondary to a dog bite to the right hand. Neurological: Negative for headaches, focal weakness or numbness. Endocrine:Hypertension and hyperlipidemia. Hematological/Lymphatic: Allergic/Immunilogical: None  10-point ROS otherwise negative.  ____________________________________________   PHYSICAL EXAM:  VITAL SIGNS: ED Triage Vitals  Enc Vitals Group     BP 11/26/14 1939 117/78 mmHg     Pulse Rate 11/26/14 1939 71     Resp --      Temp 11/26/14 1939 97.7 F (36.5 C)     Temp Source 11/26/14 1939 Oral     SpO2 11/26/14 1939 95 %     Weight 11/26/14 1939 191 lb (86.637 kg)     Height 11/26/14 1939 5\' 7"  (1.702 m)     Head Cir --      Peak Flow --      Pain Score 11/26/14 1950 1     Pain Loc --      Pain Edu? --  Excl. in GC? --     Constitutional: Alert and oriented. Well appearing and in no acute distress. Eyes: Conjunctivae are normal. PERRL. EOMI. Head: Atraumatic. Nose: No congestion/rhinnorhea. Mouth/Throat: Mucous membranes  are moist.  Oropharynx non-erythematous. Neck: No stridor. No cervical spine tenderness to palpation. Hematological/Lymphatic/Immunilogical: No cervical lymphadenopathy. Cardiovascular: Normal rate, regular rhythm. Grossly normal heart sounds.  Good peripheral circulation. Respiratory: Normal respiratory effort.  No retractions. Lungs CTAB. Gastrointestinal: Soft and nontender. No distention. No abdominal bruits. No CVA tenderness. Musculoskeletal: No lower extremity tenderness nor edema.  No joint effusions. Neurologic:  Normal speech and language. No gross focal neurologic deficits are appreciated. No gait instability. Skin:  Skin is warm, dry and intact. No rash noted. 1.5 L laceration between the fourth and fifth metacarpal head of the right hand. There is neurovascular intact. Patient free and equal range of motion. Psychiatric: Mood and affect are normal. Speech and behavior are normal.  ____________________________________________   LABS (all labs ordered are listed, but only abnormal results are displayed)  Labs Reviewed - No data to display ____________________________________________  EKG   ____________________________________________  RADIOLOGY   ____________________________________________   PROCEDURES  Procedure(s) performed: See  procedure note.  Critical Care performed: No LACERATION REPAIR Performed by: Joni Reining Authorized by: Joni Reining Consent: Verbal consent obtained. Risks and benefits: risks, benefits and alternatives were discussed Consent given by: patient Patient identity confirmed: provided demographic data Prepped and Draped in normal sterile fashion Wound explored  Laceration Location: Dose aspect the right hand between the fourth and fifth metacarpal head  Laceration Length: 1.5 cm  No Foreign Bodies seen or palpated  Anesthesia: local infiltration  Local anesthetic: lidocaine 1% without epinephrine  Anesthetic total: 4  mL Irrigation method: Copious  Syringe irrigation with normal saline.  Amount of cleaning: standard  Skin closure: 3-0 nylon Number of sutures: 3 Technique skin was loosely approximated with simple sutures Patient tolerance: Patient tolerated the procedure well with no immediate complications.  ____________________________________________   INITIAL IMPRESSION / ASSESSMENT AND PLAN / ED COURSE  Pertinent labs & imaging results that were available during my care of the patient were reviewed by me and considered in my medical decision making (see chart for details).  Laceration right hand secondary to dog bite. Positive patient of the wrists or infection. Discussed loosely approximating the wound. Patient started with Augmentin and Percocet prior to leaving. Patient advised to monitor the area closely for signs symptoms or infection. He is advised to follow-up in 2 days for wound check. Sooner if condition worsens. Suture removal in 10 days. Tetanus shot given prior to departure.  FINAL CLINICAL IMPRESSION(S) / ED DIAGNOSES  Final diagnoses:  Dog bite of right hand without complication, initial encounter      JIDENNA FIGGS, PA-C 11/26/14 2201  Emily Filbert, MD 11/26/14 2259

## 2014-11-26 NOTE — ED Notes (Signed)
Dog bite to right hand. Dog is patient's and shots are up to date. Patient has had tetanus updated. Bite occurred around 4PM.

## 2014-11-26 NOTE — ED Notes (Signed)
Reports was weeding outside and tossing to the side for the dog and dog got excited and accidentally bite him.  Reports dogs shots are up to date.

## 2014-11-26 NOTE — ED Notes (Addendum)
c-com notified and will notify animal control

## 2015-01-13 ENCOUNTER — Emergency Department
Admission: EM | Admit: 2015-01-13 | Discharge: 2015-01-13 | Disposition: A | Discharge status: Home / Self Care | Payer: Medicare Other | Attending: Emergency Medicine | Admitting: Emergency Medicine

## 2015-01-13 DIAGNOSIS — Y846 Urinary catheterization as the cause of abnormal reaction of the patient, or of later complication, without mention of misadventure at the time of the procedure: Secondary | ICD-10-CM | POA: Insufficient documentation

## 2015-01-13 DIAGNOSIS — Z792 Long term (current) use of antibiotics: Secondary | ICD-10-CM | POA: Diagnosis not present

## 2015-01-13 DIAGNOSIS — T839XXA Unspecified complication of genitourinary prosthetic device, implant and graft, initial encounter: Secondary | ICD-10-CM

## 2015-01-13 DIAGNOSIS — Z79899 Other long term (current) drug therapy: Secondary | ICD-10-CM | POA: Diagnosis not present

## 2015-01-13 DIAGNOSIS — I10 Essential (primary) hypertension: Secondary | ICD-10-CM | POA: Diagnosis not present

## 2015-01-13 DIAGNOSIS — Z7982 Long term (current) use of aspirin: Secondary | ICD-10-CM | POA: Diagnosis not present

## 2015-01-13 DIAGNOSIS — R339 Retention of urine, unspecified: Secondary | ICD-10-CM | POA: Diagnosis present

## 2015-01-13 DIAGNOSIS — T83098A Other mechanical complication of other indwelling urethral catheter, initial encounter: Secondary | ICD-10-CM | POA: Insufficient documentation

## 2015-01-13 DIAGNOSIS — Z87891 Personal history of nicotine dependence: Secondary | ICD-10-CM | POA: Insufficient documentation

## 2015-01-13 LAB — BASIC METABOLIC PANEL
Anion gap: 7 (ref 5–15)
BUN: 19 mg/dL (ref 6–20)
CHLORIDE: 101 mmol/L (ref 101–111)
CO2: 26 mmol/L (ref 22–32)
CREATININE: 1 mg/dL (ref 0.61–1.24)
Calcium: 8.7 mg/dL — ABNORMAL LOW (ref 8.9–10.3)
GFR calc non Af Amer: >60 mL/min (ref >60)
Glucose, Bld: 118 mg/dL — ABNORMAL HIGH (ref 65–99)
Potassium: 3.8 mmol/L (ref 3.5–5.1)
SODIUM: 134 mmol/L — AB (ref 135–145)

## 2015-01-13 NOTE — Discharge Instructions (Signed)
You were seen in the Emergency Department (ED) for a problem with your catheter.   Please read through the included information and follow up with your doctor as recommended in these papers; your doctor will see you in clinic and help you determine when it is time to have the catheter removed.  If you stop producing urine in the bag or if you develop other symptoms that concern you, such as fever, chills, persistent vomiting, or severe abdominal pain, please return immediately to the Emergency Department.

## 2015-01-13 NOTE — ED Notes (Signed)
Blood collected using butterfly in the right AC. Patient tolerated well. Wife at bedside.

## 2015-01-13 NOTE — ED Provider Notes (Signed)
[facility] Emergency Department Provider Note REMINDER - THIS NOTE IS NOT A FINAL MEDICAL RECORD UNTIL IT IS SIGNED. UNTIL THEN, THE CONTENT BELOW MAY REFLECT INFORMATION FROM A DOCUMENTATION TEMPLATE, NOT THE ACTUAL PATIENT VISIT. ____________________________________________  Time seen: Approximately 2:14 PM  I have reviewed the triage vital signs and the nursing notes.   HISTORY  Chief Complaint Urinary Retention    HPI Andrew [name] is a 69 y.o. male who reports that he had urologic surgery on Wednesday at the [place]. He had surgery because of bladder cancer, and he reports that for the last few days he's had a Foley catheter in place. He reports that today while walking he felt pressure and pain in the area of his lower bladder and penis, he reports that this is gone now but he came for evaluation of the catheter. He does report that it continues to drain well, he seen dark and somewhat bloody urine, but reports that his urine has been this way since the time of surgery. No fevers or chills. No abdominal pain now. No nausea or vomiting. He reports he otherwise feels well and at the present time the catheter seems to be working okay.   Past Medical History  Diagnosis Date  . Ischemic cardiomyopathy     a. EF 25-35%;   b. echo 3/11: EF 35%, mild LVH, severe inf/post HK, lat and apical HK, grade 1 diast dysfxn, mild AI, mild to mod MR, PASP 32  . CAD (coronary artery disease)     a. s/p CABG 1997;  b. s/p PCI to [facility] 2006;  c. s/p BMS x 3 to S-RCA 10/10;  d.  Myoview 2010: EF 24%, basal inf, mid inf. apical inf, basal inf-lat and mid IL scar, no isch;  e. cath 1/11: S-RCA occluded, S-OM chronically occluded, L-LAD ok with L-R collaterals (med. Tx)  . Ventricular tachycardia     a. s/p AICD 2006;   b. Amio intolerant due to liver and thyroid issues;   c. 2/12:  quinidine added to mexilitine  (consider Tikosyn in future??)  . Systolic CHF, chronic   . Atrial  fibrillation   . HTN (hypertension)   . HLD (hyperlipidemia)     Patient Active Problem List   Diagnosis Date Noted  . Chronic systolic heart failure 08/30/2010  . HYPOTENSION 07/23/2009  . TRANSAMINASES, SERUM, ELEVATED 07/23/2009  . HYPERCHOLESTEROLEMIA  IIA 06/21/2009  . CAD, ARTERY BYPASS GRAFT 06/21/2009  . CARDIOMYOPATHY, ISCHEMIC 06/21/2009  . AV BLOCK, 1ST DEGREE 06/21/2009  . 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 VENTRICULAR TACHYCARDIA 06/21/2009  . DYSPNEA ON EXERTION 06/21/2009  . ICD - IN SITU 06/21/2009    Past Surgical History  Procedure Laterality Date  . Coronary artery bypass graft  1997  . Cardiac catheterization  2006,02/2009  . Medtronic implantable cardioverter-dibrillator   2006    with sprint fidelis lead  . Bladder tumor excision      Current Outpatient Rx  Name  Route  Sig  Dispense  Refill  . acetaminophen (TYLENOL) 325 MG tablet   Oral   Take 650 mg by mouth every 6 (six) hours as needed.           Marland Kitchen amoxicillin-clavulanate (AUGMENTIN) 875-125 MG per tablet   Oral   Take 1 tablet by mouth 2 (two) times daily.   20 tablet   0   . aspirin 81 MG tablet   Oral   Take 81  mg by mouth daily.           . carvedilol (COREG) 12.5 MG tablet   Oral   Take 12.5 mg by mouth 2 (two) times daily with a meal.           . digoxin (LANOXIN) 0.125 MG tablet   Oral   Take 125 mcg by mouth daily.           . fish oil-omega-3 fatty acids 1000 MG capsule   Oral   Take 2 g by mouth daily.           . furosemide (LASIX) 20 MG tablet   Oral   Take 1 tablet (20 mg total) by mouth daily.   90 tablet   3   . isosorbide mononitrate (IMDUR) 30 MG CR tablet   Oral   Take 30 mg by mouth as needed.           . mexiletine (MEXITIL) 250 MG capsule   Oral   Take 250 mg by mouth 2 (two) times daily.           . niacin 500 MG tablet   Oral   Take 500 mg by mouth daily with breakfast.           . nitroGLYCERIN  (NITROSTAT) 0.4 MG SL tablet   Sublingual   Place 0.4 mg under the tongue every 5 (five) minutes as needed.           Marland Kitchen oxyCODONE-acetaminophen (PERCOCET) 7.5-325 MG per tablet   Oral   Take 1 tablet by mouth every 6 (six) hours as needed for severe pain.   12 tablet   0   . potassium chloride (KLOR-CON) 10 MEQ CR tablet   Oral   Take 1 tablet (10 mEq total) by mouth 2 (two) times daily.   180 tablet   3   . prasugrel (EFFIENT) 10 MG TABS   Oral   Take 10 mg by mouth daily.           . rosuvastatin (CRESTOR) 10 MG tablet   Oral   Take 10 mg by mouth daily. 1/2 tablet daily          . Tamsulosin HCl (FLOMAX) 0.4 MG CAPS   Oral   Take 0.4 mg by mouth daily.             Allergies Amiodarone  Family History  Problem Relation Age of Onset  . Heart failure Mother     congestive  . Heart attack Brother 65    hx of CABG    Social History Social History  Substance Use Topics  . Smoking status: Former Smoker    Quit date: 05/05/2002  . Smokeless tobacco: None  . Alcohol Use: No    Review of Systems Constitutional: No fever/chills Eyes: No visual changes. ENT: No sore throat. Cardiovascular: Denies chest pain. Respiratory: Denies shortness of breath. Gastrointestinal: No abdominal pain.  No nausea, no vomiting.  No diarrhea.  No constipation. Genitourinary: See history of present illness Musculoskeletal: Negative for back pain. Skin: Negative for rash. Neurological: Negative for headaches, focal weakness or numbness.  10-point ROS otherwise negative.  ____________________________________________   PHYSICAL EXAM:  VITAL SIGNS: ED Triage Vitals  Enc Vitals Group     BP 01/13/15 1333 119/67 mmHg     Pulse Rate 01/13/15 1333 63     Resp 01/13/15 1333 17     Temp 01/13/15 1333 98 F (36.7 C)  Temp Source 01/13/15 1333 Oral     SpO2 01/13/15 1333 94 %     Weight 01/13/15 1333 182 lb (82.555 kg)     Height 01/13/15 1333 5\' 7"  (1.702 m)      Head Cir --      Peak Flow --      Pain Score 01/13/15 1336 1     Pain Loc --      Pain Edu? --      Excl. in GC? --    Constitutional: Alert and oriented. Well appearing and in no acute distress. Eyes: Conjunctivae are normal. PERRL. EOMI. Head: Atraumatic. Nose: No congestion/rhinnorhea. Mouth/Throat: Mucous membranes are moist.  Oropharynx non-erythematous. Neck: No stridor.   Cardiovascular: Normal rate, regular rhythm. Grossly normal heart sounds.  Good peripheral circulation. Respiratory: Normal respiratory effort.  No retractions. Lungs CTAB. Gastrointestinal: Soft and nontender. No distention. No abdominal bruits. No CVA tenderness. Genitourinary: The patient has a Foley catheter that is draining slightly red tinged urine. The catheter does not show any blockages, the Foley bulb is decompressed supple and can be refilled without pain or tenderness. He is draining urine appropriately, and he is not having any pain or discomfort. The penis is not correct and is normal-appearing. The testicles are nontender. There is no scrotal erythema. Musculoskeletal: No lower extremity tenderness nor edema.  No joint effusions. Neurologic:  Normal speech and language. No gross focal neurologic deficits are appreciated. No gait instability. Skin:  Skin is warm, dry and intact. No rash noted. Psychiatric: Mood and affect are normal. Speech and behavior are normal.  ____________________________________________   LABS (all labs ordered are listed, but only abnormal results are displayed)  Labs Reviewed  BASIC METABOLIC PANEL - Abnormal; Notable for the following:    Sodium 134 (*)    Glucose, Bld 118 (*)    Calcium 8.7 (*)    All other components within normal limits   ____________________________________________  EKG   ____________________________________________  RADIOLOGY   ____________________________________________   PROCEDURES  Procedure(s) performed: None  Critical Care  performed: No  ____________________________________________   INITIAL IMPRESSION / ASSESSMENT AND PLAN / ED COURSE  Pertinent labs & imaging results that were available during my care of the patient were reviewed by me and considered in my medical decision making (see chart for details).  Patient presents for evaluation of his Foley catheter which was causing pain while walking earlier. It appears to be functioning and draining well now. We'll check a creatinine, and continue to observe for good urinary output. Based on bedside assessment appears that is draining appropriately and I would anticipate that it would be slightly blood-tinged, no evidence of clot formation or heavy sediment. No infectious symptoms.  ----------------------------------------- 3:15 PM on 01/13/2015 -----------------------------------------  Patient reports that he is feeling an urge to urinate, performed a bedside ultrasound looking at the bladder and the bladder does appear to be moderately distended, I suspect his Foley catheter may actually be slightly blocked. We'll replace the Foley, and reevaluate. Ongoing care and disposition assigned to Dr. Langston Masker. Plan of care is to discharge the patient to [facility] successfully.  ----------------------------------------- 4:13 PM on 01/13/2015 -----------------------------------------  Patient's Foley flushed, he has excellent output now. Patient reports all symptoms resolved. Appears the patient likely had a slight obstruction of the Foley catheter, this is now completely relieved. ____________________________________________   FINAL CLINICAL IMPRESSION(S) / ED DIAGNOSES  Final diagnoses:  Foley catheter problem, initial  encounter      Sharyn Creamer, MD 01/13/15 [phone]

## 2015-01-13 NOTE — ED Notes (Signed)
Patient had foley placed on Wednesday after surgery to bladder for tumors. Today had pain in penis, describes as pressure

## 2015-01-14 ENCOUNTER — Emergency Department
Admission: EM | Admit: 2015-01-14 | Discharge: 2015-01-14 | Disposition: A | Discharge status: Home / Self Care | Payer: Medicare Other | Attending: Emergency Medicine | Admitting: Emergency Medicine

## 2015-01-14 ENCOUNTER — Encounter: Payer: Self-pay | Admitting: Emergency Medicine

## 2015-01-14 DIAGNOSIS — T83098A Other mechanical complication of other indwelling urethral catheter, initial encounter: Secondary | ICD-10-CM | POA: Diagnosis not present

## 2015-01-14 DIAGNOSIS — Y846 Urinary catheterization as the cause of abnormal reaction of the patient, or of later complication, without mention of misadventure at the time of the procedure: Secondary | ICD-10-CM | POA: Insufficient documentation

## 2015-01-14 DIAGNOSIS — Z7982 Long term (current) use of aspirin: Secondary | ICD-10-CM | POA: Insufficient documentation

## 2015-01-14 DIAGNOSIS — N390 Urinary tract infection, site not specified: Secondary | ICD-10-CM | POA: Insufficient documentation

## 2015-01-14 DIAGNOSIS — Z87891 Personal history of nicotine dependence: Secondary | ICD-10-CM | POA: Diagnosis not present

## 2015-01-14 DIAGNOSIS — Z7902 Long term (current) use of antithrombotics/antiplatelets: Secondary | ICD-10-CM | POA: Diagnosis not present

## 2015-01-14 DIAGNOSIS — R339 Retention of urine, unspecified: Secondary | ICD-10-CM

## 2015-01-14 DIAGNOSIS — Z792 Long term (current) use of antibiotics: Secondary | ICD-10-CM | POA: Diagnosis not present

## 2015-01-14 DIAGNOSIS — Z79899 Other long term (current) drug therapy: Secondary | ICD-10-CM | POA: Diagnosis not present

## 2015-01-14 DIAGNOSIS — R109 Unspecified abdominal pain: Secondary | ICD-10-CM | POA: Diagnosis present

## 2015-01-14 DIAGNOSIS — I10 Essential (primary) hypertension: Secondary | ICD-10-CM | POA: Insufficient documentation

## 2015-01-14 LAB — URINALYSIS COMPLETE WITH MICROSCOPIC (ARMC ONLY)
BILIRUBIN URINE: NEGATIVE
GLUCOSE, UA: NEGATIVE mg/dL
NITRITE: POSITIVE — AB
PROTEIN: 30 mg/dL — AB
SPECIFIC GRAVITY, URINE: 1.005 (ref 1.005–1.030)
pH: 5 (ref 5.0–8.0)

## 2015-01-14 LAB — COMPREHENSIVE METABOLIC PANEL
ALK PHOS: 69 U/L (ref 38–126)
ALT: 15 U/L — AB (ref 17–63)
AST: 31 U/L (ref 15–41)
Albumin: 4.3 g/dL (ref 3.5–5.0)
Anion gap: 11 (ref 5–15)
BILIRUBIN TOTAL: 1.4 mg/dL — AB (ref 0.3–1.2)
BUN: 18 mg/dL (ref 6–20)
CALCIUM: 9.7 mg/dL (ref 8.9–10.3)
CHLORIDE: 100 mmol/L — AB (ref 101–111)
CO2: 25 mmol/L (ref 22–32)
CREATININE: 1.23 mg/dL (ref 0.61–1.24)
GFR calc Af Amer: >60 mL/min (ref >60)
GFR, EST NON AFRICAN AMERICAN: 58 mL/min — AB (ref >60)
Glucose, Bld: 98 mg/dL (ref 65–99)
Potassium: 4.2 mmol/L (ref 3.5–5.1)
Sodium: 136 mmol/L (ref 135–145)
Total Protein: 7.5 g/dL (ref 6.5–8.1)

## 2015-01-14 LAB — CBC
HCT: 38.1 % — ABNORMAL LOW (ref 40.0–52.0)
HEMOGLOBIN: 12.7 g/dL — AB (ref 13.0–18.0)
MCH: 31.4 pg (ref 26.0–34.0)
MCHC: 33.3 g/dL (ref 32.0–36.0)
MCV: 94.4 fL (ref 80.0–100.0)
Platelets: 170 10*3/uL (ref 150–440)
RBC: 4.04 MIL/uL — ABNORMAL LOW (ref 4.40–5.90)
RDW: 14.3 % (ref 11.5–14.5)
WBC: 7.2 10*3/uL (ref 3.8–10.6)

## 2015-01-14 LAB — TROPONIN I: Troponin I: <0.03 ng/mL (ref <0.031)

## 2015-01-14 LAB — LIPASE, BLOOD: LIPASE: 22 U/L (ref 22–51)

## 2015-01-14 MED ORDER — DEXTROSE 5 % IV SOLN
1.0000 g | Freq: Once | INTRAVENOUS | Status: AC
  Administered 2015-01-14: 1 g via INTRAVENOUS
  Filled 2015-01-14: qty 10

## 2015-01-14 MED ORDER — ONDANSETRON HCL 4 MG/2ML IJ SOLN
4.0000 mg | Freq: Once | INTRAMUSCULAR | Status: AC
  Administered 2015-01-14: 4 mg via INTRAVENOUS
  Filled 2015-01-14: qty 2

## 2015-01-14 MED ORDER — LEVOFLOXACIN 500 MG PO TABS: 500.0000 mg | ORAL_TABLET | Freq: Every day | ORAL | Status: AC

## 2015-01-14 MED ORDER — MORPHINE SULFATE (PF) 4 MG/ML IV SOLN
4.0000 mg | Freq: Once | INTRAVENOUS | Status: AC
  Administered 2015-01-14: 4 mg via INTRAVENOUS
  Filled 2015-01-14: qty 1
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
## 2015-01-14 NOTE — ED Notes (Signed)
Patient was seen here yesterday bc thought foley was coming out.  Today c/o lower abdominal cramping and foley has not been draining urine since last night

## 2015-01-14 NOTE — Discharge Instructions (Signed)
Acute Urinary Retention Acute urinary retention is when you are unable to pee (urinate). Acute urinary retention is common in older men. Prostates can get bigger, which blocks the flow of pee.  HOME CARE  Drink enough fluids to keep your pee clear or pale yellow.  If you are sent home with a tube that drains the bladder (catheter), there will be a drainage bag attached to it. There are two types of bags. One is big that you can wear at night without having to empty it. One is smaller and needs to be emptied more often.  Keep the drainage bag empty.  Keep the drainage bag lower than your catheter.  Only take medicine as told by your doctor. GET HELP IF:  You have a low-grade fever.  You have spasms or you are leaking pee when you have spasms. GET HELP RIGHT AWAY IF:   You have chills or a fever.  Your catheter stops draining pee.  Your catheter falls out.  You have increased bleeding that does not stop after you have rested and increased the amount of fluids you had been drinking. MAKE SURE YOU:   Understand these instructions.  Will watch your condition.  Will get help right away if you are not doing well or get worse. Document Released: 10/08/2007 Document Revised: 02/09/2013 Document Reviewed: 09/30/2012 [facility] Patient Information 2015 [place], [place]. This information is not intended to replace advice given to you by your health care provider. Make sure you discuss any questions you have with your health care provider.  Urinary Tract Infection A urinary tract infection (UTI) can occur any place along the urinary tract. The tract includes the kidneys, ureters, bladder, and urethra. A type of germ called bacteria often causes a UTI. UTIs are often helped with antibiotic medicine.  HOME CARE   If given, take antibiotics as told by your doctor. Finish them even if you start to feel better.  Drink enough fluids to keep your pee (urine) clear or pale yellow.  Avoid tea,  drinks with caffeine, and bubbly (carbonated) drinks.  Pee often. Avoid holding your pee in for a long time.  Pee before and after having sex (intercourse).  Wipe from front to back after you poop (bowel movement) if you are a woman. Use each tissue only once. GET HELP RIGHT AWAY IF:   You have back pain.  You have lower belly (abdominal) pain.  You have chills.  You feel sick to your stomach (nauseous).  You throw up (vomit).  Your burning or discomfort with peeing does not go away.  You have a fever.  Your symptoms are not better in 3 days. MAKE SURE YOU:   Understand these instructions.  Will watch your condition.  Will get help right away if you are not doing well or get worse. Document Released: 10/08/2007 Document Revised: 01/14/2012 Document Reviewed: 11/20/2011 [facility] Patient Information 2015 [place], [place]. This information is not intended to replace advice given to you by your health care provider. Make sure you discuss any questions you have with your health care provider.

## 2015-01-16 LAB — URINE CULTURE: CULTURE: NO GROWTH

## 2015-06-15 ENCOUNTER — Inpatient Hospital Stay
Admission: EM | Admit: 2015-06-15 | Discharge: 2015-06-18 | DRG: 689 | Disposition: A | Discharge status: Home / Self Care | Payer: Non-veteran care | Attending: Internal Medicine | Admitting: Internal Medicine

## 2015-06-15 ENCOUNTER — Emergency Department: Payer: Non-veteran care

## 2015-06-15 ENCOUNTER — Encounter: Payer: Self-pay | Admitting: Emergency Medicine

## 2015-06-15 DIAGNOSIS — Z8249 Family history of ischemic heart disease and other diseases of the circulatory system: Secondary | ICD-10-CM | POA: Diagnosis not present

## 2015-06-15 DIAGNOSIS — G9341 Metabolic encephalopathy: Secondary | ICD-10-CM | POA: Diagnosis present

## 2015-06-15 DIAGNOSIS — R109 Unspecified abdominal pain: Secondary | ICD-10-CM | POA: Diagnosis present

## 2015-06-15 DIAGNOSIS — N170 Acute kidney failure with tubular necrosis: Secondary | ICD-10-CM | POA: Diagnosis present

## 2015-06-15 DIAGNOSIS — I5022 Chronic systolic (congestive) heart failure: Secondary | ICD-10-CM | POA: Diagnosis present

## 2015-06-15 DIAGNOSIS — I4891 Unspecified atrial fibrillation: Secondary | ICD-10-CM | POA: Diagnosis present

## 2015-06-15 DIAGNOSIS — Z87891 Personal history of nicotine dependence: Secondary | ICD-10-CM | POA: Diagnosis not present

## 2015-06-15 DIAGNOSIS — I110 Hypertensive heart disease with heart failure: Secondary | ICD-10-CM | POA: Diagnosis present

## 2015-06-15 DIAGNOSIS — E871 Hypo-osmolality and hyponatremia: Secondary | ICD-10-CM | POA: Diagnosis present

## 2015-06-15 DIAGNOSIS — Z8551 Personal history of malignant neoplasm of bladder: Secondary | ICD-10-CM

## 2015-06-15 DIAGNOSIS — I2510 Atherosclerotic heart disease of native coronary artery without angina pectoris: Secondary | ICD-10-CM | POA: Diagnosis present

## 2015-06-15 DIAGNOSIS — E785 Hyperlipidemia, unspecified: Secondary | ICD-10-CM | POA: Diagnosis present

## 2015-06-15 DIAGNOSIS — R441 Visual hallucinations: Secondary | ICD-10-CM | POA: Diagnosis present

## 2015-06-15 DIAGNOSIS — Z95810 Presence of automatic (implantable) cardiac defibrillator: Secondary | ICD-10-CM

## 2015-06-15 DIAGNOSIS — Z79899 Other long term (current) drug therapy: Secondary | ICD-10-CM | POA: Diagnosis not present

## 2015-06-15 DIAGNOSIS — R8281 Pyuria: Secondary | ICD-10-CM | POA: Diagnosis present

## 2015-06-15 DIAGNOSIS — Z951 Presence of aortocoronary bypass graft: Secondary | ICD-10-CM | POA: Diagnosis not present

## 2015-06-15 DIAGNOSIS — N390 Urinary tract infection, site not specified: Secondary | ICD-10-CM | POA: Diagnosis present

## 2015-06-15 DIAGNOSIS — I959 Hypotension, unspecified: Secondary | ICD-10-CM | POA: Diagnosis present

## 2015-06-15 DIAGNOSIS — Z7982 Long term (current) use of aspirin: Secondary | ICD-10-CM | POA: Diagnosis not present

## 2015-06-15 DIAGNOSIS — N179 Acute kidney failure, unspecified: Secondary | ICD-10-CM | POA: Diagnosis present

## 2015-06-15 DIAGNOSIS — E860 Dehydration: Secondary | ICD-10-CM | POA: Diagnosis present

## 2015-06-15 DIAGNOSIS — Z9861 Coronary angioplasty status: Secondary | ICD-10-CM

## 2015-06-15 DIAGNOSIS — I255 Ischemic cardiomyopathy: Secondary | ICD-10-CM | POA: Diagnosis present

## 2015-06-15 DIAGNOSIS — Z7902 Long term (current) use of antithrombotics/antiplatelets: Secondary | ICD-10-CM

## 2015-06-15 LAB — BASIC METABOLIC PANEL
ANION GAP: 8 (ref 5–15)
BUN: 42 mg/dL — ABNORMAL HIGH (ref 6–20)
CALCIUM: 9 mg/dL (ref 8.9–10.3)
CHLORIDE: 103 mmol/L (ref 101–111)
CO2: 21 mmol/L — AB (ref 22–32)
CREATININE: 2.69 mg/dL — AB (ref 0.61–1.24)
GFR calc non Af Amer: 23 mL/min — ABNORMAL LOW (ref >60)
GFR, EST AFRICAN AMERICAN: 26 mL/min — AB (ref >60)
Glucose, Bld: 93 mg/dL (ref 65–99)
Potassium: 5 mmol/L (ref 3.5–5.1)
Sodium: 132 mmol/L — ABNORMAL LOW (ref 135–145)

## 2015-06-15 LAB — CBC WITH DIFFERENTIAL/PLATELET
BASOS ABS: 0 10*3/uL (ref 0–0.1)
BASOS PCT: 1 %
EOS ABS: 0.1 10*3/uL (ref 0–0.7)
Eosinophils Relative: 2 %
HEMATOCRIT: 37 % — AB (ref 40.0–52.0)
HEMOGLOBIN: 12.2 g/dL — AB (ref 13.0–18.0)
Lymphocytes Relative: 14 %
Lymphs Abs: 0.6 10*3/uL — ABNORMAL LOW (ref 1.0–3.6)
MCH: 30.3 pg (ref 26.0–34.0)
MCHC: 33 g/dL (ref 32.0–36.0)
MCV: 91.9 fL (ref 80.0–100.0)
Monocytes Absolute: 0.5 10*3/uL (ref 0.2–1.0)
Monocytes Relative: 11 %
NEUTROS ABS: 3.2 10*3/uL (ref 1.4–6.5)
NEUTROS PCT: 72 %
Platelets: 168 10*3/uL (ref 150–440)
RBC: 4.03 MIL/uL — ABNORMAL LOW (ref 4.40–5.90)
RDW: 15.2 % — AB (ref 11.5–14.5)
WBC: 4.4 10*3/uL (ref 3.8–10.6)

## 2015-06-15 LAB — URINALYSIS COMPLETE WITH MICROSCOPIC (ARMC ONLY)
BILIRUBIN URINE: NEGATIVE
Bacteria, UA: NONE SEEN
GLUCOSE, UA: NEGATIVE mg/dL
NITRITE: NEGATIVE
Protein, ur: 100 mg/dL — AB
Specific Gravity, Urine: 1.013 (ref 1.005–1.030)
pH: 5 (ref 5.0–8.0)

## 2015-06-15 LAB — MAGNESIUM: MAGNESIUM: 2 mg/dL (ref 1.7–2.4)

## 2015-06-15 LAB — DIGOXIN LEVEL: Digoxin Level: <0.2 ng/mL — ABNORMAL LOW (ref 0.8–2.0)

## 2015-06-15 LAB — GLUCOSE, CAPILLARY: GLUCOSE-CAPILLARY: 80 mg/dL (ref 65–99)

## 2015-06-15 MED ORDER — PIPERACILLIN-TAZOBACTAM 3.375 G IVPB
3.3750 g | Freq: Three times a day (TID) | INTRAVENOUS | Status: DC
  Administered 2015-06-15 – 2015-06-17 (×5): 3.375 g via INTRAVENOUS
  Filled 2015-06-15 (×7): qty 50

## 2015-06-15 MED ORDER — NOREPINEPHRINE BITARTRATE 1 MG/ML IV SOLN
0.0000 ug/min | INTRAVENOUS | Status: DC
  Filled 2015-06-15: qty 4

## 2015-06-15 MED ORDER — SODIUM CHLORIDE 0.9 % IV BOLUS (SEPSIS)
1000.0000 mL | Freq: Once | INTRAVENOUS | Status: AC
Start: 2015-06-15 — End: 2015-06-15
  Administered 2015-06-15: 1000 mL via INTRAVENOUS

## 2015-06-15 MED ORDER — SODIUM CHLORIDE 0.9 % IV SOLN
INTRAVENOUS | Status: DC
  Administered 2015-06-15: 1 mL via INTRAVENOUS

## 2015-06-15 MED ORDER — HALOPERIDOL LACTATE 5 MG/ML IJ SOLN
1.0000 mg | Freq: Four times a day (QID) | INTRAMUSCULAR | Status: DC | PRN
  Administered 2015-06-16 (×2): 1 mg via INTRAVENOUS
  Filled 2015-06-15 (×2): qty 1

## 2015-06-15 NOTE — ED Notes (Signed)
Bladder scan performed 14ml

## 2015-06-15 NOTE — ED Notes (Signed)
Pt's BP noted to be low in both arms. Pt is not symptomatic and states that his BP normally runs around mid 90's over 60's.

## 2015-06-15 NOTE — ED Provider Notes (Signed)
Time Seen: Approximately *1700 I have reviewed the triage notes  Chief Complaint: Hallucinations   History of Present Illness: Andrew [name] is a 70 y.o. male who has a history of multiple medical problems. The patient has a history of bladder cancer and apparently is had a stent placed by the urology team at the [facility]. The patient and his family are somewhat vague historians and state that they had planned on taking the stent out but he had a "" yeast infection "". He was advised that he would need outpatient treatment and then they would reconsider because they found some new lesion on his left kidney. The patient's been having some visual hallucinations. The 2 new medications he was established on were Bactrim and fluconazole started on Wednesday. The hallucinations are strictly visual hallucinations and happen occasionally. They deny any fever at home. He has a history of ischemic cardiomyopathy and atrial fibrillation, etc. Outside of the Bactrim and fluconazole no new medications have been started. He denies any rashes or lesions. He has had difficulty with incomplete and frequent voiding with some mild pain with urination and is also had some urinary incontinence. His blood pressures normally run low for him. Bedside bladder scan only shows 14 mL of retained urine.   Past Medical History  Diagnosis Date  . Ischemic cardiomyopathy     a. EF 25-35%;   b. echo 3/11: EF 35%, mild LVH, severe inf/post HK, lat and apical HK, grade 1 diast dysfxn, mild AI, mild to mod MR, PASP 32  . CAD (coronary artery disease)     a. s/p CABG 1997;  b. s/p PCI to [facility] 2006;  c. s/p BMS x 3 to S-RCA 10/10;  d.  Myoview 2010: EF 24%, basal inf, mid inf. apical inf, basal inf-lat and mid IL scar, no isch;  e. cath 1/11: S-RCA occluded, S-OM chronically occluded, L-LAD ok with L-R collaterals (med. Tx)  . Ventricular tachycardia (HCC)     a. s/p AICD 2006;   b. Amio intolerant due to liver and thyroid  issues;   c. 2/12:  quinidine added to mexilitine  (consider Tikosyn in future??)  . Systolic CHF, chronic (HCC)   . Atrial fibrillation (HCC)   . HTN (hypertension)   . HLD (hyperlipidemia)     Patient Active Problem List   Diagnosis Date Noted  . Chronic systolic heart failure (HCC) 08/30/2010  . HYPOTENSION 07/23/2009  . TRANSAMINASES, SERUM, ELEVATED 07/23/2009  . HYPERCHOLESTEROLEMIA  IIA 06/21/2009  . CAD, ARTERY BYPASS GRAFT 06/21/2009  . CARDIOMYOPATHY, ISCHEMIC 06/21/2009  . AV BLOCK, 1ST DEGREE 06/21/2009  . 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 VENTRICULAR TACHYCARDIA 06/21/2009  . DYSPNEA ON EXERTION 06/21/2009  . ICD - IN SITU 06/21/2009    Past Surgical History  Procedure Laterality Date  . Coronary artery bypass graft  1997  . Cardiac catheterization  2006,02/2009  . Medtronic implantable cardioverter-dibrillator   2006    with sprint fidelis lead  . Bladder tumor excision      Past Surgical History  Procedure Laterality Date  . Coronary artery bypass graft  1997  . Cardiac catheterization  2006,02/2009  . Medtronic implantable cardioverter-dibrillator   2006    with sprint fidelis lead  . Bladder tumor excision      Current Outpatient Rx  Name  Route  Sig  Dispense  Refill  . acetaminophen (TYLENOL) 325 MG tablet   Oral   Take 650 mg  by mouth every 6 (six) hours as needed.           Marland Kitchen amoxicillin-clavulanate (AUGMENTIN) 875-125 MG per tablet   Oral   Take 1 tablet by mouth 2 (two) times daily.   20 tablet   0   . aspirin 81 MG tablet   Oral   Take 81 mg by mouth daily.           . carvedilol (COREG) 12.5 MG tablet   Oral   Take 12.5 mg by mouth 2 (two) times daily with a meal.           . digoxin (LANOXIN) 0.125 MG tablet   Oral   Take 125 mcg by mouth daily.           . fish oil-omega-3 fatty acids 1000 MG capsule   Oral   Take 2 g by mouth daily.           . furosemide (LASIX) 20 MG tablet   Oral    Take 1 tablet (20 mg total) by mouth daily.   90 tablet   3   . isosorbide mononitrate (IMDUR) 30 MG CR tablet   Oral   Take 30 mg by mouth as needed.           . mexiletine (MEXITIL) 250 MG capsule   Oral   Take 250 mg by mouth 2 (two) times daily.           . niacin 500 MG tablet   Oral   Take 500 mg by mouth daily with breakfast.           . nitroGLYCERIN (NITROSTAT) 0.4 MG SL tablet   Sublingual   Place 0.4 mg under the tongue every 5 (five) minutes as needed.           Marland Kitchen oxyCODONE-acetaminophen (PERCOCET) 7.5-325 MG per tablet   Oral   Take 1 tablet by mouth every 6 (six) hours as needed for severe pain.   12 tablet   0   . potassium chloride (KLOR-CON) 10 MEQ CR tablet   Oral   Take 1 tablet (10 mEq total) by mouth 2 (two) times daily.   180 tablet   3   . prasugrel (EFFIENT) 10 MG TABS   Oral   Take 10 mg by mouth daily.           . rosuvastatin (CRESTOR) 10 MG tablet   Oral   Take 10 mg by mouth daily. 1/2 tablet daily          . Tamsulosin HCl (FLOMAX) 0.4 MG CAPS   Oral   Take 0.4 mg by mouth daily.             Allergies:  Amiodarone  Family History: Family History  Problem Relation Age of Onset  . Heart failure Mother     congestive  . Heart attack Brother 61    hx of CABG    Social History: Social History  Substance Use Topics  . Smoking status: Former Smoker    Quit date: 05/05/2002  . Smokeless tobacco: None  . Alcohol Use: Yes     Comment: 3 beers weekly     Review of Systems:   10 point review of systems was performed and was otherwise negative:  Constitutional: No fever Eyes: No visual disturbances ENT: No sore throat, ear pain Cardiac: No chest pain Respiratory: No shortness of breath, wheezing, or stridor Abdomen: No abdominal pain, no vomiting, No  diarrhea Endocrine: No weight loss, No night sweats Extremities: No peripheral edema, cyanosis Skin: No rashes, easy bruising Neurologic: No focal weakness,  trouble with speech or swollowing Urologic: Urinary issues mentioned above no hematuria  Physical Exam:  ED Triage Vitals  Enc Vitals Group     BP 06/15/15 1447 90/45 mmHg     Pulse Rate 06/15/15 1447 74     Resp 06/15/15 1447 18     Temp 06/15/15 1447 97.5 F (36.4 C)     Temp Source 06/15/15 1447 Oral     SpO2 06/15/15 1447 99 %     Weight 06/15/15 1447 181 lb (82.101 kg)     Height 06/15/15 1447  (1.702 m)     Head Cir --      Peak Flow --      Pain Score 06/15/15 1448 7     Pain Loc --      Pain Edu? --      Excl. in GC? --     General: Awake , Alert , and Oriented times 3; GCS 15 somewhat rambling speech at times. Head: Normal cephalic , atraumatic Eyes: Pupils equal , round, reactive to light Nose/Throat: No nasal drainage, patent upper airway without erythema or exudate.  Neck: Supple, Full range of motion, No anterior adenopathy or palpable thyroid masses Lungs: Clear to ascultation without wheezes , rhonchi, or rales Heart: Regular rate, regular rhythm without murmurs , gallops , or rubs Abdomen: Soft, non tender without rebound, guarding , or rigidity; bowel sounds positive and symmetric in all 4 quadrants. No organomegaly .        Extremities: 2 plus symmetric pulses. No edema, clubbing or cyanosis Neurologic: normal ambulation, Motor symmetric without deficits, sensory intact Skin: warm, dry, no rashes   Labs:   All laboratory work was reviewed including any pertinent negatives or positives listed below:  Labs Reviewed  CBC WITH DIFFERENTIAL/PLATELET - Abnormal; Notable for the following:    RBC 4.03 (*)    Hemoglobin 12.2 (*)    HCT 37.0 (*)    RDW 15.2 (*)    Lymphs Abs 0.6 (*)    All other components within normal limits  BASIC METABOLIC PANEL - Abnormal; Notable for the following:    Sodium 132 (*)    CO2 21 (*)    BUN 42 (*)    Creatinine, Ser 2.69 (*)    GFR calc non Af Amer 23 (*)    GFR calc Af Amer 26 (*)    All other components within  normal limits  URINE CULTURE  GLUCOSE, CAPILLARY  URINALYSIS COMPLETEWITH MICROSCOPIC ([facility] ONLY)  DIGOXIN LEVEL   Review of the patient's laboratory work shows significant renal insufficiency. He also has apparent urinary tract infection. Urine culture is pending    Radiology:   EXAM: CT ABDOMEN AND PELVIS WITHOUT CONTRAST  TECHNIQUE: Multidetector CT imaging of the abdomen and pelvis was performed following the standard protocol without IV contrast.  COMPARISON: 10/25/2009 CT  FINDINGS: Please note that parenchymal abnormalities may be missed without intravenous contrast.  Lower chest: Cardiomegaly and ICD/pacemaker leads again noted.  Hepatobiliary: The liver and gallbladder are unremarkable. There is no evidence of biliary dilatation.  Pancreas: Unremarkable  Spleen: Unremarkable  Adrenals/Urinary Tract: A right urinary stent is present with tips in the right renal pelvis and bladder. No urinary calculi are identified. There is no evidence of hydronephrosis. There is mild bladder wall thickening, slightly greater along the posterior left aspect of  the bladder.  Stomach/Bowel: There is no evidence of bowel obstruction or a definite bowel abnormality. The appendix is normal.  Vascular/Lymphatic: Aortic atherosclerotic calcifications noted without aneurysm. No enlarged lymph nodes are identified.  Reproductive: Prostate grossly unremarkable.  Other: No free fluid, focal collection or pneumoperitoneum.  Musculoskeletal: No acute or suspicious abnormalities. Degenerative changes within the lumbar spine identified, moderate -severe at L5-S1.  IMPRESSION: Right urinary stent as described. No evidence of urinary calculi or hydronephrosis.  Mild bladder wall thickening, eccentric along the posterior left aspect. Consider direct inspection or further evaluation.  Aortic atherosclerosis without aneurysm.  Lumbar spine degenerative changes.     I  personally reviewed the radiologic studies   P  ED Course: * Patient's stay here was uneventful and as far as his altered mental status may be related to significant dehydration with the bump in his creatinine compared to previous levels. Due to his history of cardiomyopathy he was started on 500 mL boluses of fluid and then reassessed patient denies any current shortness of breath and overall is been very cooperative. He appears to be of understanding and only periodically has these visual hallucinations and review of fluid, is on Bactrim did not show an obvious hallucinogenic effect. Patient urine culture is pending at this point. [facility] states that he wishes to stay here at [facility] as opposed to transfer    Assessment: * Altered mental status Dehydration Renal insufficiency Urinary tract infection (fungal versus bacterial)   F   Plan:  Inpatient management            Jennye Moccasin, MD 06/15/15 [phone]

## 2015-06-15 NOTE — ED Notes (Signed)
Called to give report, nurse stated they are dealing with a medical emergency, will return call to get report when ready.

## 2015-06-15 NOTE — H&P (Addendum)
[facility] [place] at [facility]   PATIENT NAME: Andrew [name]    MR#:  [id number]  DATE OF BIRTH:  1945/11/07  DATE OF ADMISSION:  06/15/2015  PRIMARY CARE PHYSICIAN: Dellia Nims, MD   REQUESTING/REFERRING PHYSICIAN:   CHIEF COMPLAINT:   Chief Complaint  Patient presents with  . Hallucinations    HISTORY OF PRESENT ILLNESS: Andrew [name]  is a 70 y.o. male with a known history of ischemic cardiomyopathy with ejection fraction of 25-35% by echo in 2011, history of coronary artery disease status post coronary artery bypass graft, V. Tach, chronic systolic CHF, hypertension, hyperlipidemia who presents to the hospital with complaints of visual hallucinations. Patient underwent right ureteral stent placement for ureteral narrowing at the [facility] recently.  According to the patient's wife, patient started seeing things which were not there yesterday.  She, however, noted that he is not himself on Wednesday, which was 2 days ago. Last night he was found in the bathroom on the floor, complaining that he was trapped in the bathroom and could not figure out how to get out from it. He also complained of dysuria and suprapubic abdominal pain as well as right back pain, admitted of fatigue and weakness increased frequency of urination as well as incontinence. On arrival to emergency room, patient was found to be hypotensive and in acute renal failure. He underwent CT scan of the abdomen and pelvis which revealed ureteral stent on the right, bladder wall thickening, but no hydronephrosis. Hospitalist services were contacted for admission as patient remains severely hypotensive despite IV fluid administration.   PAST MEDICAL HISTORY:   Past Medical History  Diagnosis Date  . Ischemic cardiomyopathy     a. EF 25-35%;   b. echo 3/11: EF 35%, mild LVH, severe inf/post HK, lat and apical HK, grade 1 diast dysfxn, mild AI, mild to mod MR, PASP 32  . CAD (coronary artery  disease)     a. s/p CABG 1997;  b. s/p PCI to [facility] 2006;  c. s/p BMS x 3 to S-RCA 10/10;  d.  Myoview 2010: EF 24%, basal inf, mid inf. apical inf, basal inf-lat and mid IL scar, no isch;  e. cath 1/11: S-RCA occluded, S-OM chronically occluded, L-LAD ok with L-R collaterals (med. Tx)  . Ventricular tachycardia (HCC)     a. s/p AICD 2006;   b. Amio intolerant due to liver and thyroid issues;   c. 2/12:  quinidine added to mexilitine  (consider Tikosyn in future??)  . Systolic CHF, chronic (HCC)   . Atrial fibrillation (HCC)   . HTN (hypertension)   . HLD (hyperlipidemia)     PAST SURGICAL HISTORY: Past Surgical History  Procedure Laterality Date  . Coronary artery bypass graft  1997  . Cardiac catheterization  2006,02/2009  . Medtronic implantable cardioverter-dibrillator   2006    with sprint fidelis lead  . Bladder tumor excision      SOCIAL HISTORY:  Social History  Substance Use Topics  . Smoking status: Former Smoker    Quit date: 05/05/2002  . Smokeless tobacco: Not on file  . Alcohol Use: Yes     Comment: 3 beers weekly    FAMILY HISTORY:  Family History  Problem Relation Age of Onset  . Heart failure Mother     congestive  . Heart attack Brother 59    hx of CABG    DRUG ALLERGIES:  Allergies  Allergen Reactions  . Amiodarone Other (See  Comments)    Reaction:  Unknown     Review of Systems  Constitutional: Positive for malaise/fatigue. Negative for fever, chills and weight loss.  HENT: Negative for congestion.   Eyes: Negative for blurred vision and double vision.  Respiratory: Negative for cough, sputum production, shortness of breath and wheezing.   Cardiovascular: Negative for chest pain, palpitations, orthopnea, leg swelling and PND.  Gastrointestinal: Positive for abdominal pain. Negative for nausea, vomiting, diarrhea, constipation, blood in stool and melena.  Genitourinary: Positive for dysuria, urgency and frequency. Negative for hematuria.   Musculoskeletal: Positive for back pain and falls.  Skin: Negative for rash.  Neurological: Negative for dizziness and weakness.  Psychiatric/Behavioral: Negative for depression and memory loss. The patient is not nervous/anxious.     MEDICATIONS AT HOME:  Prior to Admission medications   Medication Sig Start Date End Date Taking? Authorizing Provider  acetaminophen (TYLENOL) 325 MG tablet Take 975 mg by mouth 3 (three) times daily.    Yes Historical Provider, MD  aspirin EC 81 MG tablet Take 81 mg by mouth at bedtime.   Yes Historical Provider, MD  carvedilol (COREG) 12.5 MG tablet Take 12.5 mg by mouth 2 (two) times daily with a meal.     Yes Historical Provider, MD  citalopram (CELEXA) 20 MG tablet Take 20 mg by mouth at bedtime.   Yes Historical Provider, MD  clopidogrel (PLAVIX) 75 MG tablet Take 75 mg by mouth at bedtime.   Yes Historical Provider, MD  finasteride (PROSCAR) 5 MG tablet Take 5 mg by mouth daily.   Yes Historical Provider, MD  fluconazole (DIFLUCAN) 200 MG tablet Take 200 mg by mouth daily.   Yes Historical Provider, MD  furosemide (LASIX) 20 MG tablet Take 20 mg by mouth at bedtime.   Yes Historical Provider, MD  gabapentin (NEURONTIN) 400 MG capsule Take 1,200 mg by mouth 3 (three) times daily.   Yes Historical Provider, MD  hydrocerin (EUCERIN) CREA Apply 1 application topically 2 (two) times daily as needed (for irritation).   Yes Historical Provider, MD  hydrocortisone 2.5 % ointment Apply 1 application topically 2 (two) times daily as needed (for itching).   Yes Historical Provider, MD  isosorbide mononitrate (IMDUR) 30 MG 24 hr tablet Take 30 mg by mouth daily as needed (for increased BP).   Yes Historical Provider, MD  lisinopril (PRINIVIL,ZESTRIL) 10 MG tablet Take 10 mg by mouth daily.   Yes Historical Provider, MD  Menthol-Methyl Salicylate (THERA-GESIC EX) Apply 1 application topically 3 (three) times daily as needed (for pain).   Yes Historical Provider, MD   methocarbamol (ROBAXIN) 750 MG tablet Take 750 mg by mouth 2 (two) times daily as needed for muscle spasms.   Yes Historical Provider, MD  metoprolol succinate (TOPROL-XL) 25 MG 24 hr tablet Take 25 mg by mouth daily.   Yes Historical Provider, MD  mexiletine (MEXITIL) 250 MG capsule Take 250 mg by mouth 2 (two) times daily.     Yes Historical Provider, MD  niacin 500 MG tablet Take 500 mg by mouth at bedtime.   Yes Historical Provider, MD  nitroGLYCERIN (NITROSTAT) 0.4 MG SL tablet Place 0.4 mg under the tongue every 5 (five) minutes as needed for chest pain.    Yes Historical Provider, MD  Omega-3 Fatty Acids (FISH OIL) 1000 MG CAPS Take 1,000 mg by mouth at bedtime.   Yes Historical Provider, MD  omeprazole (PRILOSEC) 20 MG capsule Take 40 mg by mouth 2 (two) times daily before  a meal.   Yes Historical Provider, MD  quiniDINE gluconate 324 MG CR tablet Take 324 mg by mouth 2 (two) times daily.   Yes Historical Provider, MD  simvastatin (ZOCOR) 40 MG tablet Take 40 mg by mouth at bedtime.   Yes Historical Provider, MD  sulfamethoxazole-trimethoprim (BACTRIM DS,SEPTRA DS) 800-160 MG tablet Take 1 tablet by mouth 2 (two) times daily. 06/11/15 06/18/15 Yes Historical Provider, MD  Tamsulosin HCl (FLOMAX) 0.4 MG CAPS Take 0.4 mg by mouth 2 (two) times daily.    Yes Historical Provider, MD      PHYSICAL EXAMINATION:   VITAL SIGNS: Blood pressure 88/64, pulse 64, temperature 97.5 F (36.4 C), temperature source Oral, resp. rate 18, height 5\' 7"  (1.702 m), weight 82.101 kg (181 lb), SpO2 95 %.  GENERAL:  70 y.o.-year-old patient lying in the bed with no acute distress.  EYES: Pupils equal, round, reactive to light and accommodation. No scleral icterus. Extraocular muscles intact.  HEENT: Head atraumatic, normocephalic. Oropharynx and nasopharynx clear.  NECK:  Supple, no jugular venous distention. No thyroid enlargement, no tenderness.  LUNGS: Normal breath sounds bilaterally, no wheezing,  rales,rhonchi or crepitation. No use of accessory muscles of respiration.  CARDIOVASCULAR: S1, S2 normal. No murmurs, rubs, or gallops.  ABDOMEN: Soft, nontender, nondistended. Bowel sounds present. No organomegaly or mass.  EXTREMITIES: No pedal edema, cyanosis, or clubbing.  NEUROLOGIC: Cranial nerves II through XII are intact. Muscle strength 5/5 in all extremities. Sensation intact. Gait not checked.  PSYCHIATRIC: The patient is alert and oriented x 3.  SKIN: No obvious rash, lesion, or ulcer.   LABORATORY PANEL:   CBC  Recent Labs Lab 06/15/15 1451  WBC 4.4  HGB 12.2*  HCT 37.0*  PLT 168  MCV 91.9  MCH 30.3  MCHC 33.0  RDW 15.2*  LYMPHSABS 0.6*  MONOABS 0.5  EOSABS 0.1  BASOSABS 0.0   ------------------------------------------------------------------------------------------------------------------  Chemistries   Recent Labs Lab 06/15/15 1451  NA 132*  K 5.0  CL 103  CO2 21*  GLUCOSE 93  BUN 42*  CREATININE 2.69*  CALCIUM 9.0   ------------------------------------------------------------------------------------------------------------------  Cardiac Enzymes No results for input(s): TROPONINI in the last 168 hours. ------------------------------------------------------------------------------------------------------------------  RADIOLOGY: Ct Renal Stone Study  06/15/2015  CLINICAL DATA:  70 year old male with acute left flank pain. History of renal stent and bladder cancer. EXAM: CT ABDOMEN AND PELVIS WITHOUT CONTRAST TECHNIQUE: Multidetector CT imaging of the abdomen and pelvis was performed following the standard protocol without IV contrast. COMPARISON:  10/25/2009 CT FINDINGS: Please note that parenchymal abnormalities may be missed without intravenous contrast. Lower chest:  Cardiomegaly and ICD/pacemaker leads again noted. Hepatobiliary: The liver and gallbladder are unremarkable. There is no evidence of biliary dilatation. Pancreas: Unremarkable Spleen:  Unremarkable Adrenals/Urinary Tract: A right urinary stent is present with tips in the right renal pelvis and bladder. No urinary calculi are identified. There is no evidence of hydronephrosis. There is mild bladder wall thickening, slightly greater along the posterior left aspect of the bladder. Stomach/Bowel: There is no evidence of bowel obstruction or a definite bowel abnormality. The appendix is normal. Vascular/Lymphatic: Aortic atherosclerotic calcifications noted without aneurysm. No enlarged lymph nodes are identified. Reproductive: Prostate grossly unremarkable. Other: No free fluid, focal collection or pneumoperitoneum. Musculoskeletal: No acute or suspicious abnormalities. Degenerative changes within the lumbar spine identified, moderate -severe at L5-S1. IMPRESSION: Right urinary stent as described. No evidence of urinary calculi or hydronephrosis. Mild bladder wall thickening, eccentric along the posterior left aspect. Consider direct inspection  or further evaluation. Aortic atherosclerosis without aneurysm. Lumbar spine degenerative changes. Electronically Signed   By: Harmon Pier M.D.   On: 06/15/2015 17:25    EKG: Orders placed or performed during the hospital encounter of 01/14/15  . EKG 12-Lead  . EKG 12-Lead  . EKG    IMPRESSION AND PLAN:  Active Problems:   Acute renal failure (ARF) (HCC)   Hyponatremia   Pyuria   Visual hallucinations  #1. Sepsis due to urinary tract infection, get blood cultures as well as urinary cultures and initiate patient on Zosyn, adjusted per renal failure #2. Urinary tract infection, questionable acute pyelonephritis. Continue antibiotic therapy following culture results #3. Acute renal failure due to acute tubular necrosis. Continue patient on IV fluids, holding Lasix and ACE inhibitor, recheck kidney function in the morning, bladder scan was low #4. Hyponatremia, follow with rehydration. #5, visual hallucinations, altered mental status, likely  delirium due to sepsis infection. Follow with therapy, supportive care. Haldol as needed #6. Hypotension, questionable septic shock, initiate patient on IV fluids, holding all blood pressure medications. Pressors as needed, keeping map of around 65 and  above   All the records are reviewed and case discussed with ED provider. Management plans discussed with the patient, family and they are in agreement.  CODE STATUS: Code Status History    This patient does not have a recorded code status. Please follow your organizational policy for patients in this situation.    Advance Directive Documentation        Most Recent Value   Type of Advance Directive  Living will   Pre-existing out of facility DNR order (yellow form or pink MOST form)     "MOST" Form in Place?         TOTAL CRITICAL CARE TIME TAKING CARE OF THIS PATIENT: 60 minutes.    Katharina Caper M.D on 06/15/2015 at 7:29 PM  Between 7am to 6pm - Pager - [phone] After 6pm go to [email or web address] - password [email or web address] [facility]  [facility]  Office  [phone]  CC: Primary care physician; Dellia Nims, MD

## 2015-06-15 NOTE — ED Notes (Signed)
Pt states that he is seeing and talking to people that aren't there. Pt states he started Bactrim and Fluconazole on Monday for a yeast infection and the hallucinations started on Wednesday. Family states he will be talking and having a conversation and will be sitting in a room by himself. Pt states he got trapped in the bathroom last night due to heavy boxes blocking the doorway but family states nothing was in the bathroom.

## 2015-06-15 NOTE — Consult Note (Signed)
Pharmacy Antibiotic Note  Andrew [name] is a 70 y.o. male admitted on 06/15/2015 with suspected UTI/sepsis.  Pharmacy has been consulted for zosyn dosing.  UA suspicious for infection, UCx sent.  Plan: Zosyn 3.375g IV q8h (4 hour infusion).  CrCl is 26.6 ml/min, will continue to monitor for changes in renal function that require an extended interval.  Height:  (170.2 cm) Weight: 181 lb (82.101 kg) IBW/kg (Calculated) : 66.1  Temp (24hrs), Avg:97.5 F (36.4 C), Min:97.5 F (36.4 C), Max:97.5 F (36.4 C)   Recent Labs Lab 06/15/15 1451  WBC 4.4  CREATININE 2.69*    Estimated Creatinine Clearance: 26.6 mL/min (by C-G formula based on Cr of 2.69).    Allergies  Allergen Reactions  . Amiodarone Other (See Comments)    Reaction:  Unknown     Anti-infectives    Start     Dose/Rate Route Frequency Ordered Stop   06/15/15 2200  piperacillin-tazobactam (ZOSYN) IVPB 3.375 g     3.375 g 12.5 mL/hr over 240 Minutes Intravenous 3 times per day 06/15/15 1941        Results for orders placed or performed during the hospital encounter of 01/14/15  Urine culture     Status: None   Collection Time: 01/14/15  1:38 PM  Result Value Ref Range Status   Specimen Description URINE, CATHETERIZED  Final   Special Requests NONE  Final   Culture NO GROWTH 1 DAY  Final   Report Status 01/16/2015 FINAL  Final    Thank you for allowing pharmacy to be a part of this patient's care.  Cy Blamer 06/15/2015 7:34 PM

## 2015-06-16 LAB — BASIC METABOLIC PANEL
Anion gap: 9 (ref 5–15)
BUN: 33 mg/dL — AB (ref 6–20)
CALCIUM: 8.6 mg/dL — AB (ref 8.9–10.3)
CO2: 18 mmol/L — ABNORMAL LOW (ref 22–32)
CREATININE: 1.88 mg/dL — AB (ref 0.61–1.24)
Chloride: 109 mmol/L (ref 101–111)
GFR calc Af Amer: 40 mL/min — ABNORMAL LOW (ref >60)
GFR, EST NON AFRICAN AMERICAN: 35 mL/min — AB (ref >60)
Glucose, Bld: 93 mg/dL (ref 65–99)
Potassium: 4.7 mmol/L (ref 3.5–5.1)
SODIUM: 136 mmol/L (ref 135–145)

## 2015-06-16 LAB — CBC
HCT: 34.8 % — ABNORMAL LOW (ref 40.0–52.0)
Hemoglobin: 11.7 g/dL — ABNORMAL LOW (ref 13.0–18.0)
MCH: 30.7 pg (ref 26.0–34.0)
MCHC: 33.5 g/dL (ref 32.0–36.0)
MCV: 91.6 fL (ref 80.0–100.0)
Platelets: 153 10*3/uL (ref 150–440)
RBC: 3.8 MIL/uL — ABNORMAL LOW (ref 4.40–5.90)
RDW: 15 % — AB (ref 11.5–14.5)
WBC: 5.1 10*3/uL (ref 3.8–10.6)

## 2015-06-16 MED ORDER — QUINIDINE GLUCONATE ER 324 MG PO TBCR
324.0000 mg | EXTENDED_RELEASE_TABLET | Freq: Two times a day (BID) | ORAL | Status: DC
  Administered 2015-06-16 – 2015-06-18 (×5): 324 mg via ORAL
  Filled 2015-06-16 (×7): qty 1

## 2015-06-16 MED ORDER — HYDROCORTISONE 2.5 % EX OINT: 1.0000 "application " | TOPICAL_OINTMENT | Freq: Two times a day (BID) | CUTANEOUS | Status: DC | PRN

## 2015-06-16 MED ORDER — FLUCONAZOLE 100 MG PO TABS
200.0000 mg | ORAL_TABLET | Freq: Every day | ORAL | Status: DC
  Administered 2015-06-16 – 2015-06-18 (×3): 200 mg via ORAL
  Filled 2015-06-16 (×3): qty 2

## 2015-06-16 MED ORDER — ASPIRIN EC 81 MG PO TBEC
81.0000 mg | DELAYED_RELEASE_TABLET | Freq: Every day | ORAL | Status: DC
  Administered 2015-06-16 – 2015-06-17 (×3): 81 mg via ORAL
  Filled 2015-06-16 (×3): qty 1

## 2015-06-16 MED ORDER — SIMVASTATIN 40 MG PO TABS
40.0000 mg | ORAL_TABLET | Freq: Every day | ORAL | Status: DC
  Administered 2015-06-16 – 2015-06-17 (×3): 40 mg via ORAL
  Filled 2015-06-16 (×4): qty 1

## 2015-06-16 MED ORDER — ACETAMINOPHEN 650 MG RE SUPP: 650.0000 mg | Freq: Four times a day (QID) | RECTAL | Status: DC | PRN

## 2015-06-16 MED ORDER — FINASTERIDE 5 MG PO TABS
5.0000 mg | ORAL_TABLET | Freq: Every day | ORAL | Status: DC
  Administered 2015-06-16 – 2015-06-18 (×3): 5 mg via ORAL
  Filled 2015-06-16 (×3): qty 1

## 2015-06-16 MED ORDER — ONDANSETRON HCL 4 MG PO TABS: 4.0000 mg | ORAL_TABLET | Freq: Four times a day (QID) | ORAL | Status: DC | PRN

## 2015-06-16 MED ORDER — THERA-GESIC 0.5-15 % EX CREA: TOPICAL_CREAM | Freq: Three times a day (TID) | CUTANEOUS | Status: DC | PRN

## 2015-06-16 MED ORDER — PANTOPRAZOLE SODIUM 40 MG PO TBEC
40.0000 mg | DELAYED_RELEASE_TABLET | Freq: Every day | ORAL | Status: DC
  Administered 2015-06-16 – 2015-06-18 (×3): 40 mg via ORAL
  Filled 2015-06-16 (×3): qty 1

## 2015-06-16 MED ORDER — ONDANSETRON HCL 4 MG/2ML IJ SOLN: 4.0000 mg | Freq: Four times a day (QID) | INTRAMUSCULAR | Status: DC | PRN

## 2015-06-16 MED ORDER — CITALOPRAM HYDROBROMIDE 20 MG PO TABS
20.0000 mg | ORAL_TABLET | Freq: Every day | ORAL | Status: DC
  Administered 2015-06-16 – 2015-06-17 (×3): 20 mg via ORAL
  Filled 2015-06-16 (×3): qty 1

## 2015-06-16 MED ORDER — SODIUM CHLORIDE 0.9% FLUSH
3.0000 mL | Freq: Two times a day (BID) | INTRAVENOUS | Status: DC
  Administered 2015-06-16 – 2015-06-17 (×5): 3 mL via INTRAVENOUS

## 2015-06-16 MED ORDER — SODIUM CHLORIDE 0.9 % IV SOLN
INTRAVENOUS | Status: DC
  Administered 2015-06-16 – 2015-06-17 (×5): via INTRAVENOUS

## 2015-06-16 MED ORDER — HYDROCORTISONE 1 % EX CREA: TOPICAL_CREAM | Freq: Two times a day (BID) | CUTANEOUS | Status: DC | PRN

## 2015-06-16 MED ORDER — OMEGA-3-ACID ETHYL ESTERS 1 G PO CAPS
1.0000 g | ORAL_CAPSULE | Freq: Every day | ORAL | Status: DC
Start: 2015-06-16 — End: 2015-06-18
  Administered 2015-06-16 – 2015-06-18 (×3): 1 g via ORAL
  Filled 2015-06-16 (×3): qty 1

## 2015-06-16 MED ORDER — MEXILETINE HCL 250 MG PO CAPS
250.0000 mg | ORAL_CAPSULE | Freq: Two times a day (BID) | ORAL | Status: DC
  Administered 2015-06-16 – 2015-06-18 (×4): 250 mg via ORAL
  Filled 2015-06-16 (×8): qty 1

## 2015-06-16 MED ORDER — ACETAMINOPHEN 325 MG PO TABS
975.0000 mg | ORAL_TABLET | Freq: Three times a day (TID) | ORAL | Status: DC
  Administered 2015-06-16 – 2015-06-18 (×7): 975 mg via ORAL
  Filled 2015-06-16 (×7): qty 3

## 2015-06-16 MED ORDER — CLOPIDOGREL BISULFATE 75 MG PO TABS
75.0000 mg | ORAL_TABLET | Freq: Every day | ORAL | Status: DC
  Administered 2015-06-16 – 2015-06-17 (×3): 75 mg via ORAL
  Filled 2015-06-16 (×3): qty 1

## 2015-06-16 MED ORDER — METHOCARBAMOL 750 MG PO TABS
750.0000 mg | ORAL_TABLET | Freq: Two times a day (BID) | ORAL | Status: DC | PRN
  Filled 2015-06-16: qty 1

## 2015-06-16 MED ORDER — TAMSULOSIN HCL 0.4 MG PO CAPS
0.4000 mg | ORAL_CAPSULE | Freq: Two times a day (BID) | ORAL | Status: DC
  Administered 2015-06-16 – 2015-06-18 (×6): 0.4 mg via ORAL
  Filled 2015-06-16 (×6): qty 1

## 2015-06-16 MED ORDER — GABAPENTIN 400 MG PO CAPS
1200.0000 mg | ORAL_CAPSULE | Freq: Three times a day (TID) | ORAL | Status: DC
  Administered 2015-06-16 – 2015-06-18 (×7): 1200 mg via ORAL
  Filled 2015-06-16 (×8): qty 3

## 2015-06-16 MED ORDER — NIACIN 500 MG PO TABS
500.0000 mg | ORAL_TABLET | Freq: Every day | ORAL | Status: DC
  Administered 2015-06-16 – 2015-06-17 (×3): 500 mg via ORAL
  Filled 2015-06-16 (×4): qty 1

## 2015-06-16 MED ORDER — ISOSORBIDE MONONITRATE ER 30 MG PO TB24: 30.0000 mg | ORAL_TABLET | Freq: Every day | ORAL | Status: DC | PRN

## 2015-06-16 MED ORDER — NITROGLYCERIN 0.4 MG SL SUBL: 0.4000 mg | SUBLINGUAL_TABLET | SUBLINGUAL | Status: DC | PRN

## 2015-06-16 MED ORDER — TROLAMINE SALICYLATE 10 % EX CREA: TOPICAL_CREAM | Freq: Three times a day (TID) | CUTANEOUS | Status: DC | PRN

## 2015-06-16 MED ORDER — ACETAMINOPHEN 325 MG PO TABS: 650.0000 mg | ORAL_TABLET | Freq: Four times a day (QID) | ORAL | Status: DC | PRN

## 2015-06-16 MED ORDER — HEPARIN SODIUM (PORCINE) 5000 UNIT/ML IJ SOLN
5000.0000 [IU] | Freq: Three times a day (TID) | INTRAMUSCULAR | Status: DC
  Administered 2015-06-16 – 2015-06-18 (×8): 5000 [IU] via SUBCUTANEOUS
  Filled 2015-06-16 (×8): qty 1

## 2015-06-16 NOTE — Progress Notes (Signed)
[facility] [place] [facility] - [place] at [facility]   PATIENT NAME: Andrew [name]    MR#:  [id number]  DATE OF BIRTH:  1945-05-15  SUBJECTIVE:  Patient remains markedly confused and unable to provide meaningful information He was speaking to me about "how his brother sells painted doors" as he proceeded to hold an imaginary painted door REVIEW OF SYSTEMS:  Unable to accurately obtain given patient's mental status  DRUG ALLERGIES:   Allergies  Allergen Reactions  . Amiodarone Other (See Comments)    Reaction:  Unknown     VITALS:  Blood pressure 111/61, pulse 78, temperature 98.2 F (36.8 C), temperature source Oral, resp. rate 21, height 5' 7.5" (1.715 m), weight 78.971 kg (174 lb 1.6 oz), SpO2 97 %.  PHYSICAL EXAMINATION:  VITAL SIGNS: Filed Vitals:   06/16/15 0442 06/16/15 1330  BP: 98/50 111/61  Pulse: 68 78  Temp: 97.7 F (36.5 C) 98.2 F (36.8 C)  Resp: 20 21   GENERAL:69 y.o.male currently in moderate acute distress. Given mental status HEAD: Normocephalic, atraumatic.  EYES: Pupils equal, round, reactive to light. Extraocular muscles intact. No scleral icterus.  MOUTH: Moist mucosal membrane. Dentition intact. No abscess noted.  EAR, NOSE, THROAT: Clear without exudates. No external lesions.  NECK: Supple. No thyromegaly. No nodules. No JVD.  PULMONARY: Clear to ascultation, without wheeze rails or rhonci. No use of accessory muscles, Good respiratory effort. good air entry bilaterally CHEST: Nontender to palpation.  CARDIOVASCULAR: S1 and S2. Regular rate and rhythm. No murmurs, rubs, or gallops. No edema. Pedal pulses 2+ bilaterally.  GASTROINTESTINAL: Soft, nontender, nondistended. No masses. Positive bowel sounds. No hepatosplenomegaly.  MUSCULOSKELETAL: No swelling, clubbing, or edema. Range of motion full in all extremities.  NEUROLOGIC: Cranial nerves II through XII are intact. No gross focal neurological deficits. Sensation intact. Reflexes  intact.  SKIN: No ulceration, lesions, rashes, or cyanosis. Skin warm and dry. Turgor intact.  PSYCHIATRIC: Mood, affect within normal limits. The patient is awake, alert and oriented x 3. Insight, judgment intact.      LABORATORY PANEL:   CBC  Recent Labs Lab 06/16/15 0448  WBC 5.1  HGB 11.7*  HCT 34.8*  PLT 153   ------------------------------------------------------------------------------------------------------------------  Chemistries   Recent Labs Lab 06/15/15 1451 06/16/15 0448  NA 132* 136  K 5.0 4.7  CL 103 109  CO2 21* 18*  GLUCOSE 93 93  BUN 42* 33*  CREATININE 2.69* 1.88*  CALCIUM 9.0 8.6*  MG 2.0  --    ------------------------------------------------------------------------------------------------------------------  Cardiac Enzymes No results for input(s): TROPONINI in the last 168 hours. ------------------------------------------------------------------------------------------------------------------  RADIOLOGY:  Ct Renal Stone Study  06/15/2015  CLINICAL DATA:  70 year old male with acute left flank pain. History of renal stent and bladder cancer. EXAM: CT ABDOMEN AND PELVIS WITHOUT CONTRAST TECHNIQUE: Multidetector CT imaging of the abdomen and pelvis was performed following the standard protocol without IV contrast. COMPARISON:  10/25/2009 CT FINDINGS: Please note that parenchymal abnormalities may be missed without intravenous contrast. Lower chest:  Cardiomegaly and ICD/pacemaker leads again noted. Hepatobiliary: The liver and gallbladder are unremarkable. There is no evidence of biliary dilatation. Pancreas: Unremarkable Spleen: Unremarkable Adrenals/Urinary Tract: A right urinary stent is present with tips in the right renal pelvis and bladder. No urinary calculi are identified. There is no evidence of hydronephrosis. There is mild bladder wall thickening, slightly greater along the posterior left aspect of the bladder. Stomach/Bowel: There is no  evidence of bowel obstruction or a definite bowel abnormality. The  appendix is normal. Vascular/Lymphatic: Aortic atherosclerotic calcifications noted without aneurysm. No enlarged lymph nodes are identified. Reproductive: Prostate grossly unremarkable. Other: No free fluid, focal collection or pneumoperitoneum. Musculoskeletal: No acute or suspicious abnormalities. Degenerative changes within the lumbar spine identified, moderate -severe at L5-S1. IMPRESSION: Right urinary stent as described. No evidence of urinary calculi or hydronephrosis. Mild bladder wall thickening, eccentric along the posterior left aspect. Consider direct inspection or further evaluation. Aortic atherosclerosis without aneurysm. Lumbar spine degenerative changes. Electronically Signed   By: Harmon Pier M.D.   On: 06/15/2015 17:25    EKG:   Orders placed or performed during the hospital encounter of 01/14/15  . EKG 12-Lead  . EKG 12-Lead  . EKG    ASSESSMENT AND PLAN:   70 year old Caucasian gentleman history of ischemic cardio myopathy ejection fraction 25% who is presenting with altered mental status admitted to the hospital 06/15/15  1. Acute encephalopathy, metabolic: Secondary to UTI, neuro checks, avoid sedating agents as possible, continue treatment for urinary tract infection currently on Zosyn follow culture data day 2/7  2. Acute kidney injury: Improving will decrease rate of IV fluids given history of heart failure 3. Lipidemia unspecified statin therapy 4. Essential hypertension: Continue home medications 5. Venous thromboembolism prophylactic: Heparin subcutaneous     All the records are reviewed and case discussed with Care Management/Social Workerr. Management plans discussed with the patient, family and they are in agreement.  CODE STATUS: Full  TOTAL TIME TAKING CARE OF THIS PATIENT: 31 minutes.   POSSIBLE D/C IN 2-3 DAYS, DEPENDING ON CLINICAL CONDITION.   Hower,  Mardi Mainland.D on 06/16/2015 at  2:15 PM  Between 7am to 6pm - Pager - [phone]  After 6pm: House Pager: - [phone]  [facility]  Office  [phone]  CC: Primary care physician; Dellia Nims, MD

## 2015-06-17 LAB — URINE CULTURE

## 2015-06-17 MED ORDER — DEXTROSE 5 % IV SOLN
1.0000 g | INTRAVENOUS | Status: DC
  Administered 2015-06-17: 1 g via INTRAVENOUS
  Filled 2015-06-17 (×2): qty 10

## 2015-06-17 NOTE — Progress Notes (Signed)
[facility] - [place] at [facility]   PATIENT NAME: Andrew [name]    MR#:  [id number]  DATE OF BIRTH:  [birth year]  SUBJECTIVE:  Remains confused this morning no issues overnight  REVIEW OF SYSTEMS:  Unable to obtain given patient's mental status at this time  DRUG ALLERGIES:   Allergies  Allergen Reactions  . Amiodarone Other (See Comments)    Reaction:  Unknown     VITALS:  Blood pressure 115/58, pulse 63, temperature 97.6 F (36.4 C), temperature source Oral, resp. rate 20, height 5' 7.5" (1.715 m), weight 78.971 kg (174 lb 1.6 oz), SpO2 98 %.  PHYSICAL EXAMINATION:  VITAL SIGNS: Filed Vitals:   06/16/15 2108 06/17/15 0524  BP: 137/62 115/58  Pulse: 60 63  Temp: 98.2 F (36.8 C) 97.6 F (36.4 C)  Resp: 20 20   GENERAL:69 y.o.male currently in no acute distress.  HEAD: Normocephalic, atraumatic.  EYES: Pupils equal, round, reactive to light. Extraocular muscles intact. No scleral icterus.  MOUTH: Moist mucosal membrane. Dentition intact. No abscess noted.  EAR, NOSE, THROAT: Clear without exudates. No external lesions.  NECK: Supple. No thyromegaly. No nodules. No JVD.  PULMONARY: Clear to ascultation, without wheeze rails or rhonci. No use of accessory muscles, Good respiratory effort. good air entry bilaterally CHEST: Nontender to palpation.  CARDIOVASCULAR: S1 and S2. Regular rate and rhythm. No murmurs, rubs, or gallops. No edema. Pedal pulses 2+ bilaterally.  GASTROINTESTINAL: Soft, nontender, nondistended. No masses. Positive bowel sounds. No hepatosplenomegaly.  MUSCULOSKELETAL: No swelling, clubbing, or edema. Range of motion full in all extremities.  NEUROLOGIC: Cranial nerves II through XII are intact. No gross focal neurological deficits. Sensation intact. Reflexes intact.  SKIN: No ulceration, lesions, rashes, or cyanosis. Skin warm and dry. Turgor intact.  PSYCHIATRIC: Mood, affect within normal limits. The patient is awake,  alert and oriented x 3. Insight, judgment intact.      LABORATORY PANEL:   CBC  Recent Labs Lab 06/16/15 0448  WBC 5.1  HGB 11.7*  HCT 34.8*  PLT 153   ------------------------------------------------------------------------------------------------------------------  Chemistries   Recent Labs Lab 06/15/15 1451 06/16/15 0448  NA 132* 136  K 5.0 4.7  CL 103 109  CO2 21* 18*  GLUCOSE 93 93  BUN 42* 33*  CREATININE 2.69* 1.88*  CALCIUM 9.0 8.6*  MG 2.0  --    ------------------------------------------------------------------------------------------------------------------  Cardiac Enzymes No results for input(s): TROPONINI in the last 168 hours. ------------------------------------------------------------------------------------------------------------------  RADIOLOGY:  Ct Renal Stone Study  06/15/2015  CLINICAL DATA:  70 year old male with acute left flank pain. History of renal stent and bladder cancer. EXAM: CT ABDOMEN AND PELVIS WITHOUT CONTRAST TECHNIQUE: Multidetector CT imaging of the abdomen and pelvis was performed following the standard protocol without IV contrast. COMPARISON:  10/25/2009 CT FINDINGS: Please note that parenchymal abnormalities may be missed without intravenous contrast. Lower chest:  Cardiomegaly and ICD/pacemaker leads again noted. Hepatobiliary: The liver and gallbladder are unremarkable. There is no evidence of biliary dilatation. Pancreas: Unremarkable Spleen: Unremarkable Adrenals/Urinary Tract: A right urinary stent is present with tips in the right renal pelvis and bladder. No urinary calculi are identified. There is no evidence of hydronephrosis. There is mild bladder wall thickening, slightly greater along the posterior left aspect of the bladder. Stomach/Bowel: There is no evidence of bowel obstruction or a definite bowel abnormality. The appendix is normal. Vascular/Lymphatic: Aortic atherosclerotic calcifications noted without aneurysm.  No enlarged lymph nodes are identified. Reproductive: Prostate grossly unremarkable. Other: No free  fluid, focal collection or pneumoperitoneum. Musculoskeletal: No acute or suspicious abnormalities. Degenerative changes within the lumbar spine identified, moderate -severe at L5-S1. IMPRESSION: Right urinary stent as described. No evidence of urinary calculi or hydronephrosis. Mild bladder wall thickening, eccentric along the posterior left aspect. Consider direct inspection or further evaluation. Aortic atherosclerosis without aneurysm. Lumbar spine degenerative changes. Electronically Signed   By: Harmon Pier M.D.   On: 06/15/2015 17:25    EKG:   Orders placed or performed during the hospital encounter of 01/14/15  . EKG 12-Lead  . EKG 12-Lead  . EKG    ASSESSMENT AND PLAN:   70 year old Caucasian gentleman history of ischemic cardio myopathy ejection fraction 25% who is presenting with altered mental status admitted to the hospital 06/15/15  1. Acute encephalopathy, metabolic: Secondary to UTI, neuro checks, avoid sedating agents as possible, urine culture negative will downgrade antibiotics for a total 5 day course  Antibiotics 3/5 - now ceftriaxone  2. Acute kidney injury: Improving  3. Lipidemia unspecified statin therapy 4. Essential hypertension: Continue home medications 5. Venous thromboembolism prophylactic: Heparin subcutaneous  Disposition physical therapy    All the records are reviewed and case discussed with Care Management/Social Workerr. Management plans discussed with the patient, family and they are in agreement.  CODE STATUS: Full  TOTAL TIME TAKING CARE OF THIS PATIENT: 28 minutes.   POSSIBLE D/C IN 1-2 DAYS, DEPENDING ON CLINICAL CONDITION.   Dominyck Reser,  Mardi Mainland.D on 06/17/2015 at 11:16 AM  Between 7am to 6pm - Pager - [phone]  After 6pm: House Pager: - [phone]  [facility]  Office  [phone]  CC: Primary care physician;  Dellia Nims, MD

## 2015-06-17 NOTE — Evaluation (Signed)
Physical Therapy Evaluation Patient Details Name: LYON DUMONT MRN: [id number] DOB: 1945-09-14 Today's Date: 06/17/2015   History of Present Illness  presnted to ER secondary to visual hallucinations, noted to be hypotensive and in acute renal failure; admitted with sepsis and acute encephalopathy secondary to UTI, questionable pyelonephritis  Clinical Impression  Upon evaluation, patient alert and oriented to basic information; follows simple commands and demonstrates fair insight/safety awareness.  Bilat UE/LE strength and ROM grossly WFL and symmetrical, functional for basic transfers and mobility.  Denies pain at this time.  Currently able to complete bed mobility with mod indep; sit/stand, basic transfers and gait (220') with RW, cga/min assist.  Mild higher-level balance deficits noted as indicated by limited standing functional reach (4-5"), and decreased functional gait speed (10' walk time, 6-7 seconds).  Do recommend continued use of RW for all mobility at this time; patient voiced understanding. Would benefit from skilled PT to address above deficits and promote optimal return to PLOF; Recommend transition to HHPT upon discharge from acute hospitalization.     Follow Up Recommendations Home health PT (may progress to no PT needs upon discharge; will continue to monitor)    Equipment Recommendations       Recommendations for Other Services       Precautions / Restrictions Precautions Precautions: Fall;ICD/Pacemaker Restrictions Weight Bearing Restrictions: No      Mobility  Bed Mobility Overal bed mobility: Modified Independent                Transfers Overall transfer level: Needs assistance Equipment used: Rolling walker (2 wheeled) Transfers: Sit to/from Stand Sit to Stand: Min guard;Min assist            Ambulation/Gait Ambulation/Gait assistance: Min guard;Min assist Ambulation Distance (Feet): 220 Feet Assistive device: Rolling walker (2  wheeled)   Gait velocity: 10' walk time, 6-7 seconds   General Gait Details: reciprocal stepping pattern with fair cadence/gait speed; fair trunk rotation and gait fluidity.  Able to complete head turns, start/stop and changes of direction without overt LOB; however, do recommend continued use of RW for higher-level balance deficits.  Stairs            Wheelchair Mobility    Modified Rankin (Stroke Patients Only)       Balance Overall balance assessment: Needs assistance Sitting-balance support: No upper extremity supported;Feet supported Sitting balance-Leahy Scale: Good     Standing balance support: No upper extremity supported Standing balance-Leahy Scale: Fair Standing balance comment: standing functional reach without UE support, 4-5" from immediate BOS                             Pertinent Vitals/Pain Pain Assessment: No/denies pain    Home Living Family/patient expects to be discharged to:: Private residence Living Arrangements: Spouse/significant other Available Help at Discharge: Family Type of Home: House Home Access: Stairs to enter Entrance Stairs-Rails: Right Entrance Stairs-Number of Steps: 6 Home Layout: One level Home Equipment: Walker - 2 wheels      Prior Function Level of Independence: Independent with assistive device(s)         Comments: Mod indep for ADLs and household mobility; participates with very limited community mobility/activities.  Denies fall history.     Hand Dominance        Extremity/Trunk Assessment   Upper Extremity Assessment: Overall WFL for tasks assessed           Lower Extremity Assessment: Overall [facility] Fall  for tasks assessed         Communication   Communication: No difficulties  Cognition Arousal/Alertness: Awake/alert Behavior During Therapy: WFL for tasks assessed/performed Overall Cognitive Status: Impaired/Different from baseline (oriented to person, place, general situation; mildly  confused, but follows simple commands)                      General Comments      Exercises        Assessment/Plan    PT Assessment Patient needs continued PT services  PT Diagnosis Difficulty walking;Generalized weakness   PT Problem List Decreased activity tolerance;Decreased balance;Decreased mobility;Decreased cognition;Decreased safety awareness  PT Treatment Interventions DME instruction;Gait training;Stair training;Functional mobility training;Therapeutic activities;Therapeutic exercise;Balance training;Patient/family education   PT Goals (Current goals can be found in the Care Plan section) Acute Rehab PT Goals Patient Stated Goal: "to return home" PT Goal Formulation: With patient Time For Goal Achievement: 07/01/15 Potential to Achieve Goals: Good    Frequency Min 2X/week   Barriers to discharge        Co-evaluation               End of Session Equipment Utilized During Treatment: Gait belt Activity Tolerance: Patient tolerated treatment well Patient left: in chair;with call bell/phone within reach;with chair alarm set Nurse Communication: Mobility status         Time: 4098-1191 PT Time Calculation (min) (ACUTE ONLY): 14 min   Charges:   PT Evaluation $PT Eval Low Complexity: 1 Procedure     PT G Codes:        Britley Gashi H. Manson Passey, PT, DPT, NCS 06/17/2015, 2:36 PM [phone]

## 2015-06-17 NOTE — Plan of Care (Signed)
Problem: Education: Goal: Knowledge of [facility] Education information/materials will improve Outcome: Progressing Pt confused at times; continue to reinforce education

## 2015-06-18 LAB — BASIC METABOLIC PANEL
Anion gap: 3 — ABNORMAL LOW (ref 5–15)
BUN: 14 mg/dL (ref 6–20)
CALCIUM: 8.3 mg/dL — AB (ref 8.9–10.3)
CHLORIDE: 115 mmol/L — AB (ref 101–111)
CO2: 23 mmol/L (ref 22–32)
CREATININE: 1.04 mg/dL (ref 0.61–1.24)
GFR calc non Af Amer: >60 mL/min (ref >60)
Glucose, Bld: 108 mg/dL — ABNORMAL HIGH (ref 65–99)
Potassium: 4.4 mmol/L (ref 3.5–5.1)
SODIUM: 141 mmol/L (ref 135–145)

## 2015-06-18 MED ORDER — CEFUROXIME AXETIL 500 MG PO TABS: 500.0000 mg | ORAL_TABLET | Freq: Two times a day (BID) | ORAL | Status: DC

## 2015-06-18 NOTE — Care Management (Signed)
Patient presents from home with sepsis.  Patient States that he lives at home with his wife.  Patient states that he has a cane in walker in the home if he she need it.  Patient states that he obtains his medication from the [facility] in [place].  Patient information faxed to Jacki Cones at the [facility] [phone].  Jacki Cones states that she will notify patient PCP Dr. Hardie Lora of the need for home health PT at the time of discharge.  Call back from [place] at the [facility] to notify me that there was not a preference of home health agency for the patient.  I provided the patient with agency list and no preference was indicated.  Referral was made to [facility].  Barbara Cower with [facility] notified of plan for discharge.  RNCM signing off

## 2015-06-18 NOTE — Discharge Instructions (Signed)
Finish antibiotic course 2 more days including today Notify MD for any worsening pain not relieved with medications, nausea, vomiting or fever of 101 or higher.

## 2015-06-18 NOTE — Progress Notes (Signed)
MD ordered patient to be discharged home.  Discharge instructions were reviewed with the patient and wife and they voiced understanding.  Prescription was given to the patient.  [facility].  IVs were removed by the nursing student.  All questions were answered.  Patient left via wheelchair escorted by auxillary.

## 2015-06-18 NOTE — Care Management Important Message (Signed)
Important Message  Patient Details  Name: ALPHONZO DEVERA MRN: [id number] Date of Birth: [birth year]   Medicare Important Message Given:  Yes    Olegario Messier A Saahas Hidrogo 06/18/2015, 10:47 AM

## 2015-06-18 NOTE — Discharge Summary (Signed)
[facility] - [place] at [facility]   PATIENT NAME: Andrew [name]    MR#:  [id number]  DATE OF BIRTH:  02-10-1946  DATE OF ADMISSION:  06/15/2015 ADMITTING PHYSICIAN: Katharina Caper, MD  DATE OF DISCHARGE: No discharge date for patient encounter.  PRIMARY CARE PHYSICIAN: Dellia Nims, MD    ADMISSION DIAGNOSIS:  Altered mental status  DISCHARGE DIAGNOSIS:  Metabolic encephalopathy secondary to urinary tract infection  SECONDARY DIAGNOSIS:   Past Medical History  Diagnosis Date  . Ischemic cardiomyopathy     a. EF 25-35%;   b. echo 3/11: EF 35%, mild LVH, severe inf/post HK, lat and apical HK, grade 1 diast dysfxn, mild AI, mild to mod MR, PASP 32  . CAD (coronary artery disease)     a. s/p CABG 1997;  b. s/p PCI to [facility] 2006;  c. s/p BMS x 3 to S-RCA 10/10;  d.  Myoview 2010: EF 24%, basal inf, mid inf. apical inf, basal inf-lat and mid IL scar, no isch;  e. cath 1/11: S-RCA occluded, S-OM chronically occluded, L-LAD ok with L-R collaterals (med. Tx)  . Ventricular tachycardia (HCC)     a. s/p AICD 2006;   b. Amio intolerant due to liver and thyroid issues;   c. 2/12:  quinidine added to mexilitine  (consider Tikosyn in future??)  . Systolic CHF, chronic (HCC)   . Atrial fibrillation (HCC)   . HTN (hypertension)   . HLD (hyperlipidemia)     HOSPITAL COURSE:  Andrew [name]  is a 70 y.o. male admitted 06/15/2015 with chief complaint confusion, visual hallucinations admitted 06/15/15. Please see H&P performed by Dr. Denzil Magnuson for full information. The patient is started on broad-spectrum antibiotics after culture data obtained. She remained remarkably confused/altered for the first 2 days of his hospital course. However, he made a marked improvement back to baseline on the day of discharge. Culture data has insignificant growth which was not further identified. I will have him finish the course of antibiotics total 5 days for urinary tract infection with  encephalopathy  DISCHARGE CONDITIONS:   Stable/improved  CONSULTS OBTAINED:  Treatment Team:  Wyatt Haste, MD  DRUG ALLERGIES:   Allergies  Allergen Reactions  . Amiodarone Other (See Comments)    Reaction:  Unknown     DISCHARGE MEDICATIONS:   Current Discharge Medication List    START taking these medications   Details  cefUROXime (CEFTIN) 500 MG tablet Take 1 tablet (500 mg total) by mouth 2 (two) times daily with a meal. Qty: 4 tablet, Refills: 0      CONTINUE these medications which have NOT CHANGED   Details  acetaminophen (TYLENOL) 325 MG tablet Take 975 mg by mouth 3 (three) times daily.     aspirin EC 81 MG tablet Take 81 mg by mouth at bedtime.    carvedilol (COREG) 12.5 MG tablet Take 12.5 mg by mouth 2 (two) times daily with a meal.      citalopram (CELEXA) 20 MG tablet Take 20 mg by mouth at bedtime.    clopidogrel (PLAVIX) 75 MG tablet Take 75 mg by mouth at bedtime.    finasteride (PROSCAR) 5 MG tablet Take 5 mg by mouth daily.    fluconazole (DIFLUCAN) 200 MG tablet Take 200 mg by mouth daily.    furosemide (LASIX) 20 MG tablet Take 20 mg by mouth at bedtime.    gabapentin (NEURONTIN) 400 MG capsule Take 1,200 mg by mouth 3 (three) times daily.  hydrocerin (EUCERIN) CREA Apply 1 application topically 2 (two) times daily as needed (for irritation).    hydrocortisone 2.5 % ointment Apply 1 application topically 2 (two) times daily as needed (for itching).    isosorbide mononitrate (IMDUR) 30 MG 24 hr tablet Take 30 mg by mouth daily as needed (for increased BP).    lisinopril (PRINIVIL,ZESTRIL) 10 MG tablet Take 10 mg by mouth daily.    Menthol-Methyl Salicylate (THERA-GESIC EX) Apply 1 application topically 3 (three) times daily as needed (for pain).    methocarbamol (ROBAXIN) 750 MG tablet Take 750 mg by mouth 2 (two) times daily as needed for muscle spasms.    metoprolol succinate (TOPROL-XL) 25 MG 24 hr tablet Take 25 mg by mouth  daily.    mexiletine (MEXITIL) 250 MG capsule Take 250 mg by mouth 2 (two) times daily.      niacin 500 MG tablet Take 500 mg by mouth at bedtime.    nitroGLYCERIN (NITROSTAT) 0.4 MG SL tablet Place 0.4 mg under the tongue every 5 (five) minutes as needed for chest pain.     Omega-3 Fatty Acids (FISH OIL) 1000 MG CAPS Take 1,000 mg by mouth at bedtime.    omeprazole (PRILOSEC) 20 MG capsule Take 40 mg by mouth 2 (two) times daily before a meal.    quiniDINE gluconate 324 MG CR tablet Take 324 mg by mouth 2 (two) times daily.    simvastatin (ZOCOR) 40 MG tablet Take 40 mg by mouth at bedtime.    sulfamethoxazole-trimethoprim (BACTRIM DS,SEPTRA DS) 800-160 MG tablet Take 1 tablet by mouth 2 (two) times daily.    Tamsulosin HCl (FLOMAX) 0.4 MG CAPS Take 0.4 mg by mouth 2 (two) times daily.          DISCHARGE INSTRUCTIONS:    DIET:  Cardiac diet  DISCHARGE CONDITION:  Stable  ACTIVITY:  Activity as tolerated  OXYGEN:  Home Oxygen: No.    Oxygen Delivery: room air  DISCHARGE LOCATION:  home with home physical therapy  If you experience worsening of your admission symptoms, develop shortness of breath, life threatening emergency, suicidal or homicidal thoughts you must seek medical attention immediately by calling 911 or calling your MD immediately  if symptoms less severe.  You Must read complete instructions/literature along with all the possible adverse reactions/side effects for all the Medicines you take and that have been prescribed to you. Take any new Medicines after you have completely understood and accpet all the possible adverse reactions/side effects.   Please note  You were cared for by a hospitalist during your hospital stay. If you have any questions about your discharge medications or the care you received while you were in the hospital after you are discharged, you can call the unit and asked to speak with the hospitalist on call if the hospitalist that  took care of you is not available. Once you are discharged, your primary care physician will handle any further medical issues. Please note that NO REFILLS for any discharge medications will be authorized once you are discharged, as it is imperative that you return to your primary care physician (or establish a relationship with a primary care physician if you do not have one) for your aftercare needs so that they can reassess your need for medications and monitor your lab values.    On the day of Discharge:   VITAL SIGNS:  Blood pressure 141/69, pulse 73, temperature 98.2 F (36.8 C), temperature source Oral, resp. rate 16, height  5' 7.5" (1.715 m), weight 78.971 kg (174 lb 1.6 oz), SpO2 98 %.  I/O:   Intake/Output Summary (Last 24 hours) at 06/18/15 0947 Last data filed at 06/18/15 0754  Gross per 24 hour  Intake   2388 ml  Output   1300 ml  Net   1088 ml    PHYSICAL EXAMINATION:  GENERAL:  70 y.o.-year-old patient lying in the bed with no acute distress.  EYES: Pupils equal, round, reactive to light and accommodation. No scleral icterus. Extraocular muscles intact.  HEENT: Head atraumatic, normocephalic. Oropharynx and nasopharynx clear.  NECK:  Supple, no jugular venous distention. No thyroid enlargement, no tenderness.  LUNGS: Normal breath sounds bilaterally, no wheezing, rales,rhonchi or crepitation. No use of accessory muscles of respiration.  CARDIOVASCULAR: S1, S2 normal. No murmurs, rubs, or gallops.  ABDOMEN: Soft, non-tender, non-distended. Bowel sounds present. No organomegaly or mass.  EXTREMITIES: No pedal edema, cyanosis, or clubbing.  NEUROLOGIC: Cranial nerves II through XII are intact. Muscle strength 5/5 in all extremities. Sensation intact. Gait not checked.  PSYCHIATRIC: The patient is alert and oriented x 3.  SKIN: No obvious rash, lesion, or ulcer.   DATA REVIEW:   CBC  Recent Labs Lab 06/16/15 0448  WBC 5.1  HGB 11.7*  HCT 34.8*  PLT 153     Chemistries   Recent Labs Lab 06/15/15 1451  06/18/15 0429  NA 132*  < > 141  K 5.0  < > 4.4  CL 103  < > 115*  CO2 21*  < > 23  GLUCOSE 93  < > 108*  BUN 42*  < > 14  CREATININE 2.69*  < > 1.04  CALCIUM 9.0  < > 8.3*  MG 2.0  --   --   < > = values in this interval not displayed.  Cardiac Enzymes No results for input(s): TROPONINI in the last 168 hours.  Microbiology Results  Results for orders placed or performed during the hospital encounter of 06/15/15  Urine culture     Status: None   Collection Time: 06/15/15  5:56 PM  Result Value Ref Range Status   Specimen Description URINE, RANDOM  Final   Special Requests NONE  Final   Culture INSIGNIFICANT GROWTH  Final   Report Status 06/17/2015 FINAL  Final    RADIOLOGY:  No results found.   Management plans discussed with the patient, family and they are in agreement.  CODE STATUS:     Code Status Orders        Start     Ordered   06/16/15 0003  Full code   Continuous     06/16/15 0002    Code Status History    Date Active Date Inactive Code Status Order ID Comments User Context   This patient has a current code status but no historical code status.    Advance Directive Documentation        Most Recent Value   Type of Advance Directive  Living will   Pre-existing out of facility DNR order (yellow form or pink MOST form)     "MOST" Form in Place?        TOTAL TIME TAKING CARE OF THIS PATIENT: 28 minutes.    Andrew [name],  Mardi Mainland.D on 06/18/2015 at 9:47 AM  Between 7am to 6pm - Pager - [phone]  After 6pm go to [email or web address] - password [email or web address] [facility]  [place] [facility]  Office  [phone]  CC: Primary care physician; Sheilah Mins  E, MD

## 2016-10-27 ENCOUNTER — Encounter: Payer: Self-pay | Admitting: *Deleted

## 2016-10-27 ENCOUNTER — Encounter: Payer: Non-veteran care | Attending: Internal Medicine | Admitting: *Deleted

## 2016-10-27 VITALS — Ht 66.9 in | Wt 195.2 lb

## 2016-10-27 DIAGNOSIS — I5022 Chronic systolic (congestive) heart failure: Secondary | ICD-10-CM

## 2016-10-27 DIAGNOSIS — I519 Heart disease, unspecified: Secondary | ICD-10-CM | POA: Diagnosis not present

## 2016-10-27 NOTE — Patient Instructions (Signed)
Patient Instructions  Patient Details  Name: Andrew [name] MRN: [id number] Date of Birth: [birth year] Referring Provider:  [facility]  Below are the personal goals you chose as well as exercise and nutrition goals. Our goal is to help you keep on track towards obtaining and maintaining your goals. We will be discussing your progress on these goals with you throughout the program.  Initial Exercise Prescription:     Initial Exercise Prescription - 10/27/16 1400      Date of Initial Exercise RX and Referring Provider   Date 10/27/16   Referring Provider [facility]     Treadmill   MPH 1.3   Grade 0   Minutes 15   METs 2     NuStep   Level 1   SPM 80   Minutes 15   METs 2     REL-XR   Level 1   Speed 50   Minutes 15   METs 2     Prescription Details   Frequency (times per week) 2   Duration Progress to 45 minutes of aerobic exercise without signs/symptoms of physical distress     Intensity   THRR 40-80% of Max Heartrate 102-133   Ratings of Perceived Exertion 11-13   Perceived Dyspnea 0-4     Progression   Progression Continue to progress workloads to maintain intensity without signs/symptoms of physical distress.     Resistance Training   Training Prescription Yes   Weight 3 lbs      Exercise Goals: Frequency: Be able to perform aerobic exercise three times per week working toward 3-5 days per week.  Intensity: Work with a perceived exertion of 11 (fairly light) - 15 (hard) as tolerated. Follow your new exercise prescription and watch for changes in prescription as you progress with the program. Changes will be reviewed with you when they are made.  Duration: You should be able to do 30 minutes of continuous aerobic exercise in addition to a 5 minute warm-up and a 5 minute cool-down routine.  Nutrition Goals: Your personal nutrition goals will be established when you do your nutrition analysis with the dietician.  The following are nutrition  guidelines to follow: Cholesterol < 200mg /day Sodium < 1500mg /day Fiber: Men over 50 yrs - 30 grams per day  Personal Goals:     Personal Goals and Risk Factors at Admission - 10/27/16 1332      Core Components/Risk Factors/Patient Goals on Admission    Weight Management Yes;Weight Loss;Obesity   Intervention Weight Management: Develop a combined nutrition and exercise program designed to reach desired caloric intake, while maintaining appropriate intake of nutrient and fiber, sodium and fats, and appropriate energy expenditure required for the weight goal.;Weight Management: Provide education and appropriate resources to help participant work on and attain dietary goals.;Weight Management/Obesity: Establish reasonable short term and long term weight goals.;Obesity: Provide education and appropriate resources to help participant work on and attain dietary goals.   Admit Weight 195 lb 3.2 oz (88.5 kg)   Goal Weight: Short Term 193 lb (87.5 kg)   Goal Weight: Long Term 174 lb (78.9 kg)   Expected Outcomes Short Term: Continue to assess and modify interventions until short term weight is achieved;Long Term: Adherence to nutrition and physical activity/exercise program aimed toward attainment of established weight goal;Weight Loss: Understanding of general recommendations for a balanced deficit meal plan, which promotes 1-2 lb weight loss per week and includes a negative energy balance of [phone] kcal/d;Understanding recommendations for meals  to include 15-35% energy as protein, 25-35% energy from fat, 35-60% energy from carbohydrates, less than 200mg  of dietary cholesterol, 20-35 gm of total fiber daily;Understanding of distribution of calorie intake throughout the day with the consumption of 4-5 meals/snacks   Diabetes --  Erroneous charting: Does not have diabetes   Intervention Provide education about signs/symptoms and action to take for hypo/hyperglycemia.;Provide education about proper  nutrition, including hydration, and aerobic/resistive exercise prescription along with prescribed medications to achieve blood glucose in normal ranges: Fasting glucose 65-99 mg/dL   Expected Outcomes Short Term: Participant verbalizes understanding of the signs/symptoms and immediate care of hyper/hypoglycemia, proper foot care and importance of medication, aerobic/resistive exercise and nutrition plan for blood glucose control.;Long Term: Attainment of HbA1C < 7%.   Heart Failure Yes   Intervention Provide a combined exercise and nutrition program that is supplemented with education, support and counseling about heart failure. Directed toward relieving symptoms such as shortness of breath, decreased exercise tolerance, and extremity edema.   Expected Outcomes Improve functional capacity of life;Short term: Attendance in program 2-3 days a week with increased exercise capacity. Reported lower sodium intake. Reported increased fruit and vegetable intake. Reports medication compliance.;Short term: Daily weights obtained and reported for increase. Utilizing diuretic protocols set by physician.;Long term: Adoption of self-care skills and reduction of barriers for early signs and symptoms recognition and intervention leading to self-care maintenance.   Hypertension Yes   Intervention Provide education on lifestyle modifcations including regular physical activity/exercise, weight management, moderate sodium restriction and increased consumption of fresh fruit, vegetables, and low fat dairy, alcohol moderation, and smoking cessation.;Monitor prescription use compliance.   Expected Outcomes Short Term: Continued assessment and intervention until BP is < 140/890mm HG in hypertensive participants. < 130/5780mm HG in hypertensive participants with diabetes, heart failure or chronic kidney disease.;Long Term: Maintenance of blood pressure at goal levels.   Lipids Yes   Intervention Provide education and support for  participant on nutrition & aerobic/resistive exercise along with prescribed medications to achieve LDL 70mg , HDL >40mg .   Expected Outcomes Short Term: Participant states understanding of desired cholesterol values and is compliant with medications prescribed. Participant is following exercise prescription and nutrition guidelines.;Long Term: Cholesterol controlled with medications as prescribed, with individualized exercise RX and with personalized nutrition plan. Value goals: LDL < 70mg , HDL > 40 mg.   Stress Yes  Fianacial and family. "Wife spends monet when she shouldn't"  and she has chronic illness, neither can cook meals at home, so have to pick up meals. Unable to do what he wants without needing to stop and rest. Wants to be able to do more .    Intervention Offer individual and/or small group education and counseling on adjustment to heart disease, stress management and health-related lifestyle change. Teach and support self-help strategies.;Refer participants experiencing significant psychosocial distress to appropriate mental health specialists for further evaluation and treatment. When possible, include family members and significant others in education/counseling sessions.   Expected Outcomes Short Term: Participant demonstrates changes in health-related behavior, relaxation and other stress management skills, ability to obtain effective social support, and compliance with psychotropic medications if prescribed.;Long Term: Emotional wellbeing is indicated by absence of clinically significant psychosocial distress or social isolation.      Tobacco Use Initial Evaluation: History  Smoking Status  . Former Smoker  . Packs/day: 1.00  . Years: 50.00  . Types: Cigarettes  . Quit date: 05/05/2002  Smokeless Tobacco  . Never Used    Copy of goals  given to participant.

## 2016-10-27 NOTE — Progress Notes (Signed)
Cardiac Individual Treatment Plan  Patient Details  Name: Andrew [name] MRN: [id number] Date of Birth: [birth year] Referring Provider:     Cardiac Rehab from 10/27/2016 in [facility] Cardiac and Pulmonary Rehab  Referring Provider  [facility]      Initial Encounter Date:    Cardiac Rehab from 10/27/2016 in [facility] Cardiac and Pulmonary Rehab  Date  10/27/16  Referring Provider  [facility]      Visit Diagnosis: Heart failure, chronic systolic (HCC)  Patient's Home Medications on Admission:  Current Outpatient Prescriptions:  .  acetaminophen (TYLENOL) 325 MG tablet, Take 975 mg by mouth 3 (three) times daily. , Disp: , Rfl:  .  aspirin EC 81 MG tablet, Take 81 mg by mouth at bedtime., Disp: , Rfl:  .  carvedilol (COREG) 12.5 MG tablet, Take 12.5 mg by mouth 2 (two) times daily with a meal.  , Disp: , Rfl:  .  cefUROXime (CEFTIN) 500 MG tablet, Take 1 tablet (500 mg total) by mouth 2 (two) times daily with a meal., Disp: 4 tablet, Rfl: 0 .  citalopram (CELEXA) 20 MG tablet, Take 20 mg by mouth at bedtime., Disp: , Rfl:  .  clopidogrel (PLAVIX) 75 MG tablet, Take 75 mg by mouth at bedtime., Disp: , Rfl:  .  finasteride (PROSCAR) 5 MG tablet, Take 5 mg by mouth daily., Disp: , Rfl:  .  fluconazole (DIFLUCAN) 200 MG tablet, Take 200 mg by mouth daily., Disp: , Rfl:  .  furosemide (LASIX) 20 MG tablet, Take 20 mg by mouth at bedtime., Disp: , Rfl:  .  gabapentin (NEURONTIN) 400 MG capsule, Take 1,200 mg by mouth 3 (three) times daily., Disp: , Rfl:  .  hydrocerin (EUCERIN) CREA, Apply 1 application topically 2 (two) times daily as needed (for irritation)., Disp: , Rfl:  .  hydrocortisone 2.5 % ointment, Apply 1 application topically 2 (two) times daily as needed (for itching)., Disp: , Rfl:  .  isosorbide mononitrate (IMDUR) 30 MG 24 hr tablet, Take 30 mg by mouth daily as needed (for increased BP)., Disp: , Rfl:  .  lisinopril (PRINIVIL,ZESTRIL) 10 MG tablet, Take 10 mg by mouth daily., Disp: ,  Rfl:  .  Menthol-Methyl Salicylate (THERA-GESIC EX), Apply 1 application topically 3 (three) times daily as needed (for pain)., Disp: , Rfl:  .  methocarbamol (ROBAXIN) 750 MG tablet, Take 750 mg by mouth 2 (two) times daily as needed for muscle spasms., Disp: , Rfl:  .  metoprolol succinate (TOPROL-XL) 25 MG 24 hr tablet, Take 25 mg by mouth daily., Disp: , Rfl:  .  mexiletine (MEXITIL) 250 MG capsule, Take 250 mg by mouth 2 (two) times daily.  , Disp: , Rfl:  .  niacin 500 MG tablet, Take 500 mg by mouth at bedtime., Disp: , Rfl:  .  nitroGLYCERIN (NITROSTAT) 0.4 MG SL tablet, Place 0.4 mg under the tongue every 5 (five) minutes as needed for chest pain. , Disp: , Rfl:  .  Omega-3 Fatty Acids (FISH OIL) 1000 MG CAPS, Take 1,000 mg by mouth at bedtime., Disp: , Rfl:  .  omeprazole (PRILOSEC) 20 MG capsule, Take 40 mg by mouth 2 (two) times daily before a meal., Disp: , Rfl:  .  quiniDINE gluconate 324 MG CR tablet, Take 324 mg by mouth 2 (two) times daily., Disp: , Rfl:  .  Tamsulosin HCl (FLOMAX) 0.4 MG CAPS, Take 0.4 mg by mouth 2 (two) times daily. , Disp: , Rfl:  .  simvastatin (ZOCOR) 40 MG tablet, Take 40 mg by mouth at bedtime., Disp: , Rfl:   Past Medical History: Past Medical History:  Diagnosis Date  . Atrial fibrillation (HCC)   . CAD (coronary artery disease)    a. s/p CABG 1997;  b. s/p PCI to [facility] 2006;  c. s/p BMS x 3 to S-RCA 10/10;  d.  Myoview 2010: EF 24%, basal inf, mid inf. apical inf, basal inf-lat and mid IL scar, no isch;  e. cath 1/11: S-RCA occluded, S-OM chronically occluded, L-LAD ok with L-R collaterals (med. Tx)  . HLD (hyperlipidemia)   . HTN (hypertension)   . Ischemic cardiomyopathy    a. EF 25-35%;   b. echo 3/11: EF 35%, mild LVH, severe inf/post HK, lat and apical HK, grade 1 diast dysfxn, mild AI, mild to mod MR, PASP 32  . Systolic CHF, chronic (HCC)   . Ventricular tachycardia (HCC)    a. s/p AICD 2006;   b. Amio intolerant due to liver and thyroid  issues;   c. 2/12:  quinidine added to mexilitine  (consider Tikosyn in future??)    Tobacco Use: History  Smoking Status  . Former Smoker  . Packs/day: 1.00  . Years: 50.00  . Types: Cigarettes  . Quit date: 05/05/2002  Smokeless Tobacco  . Never Used    Labs: Recent Review Flowsheet Data    Labs for ITP Cardiac and Pulmonary Rehab Latest Ref Rng & Units 05/06/2009 05/07/2009 10/15/2009 01/08/2010 05/29/2010   Cholestrol 0 - 200 mg/dL - 161 ATP III CLASSIFICATION: <200     mg/dL   Desirable 096-045  mg/dL   Borderline High >=409    mg/dL   High 811(B) 147 -   LDLCALC 0 - 99 mg/dL - 52 Total Cholesterol/HDL:CHD Risk Coronary Heart Disease Risk Table Men   Women 1/2 Average Risk   3.4   3.3 Average Risk       5.0   4.4 2 X Average Risk   9.6   7.1 3 X Average Risk  23.4   11.0 Use the calculated Patient Ratio above and the CHD Risk Table to determine the patient's CHD Risk. ATP III CLASSIFICATION (LDL): <100     mg/dL   Optimal 829-562  mg/dL   Near or Above Optimal 130-159  mg/dL   Borderline 130-865  mg/dL   High >784     mg/dL   Very High 696(E) 33 -   HDL >39 mg/dL - 95(M) 84(X) 32(G) -   Trlycerides <150 mg/dL - 401(U) 272(Z) 366(Y) -   Hemoglobin A1c 4.6 - 6.1 % 6.2 (NOTE) The ADA recommends the following therapeutic goal for glycemic control related to Hgb A1c measurement: Goal of therapy: <6.5 Hgb A1c  Reference: American Diabetes Association: Clinical Practice Recommendations 2010, Diabetes Care, 2010, 33: (Suppl 1).(H) - - - -   TCO2 0 - 100 mmol/L - - - - 29       Exercise Target Goals: Date: 10/27/16  Exercise Program Goal: Individual exercise prescription set with THRR, safety & activity barriers. Participant demonstrates ability to understand and report RPE using BORG scale, to self-measure pulse accurately, and to acknowledge the importance of the exercise prescription.  Exercise Prescription Goal: Starting with aerobic activity 30 plus minutes a day, 3  days per week for initial exercise prescription. Provide home exercise prescription and guidelines that participant acknowledges understanding prior to discharge.  Activity Barriers & Risk Stratification:     Activity Barriers & Cardiac  Risk Stratification - 10/27/16 1054      Activity Barriers & Cardiac Risk Stratification   Activity Barriers Arthritis;Joint Problems;Shortness of Breath;Back Problems;Neck/Spine Problems;Muscular Weakness;History of Falls;Balance Concerns;Assistive Device;Deconditioning  bilateral hip pain, neck pain occasionally, compressed disc L5-S1   Cardiac Risk Stratification High      6 Minute Walk:     6 Minute Walk    Row Name 10/27/16 1448         6 Minute Walk   Phase Initial     Distance 955 feet     Walk Time 5.75 minutes     # of Rest Breaks 1  15 sec     MPH 1.88     METS 2     RPE 14     Perceived Dyspnea  1     VO2 Peak 6.99     Symptoms Yes (comment)     Comments using cane, feels wobbly, hip pain 9/10, slightly SOB     Resting HR 71 bpm     Resting BP 154/66     Max Ex. HR 89 bpm     Max Ex. BP 138/74     2 Minute Post BP 132/60        Oxygen Initial Assessment:   Oxygen Re-Evaluation:   Oxygen Discharge (Final Oxygen Re-Evaluation):   Initial Exercise Prescription:     Initial Exercise Prescription - 10/27/16 1400      Date of Initial Exercise RX and Referring Provider   Date 10/27/16   Referring Provider [facility]     Treadmill   MPH 1.3   Grade 0   Minutes 15   METs 2     NuStep   Level 1   SPM 80   Minutes 15   METs 2     REL-XR   Level 1   Speed 50   Minutes 15   METs 2     Prescription Details   Frequency (times per week) 2   Duration Progress to 45 minutes of aerobic exercise without signs/symptoms of physical distress     Intensity   THRR 40-80% of Max Heartrate 102-133   Ratings of Perceived Exertion 11-13   Perceived Dyspnea 0-4     Progression   Progression Continue to progress  workloads to maintain intensity without signs/symptoms of physical distress.     Resistance Training   Training Prescription Yes   Weight 3 lbs      Perform Capillary Blood Glucose checks as needed.  Exercise Prescription Changes:     Exercise Prescription Changes    Row Name 10/27/16 1300             Response to Exercise   Blood Pressure (Admit) 154/66       Blood Pressure (Exercise) 138/74       Blood Pressure (Exit) 132/60       Heart Rate (Admit) 71 bpm       Heart Rate (Exercise) 88 bpm       Heart Rate (Exit) 75 bpm       Oxygen Saturation (Admit) 96 %       Oxygen Saturation (Exercise) 97 %       Rating of Perceived Exertion (Exercise) 14       Perceived Dyspnea (Exercise) 1       Symptoms hip pain (9/10), slightly SOB, using cane, feels wobbly       Comments walk test results  Exercise Comments:   Exercise Goals and Review:     Exercise Goals    Row Name 10/27/16 1452             Exercise Goals   Increase Physical Activity Yes       Intervention Provide advice, education, support and counseling about physical activity/exercise needs.;Develop an individualized exercise prescription for aerobic and resistive training based on initial evaluation findings, risk stratification, comorbidities and participant's personal goals.       Expected Outcomes Achievement of increased cardiorespiratory fitness and enhanced flexibility, muscular endurance and strength shown through measurements of functional capacity and personal statement of participant.       Increase Strength and Stamina Yes       Intervention Provide advice, education, support and counseling about physical activity/exercise needs.;Develop an individualized exercise prescription for aerobic and resistive training based on initial evaluation findings, risk stratification, comorbidities and participant's personal goals.       Expected Outcomes Achievement of increased cardiorespiratory fitness  and enhanced flexibility, muscular endurance and strength shown through measurements of functional capacity and personal statement of participant.          Exercise Goals Re-Evaluation :   Discharge Exercise Prescription (Final Exercise Prescription Changes):     Exercise Prescription Changes - 10/27/16 1300      Response to Exercise   Blood Pressure (Admit) 154/66   Blood Pressure (Exercise) 138/74   Blood Pressure (Exit) 132/60   Heart Rate (Admit) 71 bpm   Heart Rate (Exercise) 88 bpm   Heart Rate (Exit) 75 bpm   Oxygen Saturation (Admit) 96 %   Oxygen Saturation (Exercise) 97 %   Rating of Perceived Exertion (Exercise) 14   Perceived Dyspnea (Exercise) 1   Symptoms hip pain (9/10), slightly SOB, using cane, feels wobbly   Comments walk test results      Nutrition:  Target Goals: Understanding of nutrition guidelines, daily intake of sodium 1500mg , cholesterol 200mg , calories 30% from fat and 7% or less from saturated fats, daily to have 5 or more servings of fruits and vegetables.  Biometrics:     Pre Biometrics - 10/27/16 1452      Pre Biometrics   Height 5' 6.9" (1.699 m)   Weight 195 lb 3.2 oz (88.5 kg)   Waist Circumference 40 inches   Hip Circumference 40.25 inches   Waist to Hip Ratio 0.99 %   BMI (Calculated) 30.7   Single Leg Stand 22.34 seconds       Nutrition Therapy Plan and Nutrition Goals:     Nutrition Therapy & Goals - 10/27/16 1051      Nutrition Therapy   Drug/Food Interactions Statins/Certain Fruits     Intervention Plan   Intervention Prescribe, educate and counsel regarding individualized specific dietary modifications aiming towards targeted core components such as weight, hypertension, lipid management, diabetes, heart failure and other comorbidities.   Expected Outcomes Short Term Goal: Understand basic principles of dietary content, such as calories, fat, sodium, cholesterol and nutrients.;Short Term Goal: A plan has been  developed with personal nutrition goals set during dietitian appointment.;Long Term Goal: Adherence to prescribed nutrition plan.      Nutrition Discharge: Rate Your Plate Scores:     Nutrition Assessments - 10/27/16 1331      MEDFICTS Scores   Pre Score 48      Nutrition Goals Re-Evaluation:   Nutrition Goals Discharge (Final Nutrition Goals Re-Evaluation):   Psychosocial: Target Goals: Acknowledge presence or absence of significant depression  and/or stress, maximize coping skills, provide positive support system. Participant is able to verbalize types and ability to use techniques and skills needed for reducing stress and depression.   Initial Review & Psychosocial Screening:     Initial Psych Review & Screening - 10/27/16 1338      Initial Review   Source of Stress Concerns Chronic Illness;Family;Financial;Unable to perform yard/household activities   Comments Stress with wife spending money , stress with inablility to perform chores without stopping and resting all the time.       Quality of Life Scores:      Quality of Life - 10/27/16 1048      Quality of Life Scores   Health/Function Pre 14.6 %   Socioeconomic Pre 24.58 %   Psych/Spiritual Pre 22.29 %   Family Pre 16.8 %   GLOBAL Pre 18.38 %      PHQ-9: Recent Review Flowsheet Data    Depression screen [facility] 10/27/2016   Decreased Interest 1   Down, Depressed, Hopeless 0   PHQ - 2 Score 1   Altered sleeping 1   Tired, decreased energy 3   Change in appetite 1   Feeling bad or failure about yourself  0   Trouble concentrating 1   Moving slowly or fidgety/restless 0   Suicidal thoughts 0   PHQ-9 Score 7   Difficult doing work/chores Somewhat difficult     Interpretation of Total Score  Total Score Depression Severity:  1-4 = Minimal depression, 5-9 = Mild depression, 10-14 = Moderate depression, 15-19 = Moderately severe depression, 20-27 = Severe depression   Psychosocial Evaluation and  Intervention:   Psychosocial Re-Evaluation:   Psychosocial Discharge (Final Psychosocial Re-Evaluation):   Vocational Rehabilitation: Provide vocational rehab assistance to qualifying candidates.   Vocational Rehab Evaluation & Intervention:     Vocational Rehab - 10/27/16 1052      Initial Vocational Rehab Evaluation & Intervention   Assessment shows need for Vocational Rehabilitation No      Education: Education Goals: Education classes will be provided on a weekly basis, covering required topics. Participant will state understanding/return demonstration of topics presented.  Learning Barriers/Preferences:     Learning Barriers/Preferences - 10/27/16 1343      Learning Barriers/Preferences   Learning Barriers None   Learning Preferences Individual Instruction;Pictoral;Skilled Demonstration;Video      Education Topics: General Nutrition Guidelines/Fats and Fiber: -Group instruction provided by verbal, written material, models and posters to present the general guidelines for heart healthy nutrition. Gives an explanation and review of dietary fats and fiber.   Controlling Sodium/Reading Food Labels: -Group verbal and written material supporting the discussion of sodium use in heart healthy nutrition. Review and explanation with models, verbal and written materials for utilization of the food label.   Exercise Physiology & Risk Factors: - Group verbal and written instruction with models to review the exercise physiology of the cardiovascular system and associated critical values. Details cardiovascular disease risk factors and the goals associated with each risk factor.   Aerobic Exercise & Resistance Training: - Gives group verbal and written discussion on the health impact of inactivity. On the components of aerobic and resistive training programs and the benefits of this training and how to safely progress through these programs.   Flexibility, Balance, General  Exercise Guidelines: - Provides group verbal and written instruction on the benefits of flexibility and balance training programs. Provides general exercise guidelines with specific guidelines to those with heart or lung disease. Demonstration and  skill practice provided.   Stress Management: - Provides group verbal and written instruction about the health risks of elevated stress, cause of high stress, and healthy ways to reduce stress.   Depression: - Provides group verbal and written instruction on the correlation between heart/lung disease and depressed mood, treatment options, and the stigmas associated with seeking treatment.   Anatomy & Physiology of the Heart: - Group verbal and written instruction and models provide basic cardiac anatomy and physiology, with the coronary electrical and arterial systems. Review of: AMI, Angina, Valve disease, Heart Failure, Cardiac Arrhythmia, Pacemakers, and the ICD.   Cardiac Procedures: - Group verbal and written instruction and models to describe the testing methods done to diagnose heart disease. Reviews the outcomes of the test results. Describes the treatment choices: Medical Management, Angioplasty, or Coronary Bypass Surgery.   Cardiac Medications: - Group verbal and written instruction to review commonly prescribed medications for heart disease. Reviews the medication, class of the drug, and side effects. Includes the steps to properly store meds and maintain the prescription regimen.   Go Sex-Intimacy & Heart Disease, Get SMART - Goal Setting: - Group verbal and written instruction through game format to discuss heart disease and the return to sexual intimacy. Provides group verbal and written material to discuss and apply goal setting through the application of the S.M.A.R.T. Method.   Other Matters of the Heart: - Provides group verbal, written materials and models to describe Heart Failure, Angina, Valve Disease, and Diabetes in the  realm of heart disease. Includes description of the disease process and treatment options available to the cardiac patient.   Exercise & Equipment Safety: - Individual verbal instruction and demonstration of equipment use and safety with use of the equipment.   Cardiac Rehab from 10/27/2016 in [facility] Cardiac and Pulmonary Rehab  Date  10/27/16  Educator  SB  Instruction Review Code  2- meets goals/outcomes      Infection Prevention: - Provides verbal and written material to individual with discussion of infection control including proper hand washing and proper equipment cleaning during exercise session.   Cardiac Rehab from 10/27/2016 in [facility] Cardiac and Pulmonary Rehab  Date  10/27/16  Educator  SB  Instruction Review Code  2- meets goals/outcomes      Falls Prevention: - Provides verbal and written material to individual with discussion of falls prevention and safety.   Cardiac Rehab from 10/27/2016 in [facility] Cardiac and Pulmonary Rehab  Date  10/27/16  Educator  SB  Instruction Review Code  2- meets goals/outcomes      Diabetes: - Individual verbal and written instruction to review signs/symptoms of diabetes, desired ranges of glucose level fasting, after meals and with exercise. Advice that pre and post exercise glucose checks will be done for 3 sessions at entry of program.    Knowledge Questionnaire Score:     Knowledge Questionnaire Score - 10/27/16 1343      Knowledge Questionnaire Score   Pre Score 24/28  Reviewed correct responses with Ron, He verbalized understanding of the reponses and had no questions      Core Components/Risk Factors/Patient Goals at Admission:     Personal Goals and Risk Factors at Admission - 10/27/16 1332      Core Components/Risk Factors/Patient Goals on Admission    Weight Management Yes;Weight Loss;Obesity   Intervention Weight Management: Develop a combined nutrition and exercise program designed to reach desired caloric intake,  while maintaining appropriate intake of nutrient and fiber, sodium and fats,  and appropriate energy expenditure required for the weight goal.;Weight Management: Provide education and appropriate resources to help participant work on and attain dietary goals.;Weight Management/Obesity: Establish reasonable short term and long term weight goals.;Obesity: Provide education and appropriate resources to help participant work on and attain dietary goals.   Admit Weight 195 lb 3.2 oz (88.5 kg)   Goal Weight: Short Term 193 lb (87.5 kg)   Goal Weight: Long Term 174 lb (78.9 kg)   Expected Outcomes Short Term: Continue to assess and modify interventions until short term weight is achieved;Long Term: Adherence to nutrition and physical activity/exercise program aimed toward attainment of established weight goal;Weight Loss: Understanding of general recommendations for a balanced deficit meal plan, which promotes 1-2 lb weight loss per week and includes a negative energy balance of [phone] kcal/d;Understanding recommendations for meals to include 15-35% energy as protein, 25-35% energy from fat, 35-60% energy from carbohydrates, less than 200mg  of dietary cholesterol, 20-35 gm of total fiber daily;Understanding of distribution of calorie intake throughout the day with the consumption of 4-5 meals/snacks   Diabetes --  Erroneous charting: Does not have diabetes   Intervention Provide education about signs/symptoms and action to take for hypo/hyperglycemia.;Provide education about proper nutrition, including hydration, and aerobic/resistive exercise prescription along with prescribed medications to achieve blood glucose in normal ranges: Fasting glucose 65-99 mg/dL   Expected Outcomes Short Term: Participant verbalizes understanding of the signs/symptoms and immediate care of hyper/hypoglycemia, proper foot care and importance of medication, aerobic/resistive exercise and nutrition plan for blood glucose control.;Long  Term: Attainment of HbA1C < 7%.   Heart Failure Yes   Intervention Provide a combined exercise and nutrition program that is supplemented with education, support and counseling about heart failure. Directed toward relieving symptoms such as shortness of breath, decreased exercise tolerance, and extremity edema.   Expected Outcomes Improve functional capacity of life;Short term: Attendance in program 2-3 days a week with increased exercise capacity. Reported lower sodium intake. Reported increased fruit and vegetable intake. Reports medication compliance.;Short term: Daily weights obtained and reported for increase. Utilizing diuretic protocols set by physician.;Long term: Adoption of self-care skills and reduction of barriers for early signs and symptoms recognition and intervention leading to self-care maintenance.   Hypertension Yes   Intervention Provide education on lifestyle modifcations including regular physical activity/exercise, weight management, moderate sodium restriction and increased consumption of fresh fruit, vegetables, and low fat dairy, alcohol moderation, and smoking cessation.;Monitor prescription use compliance.   Expected Outcomes Short Term: Continued assessment and intervention until BP is < 140/55mm HG in hypertensive participants. < 130/25mm HG in hypertensive participants with diabetes, heart failure or chronic kidney disease.;Long Term: Maintenance of blood pressure at goal levels.   Lipids Yes   Intervention Provide education and support for participant on nutrition & aerobic/resistive exercise along with prescribed medications to achieve LDL 70mg , HDL >40mg .   Expected Outcomes Short Term: Participant states understanding of desired cholesterol values and is compliant with medications prescribed. Participant is following exercise prescription and nutrition guidelines.;Long Term: Cholesterol controlled with medications as prescribed, with individualized exercise RX and with  personalized nutrition plan. Value goals: LDL < 70mg , HDL > 40 mg.   Stress Yes  Fianacial and family. "Wife spends monet when she shouldn't"  and she has chronic illness, neither can cook meals at home, so have to pick up meals. Unable to do what he wants without needing to stop and rest. Wants to be able to do more .    Intervention Offer  individual and/or small group education and counseling on adjustment to heart disease, stress management and health-related lifestyle change. Teach and support self-help strategies.;Refer participants experiencing significant psychosocial distress to appropriate mental health specialists for further evaluation and treatment. When possible, include family members and significant others in education/counseling sessions.   Expected Outcomes Short Term: Participant demonstrates changes in health-related behavior, relaxation and other stress management skills, ability to obtain effective social support, and compliance with psychotropic medications if prescribed.;Long Term: Emotional wellbeing is indicated by absence of clinically significant psychosocial distress or social isolation.      Core Components/Risk Factors/Patient Goals Review:    Core Components/Risk Factors/Patient Goals at Discharge (Final Review):    ITP Comments:     ITP Comments    Row Name 10/27/16 1323           ITP Comments Medical review completed. Initial ITP created.  DIagnosis documentation can be found in [facility]. Aw[facility] note for Cardiac Rehab          Comments: Initial ITP

## 2016-10-28 ENCOUNTER — Encounter: Payer: Non-veteran care | Admitting: *Deleted

## 2016-10-28 DIAGNOSIS — I519 Heart disease, unspecified: Secondary | ICD-10-CM | POA: Diagnosis not present

## 2016-10-28 DIAGNOSIS — I5022 Chronic systolic (congestive) heart failure: Secondary | ICD-10-CM

## 2016-10-28 NOTE — Progress Notes (Signed)
Daily Session Note  Patient Details  Name: CHIEF WALKUP MRN: [id number] Date of Birth: 01-Sep-1945 Referring Provider:     Cardiac Rehab from 10/27/2016 in [facility] Cardiac and Pulmonary Rehab  Referring Provider  [facility]      Encounter Date: 10/28/2016  Check In:     Session Check In - 10/28/16 7505      Check-In   Location [facility]-Cardiac & Pulmonary Rehab   Staff Present Heath Lark, RN, BSN, Gordy Councilman, MA, ACSM RCEP, Exercise Physiologist;Laureen Janell Quiet, RRT, Respiratory Therapist   Supervising physician immediately available to respond to emergencies See telemetry face sheet for immediately available ER MD   Medication changes reported     No   Fall or balance concerns reported    No   Warm-up and Cool-down Performed on first and last piece of equipment   Resistance Training Performed Yes   VAD Patient? No     Pain Assessment   Currently in Pain? No/denies   Multiple Pain Sites No           Exercise Prescription Changes - 10/27/16 1300      Response to Exercise   Blood Pressure (Admit) 154/66   Blood Pressure (Exercise) 138/74   Blood Pressure (Exit) 132/60   Heart Rate (Admit) 71 bpm   Heart Rate (Exercise) 88 bpm   Heart Rate (Exit) 75 bpm   Oxygen Saturation (Admit) 96 %   Oxygen Saturation (Exercise) 97 %   Rating of Perceived Exertion (Exercise) 14   Perceived Dyspnea (Exercise) 1   Symptoms hip pain (9/10), slightly SOB, using cane, feels wobbly   Comments walk test results      History  Smoking Status  . Former Smoker  . Packs/day: 1.00  . Years: 50.00  . Types: Cigarettes  . Quit date: 05/05/2002  Smokeless Tobacco  . Never Used    Goals Met:  Exercise tolerated well Personal goals reviewed No report of cardiac concerns or symptoms Strength training completed today  Goals Unmet:  Not Applicable  Comments: First full day of exercise!  Patient was oriented to gym and equipment including functions, settings, policies, and  procedures.  Patient's individual exercise prescription and treatment plan were reviewed.  All starting workloads were established based on the results of the 6 minute walk test done at initial orientation visit.  The plan for exercise progression was also introduced and progression will be customized based on patient's performance and goals.    Dr. Emily Filbert is Medical Director for [place] and [facility].

## 2016-10-30 ENCOUNTER — Encounter: Payer: Non-veteran care | Admitting: *Deleted

## 2016-10-30 DIAGNOSIS — I5022 Chronic systolic (congestive) heart failure: Secondary | ICD-10-CM

## 2016-10-30 DIAGNOSIS — I519 Heart disease, unspecified: Secondary | ICD-10-CM | POA: Diagnosis not present

## 2016-10-30 NOTE — Progress Notes (Signed)
Daily Session Note  Patient Details  Name: Andrew [name] MRN: [id number] Date of Birth: 11/03/1945 Referring Provider:     Cardiac Rehab from 10/27/2016 in [facility] Cardiac and Pulmonary Rehab  Referring Provider  [facility]      Encounter Date: 10/30/2016  Check In:     Session Check In - 10/30/16 0913      Check-In   Location [facility]-Cardiac & Pulmonary Rehab   Staff Present Mary Jo Abernethy, RN, BSN, MA; , RN BSN;Jessica Hawkins, MA, ACSM RCEP, Exercise Physiologist   Supervising physician immediately available to respond to emergencies See telemetry face sheet for immediately available ER MD   Medication changes reported     No   Fall or balance concerns reported    No   Warm-up and Cool-down Performed on first and last piece of equipment   Resistance Training Performed Yes   VAD Patient? No     Pain Assessment   Currently in Pain? No/denies         History  Smoking Status  . Former Smoker  . Packs/day: 1.00  . Years: 50.00  . Types: Cigarettes  . Quit date: 05/05/2002  Smokeless Tobacco  . Never Used    Goals Met:  Proper associated with RPD/PD & O2 Sat Independence with exercise equipment Exercise tolerated well No report of cardiac concerns or symptoms Strength training completed today  Goals Unmet:  Not Applicable  Comments: Pt able to follow exercise prescription today without complaint.  Will continue to monitor for progression.    Dr. Mark Miller is Medical Director for [facility] and [facility]. 

## 2016-11-04 ENCOUNTER — Encounter: Payer: Non-veteran care | Attending: Internal Medicine | Admitting: *Deleted

## 2016-11-04 DIAGNOSIS — I519 Heart disease, unspecified: Secondary | ICD-10-CM | POA: Diagnosis not present

## 2016-11-04 DIAGNOSIS — I5022 Chronic systolic (congestive) heart failure: Secondary | ICD-10-CM

## 2016-11-04 NOTE — Progress Notes (Signed)
Daily Session Note  Patient Details  Name: Andrew [name] MRN: [id number] Date of Birth: 11/18/1945 Referring Provider:     Cardiac Rehab from 10/27/2016 in [facility] Cardiac and Pulmonary Rehab  Referring Provider  [facility]      Encounter Date: 11/04/2016  Check In:     Session Check In - 11/04/16 0820      Check-In   Location [facility]-Cardiac & Pulmonary Rehab   Staff Present Heath Lark, RN, BSN, CCRP;Jessica Canton, MA, ACSM RCEP, Exercise Physiologist;Amanda Oletta Darter, [place], ACSM CEP, Exercise Physiologist   Supervising physician immediately available to respond to emergencies See telemetry face sheet for immediately available ER MD   Medication changes reported     No   Fall or balance concerns reported    No   Warm-up and Cool-down Performed on first and last piece of equipment   Resistance Training Performed Yes   VAD Patient? No     Pain Assessment   Currently in Pain? No/denies   Multiple Pain Sites No           Exercise Prescription Changes - 11/04/16 1000      Home Exercise Plan   Plans to continue exercise at Home (comment)  walk   Frequency Add 2 additional days to program exercise sessions.   Initial Home Exercises Provided 11/04/16      History  Smoking Status  . Former Smoker  . Packs/day: 1.00  . Years: 50.00  . Types: Cigarettes  . Quit date: 05/05/2002  Smokeless Tobacco  . Never Used    Goals Met:  Independence with exercise equipment Exercise tolerated well No report of cardiac concerns or symptoms Strength training completed today  Goals Unmet:  Not Applicable  Comments: Reviewed home exercise with pt today.  Pt plans to walk for exercise.  Reviewed THR, pulse, RPE, sign and symptoms, NTG use, and when to call 911 or MD.  Also discussed weather considerations and indoor options.  Pt voiced understanding.    Dr. Emily Filbert is Medical Director for [place] and [facility].

## 2016-11-06 ENCOUNTER — Encounter: Payer: Non-veteran care | Admitting: *Deleted

## 2016-11-06 DIAGNOSIS — I5022 Chronic systolic (congestive) heart failure: Secondary | ICD-10-CM

## 2016-11-06 DIAGNOSIS — I519 Heart disease, unspecified: Secondary | ICD-10-CM | POA: Diagnosis not present

## 2016-11-06 NOTE — Progress Notes (Signed)
Daily Session Note  Patient Details  Name: Andrew [name] MRN: [id number] Date of Birth: 09-02-1945 Referring Provider:     Cardiac Rehab from 10/27/2016 in [facility] Cardiac and Pulmonary Rehab  Referring Provider  [facility]      Encounter Date: 11/06/2016  Check In:     Session Check In - 11/06/16 9471      Check-In   Location [facility]-Cardiac & Pulmonary Rehab   Staff Present Alberteen Sam, MA, ACSM RCEP, Exercise Physiologist;Amanda Oletta Darter, BA, ACSM CEP, Exercise Physiologist;Meredith Sherryll Burger, RN BSN   Supervising physician immediately available to respond to emergencies See telemetry face sheet for immediately available ER MD   Medication changes reported     No   Fall or balance concerns reported    No   Warm-up and Cool-down Performed on first and last piece of equipment   Resistance Training Performed Yes   VAD Patient? No     Pain Assessment   Currently in Pain? No/denies   Multiple Pain Sites No         History  Smoking Status  . Former Smoker  . Packs/day: 1.00  . Years: 50.00  . Types: Cigarettes  . Quit date: 05/05/2002  Smokeless Tobacco  . Never Used    Goals Met:  Independence with exercise equipment Exercise tolerated well No report of cardiac concerns or symptoms Strength training completed today  Goals Unmet:  Not Applicable  Comments: Pt able to follow exercise prescription today without complaint.  Will continue to monitor for progression.    Dr. Emily Filbert is Medical Director for [place] and [facility].

## 2016-11-11 DIAGNOSIS — I519 Heart disease, unspecified: Secondary | ICD-10-CM | POA: Diagnosis not present

## 2016-11-11 DIAGNOSIS — I5022 Chronic systolic (congestive) heart failure: Secondary | ICD-10-CM

## 2016-11-11 NOTE — Progress Notes (Signed)
Daily Session Note  Patient Details  Name: Andrew [name] MRN: [id number] Date of Birth: 06-26-1945 Referring Provider:     Cardiac Rehab from 10/27/2016 in [facility] Cardiac and Pulmonary Rehab  Referring Provider  [facility]      Encounter Date: 11/11/2016  Check In:     Session Check In - 11/11/16 0927      Check-In   Location [facility]-Cardiac & Pulmonary Rehab   Staff Present Heath Lark, RN, BSN, Lance Sell, BA, ACSM CEP, Exercise Physiologist;Krista Frederico Hamman, RN BSN   Supervising physician immediately available to respond to emergencies See telemetry face sheet for immediately available ER MD   Medication changes reported     No   Fall or balance concerns reported    No   Warm-up and Cool-down Performed on first and last piece of equipment   Resistance Training Performed Yes   VAD Patient? No     Pain Assessment   Currently in Pain? No/denies         History  Smoking Status  . Former Smoker  . Packs/day: 1.00  . Years: 50.00  . Types: Cigarettes  . Quit date: 05/05/2002  Smokeless Tobacco  . Never Used    Goals Met:  Independence with exercise equipment Exercise tolerated well No report of cardiac concerns or symptoms Strength training completed today  Goals Unmet:  Not Applicable  Comments: Pt able to follow exercise prescription today without complaint.  Will continue to monitor for progression.    Dr. Emily Filbert is Medical Director for [place] and [facility].

## 2016-11-13 ENCOUNTER — Encounter: Payer: Non-veteran care | Admitting: *Deleted

## 2016-11-13 DIAGNOSIS — I5022 Chronic systolic (congestive) heart failure: Secondary | ICD-10-CM

## 2016-11-13 DIAGNOSIS — I519 Heart disease, unspecified: Secondary | ICD-10-CM | POA: Diagnosis not present

## 2016-11-13 NOTE — Progress Notes (Signed)
Daily Session Note  Patient Details  Name: Andrew [name] MRN: [id number] Date of Birth: 1946-01-07 Referring Provider:     Cardiac Rehab from 10/27/2016 in [facility] Cardiac and Pulmonary Rehab  Referring Provider  [facility]      Encounter Date: 11/13/2016  Check In:     Session Check In - 11/13/16 0823      Check-In   Location [facility]-Cardiac & Pulmonary Rehab   Staff Present Nyoka Cowden, RN, BSN, MA;Maris Bena Sherryll Burger, RN Vickki Hearing, BA, ACSM CEP, Exercise Physiologist   Supervising physician immediately available to respond to emergencies See telemetry face sheet for immediately available ER MD   Medication changes reported     No   Fall or balance concerns reported    No   Warm-up and Cool-down Performed on first and last piece of equipment   Resistance Training Performed Yes   VAD Patient? No     Pain Assessment   Currently in Pain? No/denies           Exercise Prescription Changes - 11/12/16 1400      Response to Exercise   Blood Pressure (Admit) 112/66   Blood Pressure (Exercise) 136/72   Blood Pressure (Exit) 104/60   Heart Rate (Admit) 75 bpm   Heart Rate (Exercise) 107 bpm   Heart Rate (Exit) 73 bpm   Rating of Perceived Exertion (Exercise) 11   Symptoms none   Duration Progress to 45 minutes of aerobic exercise without signs/symptoms of physical distress   Intensity THRR unchanged     Progression   Progression Continue to progress workloads to maintain intensity without signs/symptoms of physical distress.   Average METs 3.65     Resistance Training   Training Prescription Yes   Weight 3 lb   Reps 10-15     Interval Training   Interval Training No     NuStep   Level 1   Minutes 15   METs 3.2     REL-XR   Level 1   Minutes 15   METs 4.1      History  Smoking Status  . Former Smoker  . Packs/day: 1.00  . Years: 50.00  . Types: Cigarettes  . Quit date: 05/05/2002  Smokeless Tobacco  . Never Used    Goals Met:  Proper  associated with RPD/PD & O2 Sat Independence with exercise equipment Exercise tolerated well No report of cardiac concerns or symptoms Strength training completed today  Goals Unmet:  Not Applicable  Comments: Pt able to follow exercise prescription today without complaint.  Will continue to monitor for progression.    Dr. Emily Filbert is Medical Director for [place] and [facility].

## 2016-11-18 ENCOUNTER — Encounter: Payer: Self-pay | Admitting: Dietician

## 2016-11-18 ENCOUNTER — Encounter: Payer: Non-veteran care | Admitting: *Deleted

## 2016-11-18 DIAGNOSIS — I519 Heart disease, unspecified: Secondary | ICD-10-CM | POA: Diagnosis not present

## 2016-11-18 DIAGNOSIS — I5022 Chronic systolic (congestive) heart failure: Secondary | ICD-10-CM

## 2016-11-18 NOTE — Progress Notes (Signed)
Daily Session Note  Patient Details  Name: CARVELL HOEFFNER MRN: [id number] Date of Birth: 12/06/1945 Referring Provider:     Cardiac Rehab from 10/27/2016 in [facility] Cardiac and Pulmonary Rehab  Referring Provider  [facility]      Encounter Date: 11/18/2016  Check In:     Session Check In - 11/18/16 0825      Check-In   Location [facility]-Cardiac & Pulmonary Rehab   Staff Present Heath Lark, RN, BSN, CCRP;Jessica Elim, MA, ACSM RCEP, Exercise Physiologist;Amanda Oletta Darter, [place], ACSM CEP, Exercise Physiologist   Supervising physician immediately available to respond to emergencies See telemetry face sheet for immediately available ER MD   Medication changes reported     No   Fall or balance concerns reported    No   Warm-up and Cool-down Performed on first and last piece of equipment   Resistance Training Performed Yes   VAD Patient? No     Pain Assessment   Currently in Pain? No/denies   Multiple Pain Sites No         History  Smoking Status  . Former Smoker  . Packs/day: 1.00  . Years: 50.00  . Types: Cigarettes  . Quit date: 05/05/2002  Smokeless Tobacco  . Never Used    Goals Met:  Independence with exercise equipment Exercise tolerated well No report of cardiac concerns or symptoms Strength training completed today  Goals Unmet:  Not Applicable  Comments: Pt able to follow exercise prescription today without complaint.  Will continue to monitor for progression.    Dr. Emily Filbert is Medical Director for [place] and [facility].

## 2016-11-19 ENCOUNTER — Encounter: Payer: Self-pay | Admitting: *Deleted

## 2016-11-19 DIAGNOSIS — I5022 Chronic systolic (congestive) heart failure: Secondary | ICD-10-CM

## 2016-11-19 NOTE — Progress Notes (Signed)
Cardiac Individual Treatment Plan  Patient Details  Name: Andrew [name] MRN: [id number] Date of Birth: 1946/02/05 Referring Provider:     Cardiac Rehab from 10/27/2016 in [facility] Cardiac and Pulmonary Rehab  Referring Provider  [facility]      Initial Encounter Date:    Cardiac Rehab from 10/27/2016 in [facility] Cardiac and Pulmonary Rehab  Date  10/27/16  Referring Provider  [facility]      Visit Diagnosis: Heart failure, chronic systolic ([place])  Patient's Home Medications on Admission:  Current Outpatient Prescriptions:  .  acetaminophen (TYLENOL) 325 MG tablet, Take 975 mg by mouth 3 (three) times daily. , Disp: , Rfl:  .  aspirin EC 81 MG tablet, Take 81 mg by mouth at bedtime., Disp: , Rfl:  .  carvedilol (COREG) 12.5 MG tablet, Take 12.5 mg by mouth 2 (two) times daily with a meal.  , Disp: , Rfl:  .  cefUROXime (CEFTIN) 500 MG tablet, Take 1 tablet (500 mg total) by mouth 2 (two) times daily with a meal., Disp: 4 tablet, Rfl: 0 .  citalopram (CELEXA) 20 MG tablet, Take 20 mg by mouth at bedtime., Disp: , Rfl:  .  clopidogrel (PLAVIX) 75 MG tablet, Take 75 mg by mouth at bedtime., Disp: , Rfl:  .  finasteride (PROSCAR) 5 MG tablet, Take 5 mg by mouth daily., Disp: , Rfl:  .  fluconazole (DIFLUCAN) 200 MG tablet, Take 200 mg by mouth daily., Disp: , Rfl:  .  furosemide (LASIX) 20 MG tablet, Take 20 mg by mouth at bedtime., Disp: , Rfl:  .  gabapentin (NEURONTIN) 400 MG capsule, Take 1,200 mg by mouth 3 (three) times daily., Disp: , Rfl:  .  hydrocerin (EUCERIN) CREA, Apply 1 application topically 2 (two) times daily as needed (for irritation)., Disp: , Rfl:  .  hydrocortisone 2.5 % ointment, Apply 1 application topically 2 (two) times daily as needed (for itching)., Disp: , Rfl:  .  isosorbide mononitrate (IMDUR) 30 MG 24 hr tablet, Take 30 mg by mouth daily as needed (for increased BP)., Disp: , Rfl:  .  lisinopril (PRINIVIL,ZESTRIL) 10 MG tablet, Take 10 mg by mouth daily., Disp: ,  Rfl:  .  Menthol-Methyl Salicylate (THERA-GESIC EX), Apply 1 application topically 3 (three) times daily as needed (for pain)., Disp: , Rfl:  .  methocarbamol (ROBAXIN) 750 MG tablet, Take 750 mg by mouth 2 (two) times daily as needed for muscle spasms., Disp: , Rfl:  .  metoprolol succinate (TOPROL-XL) 25 MG 24 hr tablet, Take 25 mg by mouth daily., Disp: , Rfl:  .  mexiletine (MEXITIL) 250 MG capsule, Take 250 mg by mouth 2 (two) times daily.  , Disp: , Rfl:  .  niacin 500 MG tablet, Take 500 mg by mouth at bedtime., Disp: , Rfl:  .  nitroGLYCERIN (NITROSTAT) 0.4 MG SL tablet, Place 0.4 mg under the tongue every 5 (five) minutes as needed for chest pain. , Disp: , Rfl:  .  Omega-3 Fatty Acids (FISH OIL) 1000 MG CAPS, Take 1,000 mg by mouth at bedtime., Disp: , Rfl:  .  omeprazole (PRILOSEC) 20 MG capsule, Take 40 mg by mouth 2 (two) times daily before a meal., Disp: , Rfl:  .  quiniDINE gluconate 324 MG CR tablet, Take 324 mg by mouth 2 (two) times daily., Disp: , Rfl:  .  simvastatin (ZOCOR) 40 MG tablet, Take 40 mg by mouth at bedtime., Disp: , Rfl:  .  Tamsulosin HCl (  FLOMAX) 0.4 MG CAPS, Take 0.4 mg by mouth 2 (two) times daily. , Disp: , Rfl:   Past Medical History: Past Medical History:  Diagnosis Date  . Atrial fibrillation ([place])   . CAD (coronary artery disease)    a. s/p CABG 1997;  b. s/p PCI to [facility] 2006;  c. s/p BMS x 3 to S-RCA 10/10;  d.  Myoview 2010: EF 24%, basal inf, mid inf. apical inf, basal inf-lat and mid IL scar, no isch;  e. cath 1/11: S-RCA occluded, S-OM chronically occluded, L-LAD ok with L-R collaterals (med. Tx)  . HLD (hyperlipidemia)   . HTN (hypertension)   . Ischemic cardiomyopathy    a. EF 25-35%;   b. echo 3/11: EF 35%, mild LVH, severe inf/post HK, lat and apical HK, grade 1 diast dysfxn, mild AI, mild to mod MR, PASP 32  . Systolic CHF, chronic ([place])   . Ventricular tachycardia ([place])    a. s/p AICD 2006;   b. Amio intolerant due to liver and thyroid  issues;   c. 2/12:  quinidine added to mexilitine  (consider Tikosyn in future??)    Tobacco Use: History  Smoking Status  . Former Smoker  . Packs/day: 1.00  . Years: 50.00  . Types: Cigarettes  . Quit date: 05/05/2002  Smokeless Tobacco  . Never Used    Labs: Recent Review Flowsheet Data    Labs for ITP Cardiac and Pulmonary Rehab Latest Ref Rng & Units 05/06/2009 05/07/2009 10/15/2009 01/08/2010 05/29/2010   Cholestrol 0 - 200 mg/dL - 129 ATP III CLASSIFICATION: <200     mg/dL   Desirable 200-239  mg/dL   Borderline High >=240    mg/dL   High 253(H) 106 -   LDLCALC 0 - 99 mg/dL - 52 Total Cholesterol/HDL:CHD Risk Coronary Heart Disease Risk Table Men   Women 1/2 Average Risk   3.4   3.3 Average Risk       5.0   4.4 2 X Average Risk   9.6   7.1 3 X Average Risk  23.4   11.0 Use the calculated Patient Ratio above and the CHD Risk Table to determine the patient's CHD Risk. ATP III CLASSIFICATION (LDL): <100     mg/dL   Optimal 100-129  mg/dL   Near or Above Optimal 130-159  mg/dL   Borderline 160-189  mg/dL   High >190     mg/dL   Very High 156(H) 33 -   HDL >39 mg/dL - 38(L) 34(L) 35(L) -   Trlycerides <150 mg/dL - 194(H) 317(H) 188(H) -   Hemoglobin A1c 4.6 - 6.1 % 6.2 (NOTE) The ADA recommends the following therapeutic goal for glycemic control related to Hgb A1c measurement: Goal of therapy: <6.5 Hgb A1c  Reference: American Diabetes Association: Clinical Practice Recommendations 2010, Diabetes Care, 2010, 33: (Suppl 1).(H) - - - -   TCO2 0 - 100 mmol/L - - - - 29       Exercise Target Goals:    Exercise Program Goal: Individual exercise prescription set with THRR, safety & activity barriers. Participant demonstrates ability to understand and report RPE using BORG scale, to self-measure pulse accurately, and to acknowledge the importance of the exercise prescription.  Exercise Prescription Goal: Starting with aerobic activity 30 plus minutes a day, 3 days per week  for initial exercise prescription. Provide home exercise prescription and guidelines that participant acknowledges understanding prior to discharge.  Activity Barriers & Risk Stratification:     Activity Barriers &  Cardiac Risk Stratification - 10/27/16 1054      Activity Barriers & Cardiac Risk Stratification   Activity Barriers Arthritis;Joint Problems;Shortness of Breath;Back Problems;Neck/Spine Problems;Muscular Weakness;History of Falls;Balance Concerns;Assistive Device;Deconditioning  bilateral hip pain, neck pain occasionally, compressed disc L5-S1   Cardiac Risk Stratification High      6 Minute Walk:     6 Minute Walk    Row Name 10/27/16 1448         6 Minute Walk   Phase Initial     Distance 955 feet     Walk Time 5.75 minutes     # of Rest Breaks 1  15 sec     MPH 1.88     METS 2     RPE 14     Perceived Dyspnea  1     VO2 Peak 6.99     Symptoms Yes (comment)     Comments using cane, feels wobbly, hip pain 9/10, slightly SOB     Resting HR 71 bpm     Resting BP 154/66     Max Ex. HR 89 bpm     Max Ex. BP 138/74     2 Minute Post BP 132/60        Oxygen Initial Assessment:   Oxygen Re-Evaluation:   Oxygen Discharge (Final Oxygen Re-Evaluation):   Initial Exercise Prescription:     Initial Exercise Prescription - 10/27/16 1400      Date of Initial Exercise RX and Referring Provider   Date 10/27/16   Referring Provider [facility]     Treadmill   MPH 1.3   Grade 0   Minutes 15   METs 2     NuStep   Level 1   SPM 80   Minutes 15   METs 2     REL-XR   Level 1   Speed 50   Minutes 15   METs 2     Prescription Details   Frequency (times per week) 2   Duration Progress to 45 minutes of aerobic exercise without signs/symptoms of physical distress     Intensity   THRR 40-80% of Max Heartrate 102-133   Ratings of Perceived Exertion 11-13   Perceived Dyspnea 0-4     Progression   Progression Continue to progress workloads to  maintain intensity without signs/symptoms of physical distress.     Resistance Training   Training Prescription Yes   Weight 3 lbs      Perform Capillary Blood Glucose checks as needed.  Exercise Prescription Changes:     Exercise Prescription Changes    Row Name 10/27/16 1300 10/28/16 1300 11/04/16 1000 11/12/16 1400       Response to Exercise   Blood Pressure (Admit) 154/66 134/70  - 112/66    Blood Pressure (Exercise) 138/74 130/66  - 136/72    Blood Pressure (Exit) 132/60 98/60  - 104/60    Heart Rate (Admit) 71 bpm 77 bpm  - 75 bpm    Heart Rate (Exercise) 88 bpm 99 bpm  - 107 bpm    Heart Rate (Exit) 75 bpm 73 bpm  - 73 bpm    Oxygen Saturation (Admit) 96 %  -  -  -    Oxygen Saturation (Exercise) 97 %  -  -  -    Rating of Perceived Exertion (Exercise) 14 13  - 11    Perceived Dyspnea (Exercise) 1  -  -  -    Symptoms hip pain (9/10), slightly  SOB, using cane, feels wobbly none  - none    Comments walk test results first full day of exercise  -  -    Duration  - Progress to 45 minutes of aerobic exercise without signs/symptoms of physical distress  - Progress to 45 minutes of aerobic exercise without signs/symptoms of physical distress    Intensity  - THRR unchanged  - THRR unchanged      Progression   Progression  - Continue to progress workloads to maintain intensity without signs/symptoms of physical distress.  - Continue to progress workloads to maintain intensity without signs/symptoms of physical distress.    Average METs  - 2.25  - 3.65      Resistance Training   Training Prescription  - Yes  - Yes    Weight  - 3 lbs  - 3 lb    Reps  - 10-15  - 10-15      Interval Training   Interval Training  - No  - No      NuStep   Level  - 1  - 1    Minutes  - 15  - 15    METs  - 1.9  - 3.2      REL-XR   Level  - 1  - 1    Speed  - 50  -  -    Minutes  - 15  - 15    METs  - 2.6  - 4.1      Home Exercise Plan   Plans to continue exercise at  -  - Home  (comment)  walk  -    Frequency  -  - Add 2 additional days to program exercise sessions.  -    Initial Home Exercises Provided  -  - 11/04/16  -       Exercise Comments:     Exercise Comments    Row Name 10/28/16 1048           Exercise Comments First full day of exercise!  Patient was oriented to gym and equipment including functions, settings, policies, and procedures.  Patient's individual exercise prescription and treatment plan were reviewed.  All starting workloads were established based on the results of the 6 minute walk test done at initial orientation visit.  The plan for exercise progression was also introduced and progression will be customized based on patient's performance and goals.          Exercise Goals and Review:     Exercise Goals    Row Name 10/27/16 1452             Exercise Goals   Increase Physical Activity Yes       Intervention Provide advice, education, support and counseling about physical activity/exercise needs.;Develop an individualized exercise prescription for aerobic and resistive training based on initial evaluation findings, risk stratification, comorbidities and participant's personal goals.       Expected Outcomes Achievement of increased cardiorespiratory fitness and enhanced flexibility, muscular endurance and strength shown through measurements of functional capacity and personal statement of participant.       Increase Strength and Stamina Yes       Intervention Provide advice, education, support and counseling about physical activity/exercise needs.;Develop an individualized exercise prescription for aerobic and resistive training based on initial evaluation findings, risk stratification, comorbidities and participant's personal goals.       Expected Outcomes Achievement of increased cardiorespiratory fitness and enhanced flexibility, muscular endurance and strength shown  through measurements of functional capacity and personal  statement of participant.          Exercise Goals Re-Evaluation :     Exercise Goals Re-Evaluation    Row Name 10/28/16 1311 11/04/16 1018 11/12/16 1448         Exercise Goal Re-Evaluation   Exercise Goals Review Increase Physical Activity;Increase Strenth and Stamina Increase Physical Activity;Increase Strenth and Stamina Increase Physical Activity;Increase Strenth and Stamina     Comments Ron completed his first day of exercise today!  He did well on his thirty minutes of exericse. We will continue to monitor his progression.  Reviewed home exercise with pt today.  Pt plans to walk for exercise.  Reviewed THR, pulse, RPE, sign and symptoms, NTG use, and when to call 911 or MD.  Also discussed weather considerations and indoor options.  Pt voiced understanding. Ron is progressing well with exercise and has increased his average MET level.     Expected Outcomes Short and Long: Continue to come to classes to work on increasing physical activity. Short - begin home exercise Long - Continue to exercise independently Short - Ron willl attend Cardiac Rehab regularly and continue to progress.  Long - Ron will coontinue to exercise independently.        Discharge Exercise Prescription (Final Exercise Prescription Changes):     Exercise Prescription Changes - 11/12/16 1400      Response to Exercise   Blood Pressure (Admit) 112/66   Blood Pressure (Exercise) 136/72   Blood Pressure (Exit) 104/60   Heart Rate (Admit) 75 bpm   Heart Rate (Exercise) 107 bpm   Heart Rate (Exit) 73 bpm   Rating of Perceived Exertion (Exercise) 11   Symptoms none   Duration Progress to 45 minutes of aerobic exercise without signs/symptoms of physical distress   Intensity THRR unchanged     Progression   Progression Continue to progress workloads to maintain intensity without signs/symptoms of physical distress.   Average METs 3.65     Resistance Training   Training Prescription Yes   Weight 3 lb   Reps  10-15     Interval Training   Interval Training No     NuStep   Level 1   Minutes 15   METs 3.2     REL-XR   Level 1   Minutes 15   METs 4.1      Nutrition:  Target Goals: Understanding of nutrition guidelines, daily intake of sodium '1500mg'$ , cholesterol '200mg'$ , calories 30% from fat and 7% or less from saturated fats, daily to have 5 or more servings of fruits and vegetables.  Biometrics:     Pre Biometrics - 10/27/16 1452      Pre Biometrics   Height 5' 6.9" (1.699 m)   Weight 195 lb 3.2 oz (88.5 kg)   Waist Circumference 40 inches   Hip Circumference 40.25 inches   Waist to Hip Ratio 0.99 %   BMI (Calculated) 30.7   Single Leg Stand 22.34 seconds       Nutrition Therapy Plan and Nutrition Goals:     Nutrition Therapy & Goals - 11/18/16 1159      Nutrition Therapy   Diet TLC   Protein (specify units) 80grams daily   Saturated Fats 13 max. grams   Sodium 1500 grams     Personal Nutrition Goals   Nutrition Goal Eat meals at home more often, to save both calories and money   Personal Goal #2 increase vegetables, eat  2 times a day with meals   Personal Goal #3 Decrease sugar-sweetened drinks -- sodas, sweet tea, ?lemonade   Comments Mr. Smoak is unable to stand for long periods of time to cook, his wife cannot cook due to disability, so they eat out often. Discussed basic meal planning and options for easy balanced meals. Discussed importance of controlling sugar intake with elevated HbA1C, advised reducing sugar intake especially from beverages, he agrees. He would like to lose weight, and set goals to decrease calories from restaurant meals and sugary drinks.     Intervention Plan   Intervention Prescribe, educate and counsel regarding individualized specific dietary modifications aiming towards targeted core components such as weight, hypertension, lipid management, diabetes, heart failure and other comorbidities.;Nutrition handout(s) given to patient.    Expected Outcomes Short Term Goal: Understand basic principles of dietary content, such as calories, fat, sodium, cholesterol and nutrients.;Short Term Goal: A plan has been developed with personal nutrition goals set during dietitian appointment.;Long Term Goal: Adherence to prescribed nutrition plan.      Nutrition Discharge: Rate Your Plate Scores:     Nutrition Assessments - 10/27/16 1331      MEDFICTS Scores   Pre Score 48      Nutrition Goals Re-Evaluation:   Nutrition Goals Discharge (Final Nutrition Goals Re-Evaluation):   Psychosocial: Target Goals: Acknowledge presence or absence of significant depression and/or stress, maximize coping skills, provide positive support system. Participant is able to verbalize types and ability to use techniques and skills needed for reducing stress and depression.   Initial Review & Psychosocial Screening:     Initial Psych Review & Screening - 10/27/16 1338      Initial Review   Source of Stress Concerns Chronic Illness;Family;Financial;Unable to perform yard/household activities   Comments Stress with wife spending money , stress with inablility to perform chores without stopping and resting all the time.       Quality of Life Scores:      Quality of Life - 10/27/16 1048      Quality of Life Scores   Health/Function Pre 14.6 %   Socioeconomic Pre 24.58 %   Psych/Spiritual Pre 22.29 %   Family Pre 16.8 %   GLOBAL Pre 18.38 %      PHQ-9: Recent Review Flowsheet Data    Depression screen [facility] 2/9 10/27/2016   Decreased Interest 1   Down, Depressed, Hopeless 0   PHQ - 2 Score 1   Altered sleeping 1   Tired, decreased energy 3   Change in appetite 1   Feeling bad or failure about yourself  0   Trouble concentrating 1   Moving slowly or fidgety/restless 0   Suicidal thoughts 0   PHQ-9 Score 7   Difficult doing work/chores Somewhat difficult     Interpretation of Total Score  Total Score Depression Severity:  1-4 =  Minimal depression, 5-9 = Mild depression, 10-14 = Moderate depression, 15-19 = Moderately severe depression, 20-27 = Severe depression   Psychosocial Evaluation and Intervention:     Psychosocial Evaluation - 10/28/16 0946      Psychosocial Evaluation & Interventions   Interventions Encouraged to exercise with the program and follow exercise prescription;Stress management education   Comments Counselor met with Mr. Mccollum today (Ron) for initial psychosocial evaluation.  He is a 71 year old who had a mild heart attack a few months ago and [facility].  He has a strong support  system with a spouse of 24 years and a daughter who lives close by.  Ron reports he sleeps well but not a great appetite.  He admits to a history fo depression in the past (undiagnosed) but states he is typically in a positive mood currently.  Ron has stress currently with his spouse's health (she is disabled); and finances due to his spouse's spending habits.  He has goals to lose weight; eat healthier; and be healthier while in this program.  Counselor and staff will continue to follow with Ron throughout the course of this program.     Expected Outcomes Ron will benefit from the psychoeducational components of this program for stress management and coping strategies.  He also will benefit from meeting with the dietician to learn more about healthier eating habits.  Ron needs to exercise consistently to achieve his stated goals.     Continue Psychosocial Services  Follow up required by staff      Psychosocial Re-Evaluation:   Psychosocial Discharge (Final Psychosocial Re-Evaluation):   Vocational Rehabilitation: Provide vocational rehab assistance to qualifying candidates.   Vocational Rehab Evaluation & Intervention:     Vocational Rehab - 10/27/16 1052      Initial Vocational Rehab Evaluation & Intervention   Assessment shows need for Vocational Rehabilitation No       Education: Education Goals: Education classes will be provided on a weekly basis, covering required topics. Participant will state understanding/return demonstration of topics presented.  Learning Barriers/Preferences:     Learning Barriers/Preferences - 10/27/16 1343      Learning Barriers/Preferences   Learning Barriers None   Learning Preferences Individual Instruction;Pictoral;Skilled Demonstration;Video      Education Topics: General Nutrition Guidelines/Fats and Fiber: -Group instruction provided by verbal, written material, models and posters to present the general guidelines for heart healthy nutrition. Gives an explanation and review of dietary fats and fiber.   Cardiac Rehab from 11/18/2016 in [facility] Cardiac and Pulmonary Rehab  Date  10/28/16  Educator  CR  Instruction Review Code  2- meets goals/outcomes      Controlling Sodium/Reading Food Labels: -Group verbal and written material supporting the discussion of sodium use in heart healthy nutrition. Review and explanation with models, verbal and written materials for utilization of the food label.   Cardiac Rehab from 11/18/2016 in [facility] Cardiac and Pulmonary Rehab  Date  11/04/16  Educator  PI  Instruction Review Code  2- meets goals/outcomes      Exercise Physiology & Risk Factors: - Group verbal and written instruction with models to review the exercise physiology of the cardiovascular system and associated critical values. Details cardiovascular disease risk factors and the goals associated with each risk factor.   Cardiac Rehab from 11/18/2016 in [facility] Cardiac and Pulmonary Rehab  Date  11/11/16  Educator  RG/AS  Instruction Review Code  2- meets goals/outcomes      Aerobic Exercise & Resistance Training: - Gives group verbal and written discussion on the health impact of inactivity. On the components of aerobic and resistive training programs and the benefits of this training and how to safely progress  through these programs.   Cardiac Rehab from 11/18/2016 in [facility] Cardiac and Pulmonary Rehab  Date  11/13/16  Educator  AS  Instruction Review Code  2- meets goals/outcomes      Flexibility, Balance, General Exercise Guidelines: - Provides group verbal and written instruction on the benefits of flexibility and balance training programs. Provides general exercise guidelines with specific guidelines to  those with heart or lung disease. Demonstration and skill practice provided.   Cardiac Rehab from 11/18/2016 in [facility] Cardiac and Pulmonary Rehab  Date  11/18/16  Educator  AS  Instruction Review Code  2- meets goals/outcomes      Stress Management: - Provides group verbal and written instruction about the health risks of elevated stress, cause of high stress, and healthy ways to reduce stress.   Depression: - Provides group verbal and written instruction on the correlation between heart/lung disease and depressed mood, treatment options, and the stigmas associated with seeking treatment.   Cardiac Rehab from 11/18/2016 in [facility] Cardiac and Pulmonary Rehab  Date  11/06/16  Educator  [facility]  Instruction Review Code  2- meets goals/outcomes      Anatomy & Physiology of the Heart: - Group verbal and written instruction and models provide basic cardiac anatomy and physiology, with the coronary electrical and arterial systems. Review of: AMI, Angina, Valve disease, Heart Failure, Cardiac Arrhythmia, Pacemakers, and the ICD.   Cardiac Procedures: - Group verbal and written instruction and models to describe the testing methods done to diagnose heart disease. Reviews the outcomes of the test results. Describes the treatment choices: Medical Management, Angioplasty, or Coronary Bypass Surgery.   Cardiac Medications: - Group verbal and written instruction to review commonly prescribed medications for heart disease. Reviews the medication, class of the drug, and side effects. Includes the steps to  properly store meds and maintain the prescription regimen.   Go Sex-Intimacy & Heart Disease, Get SMART - Goal Setting: - Group verbal and written instruction through game format to discuss heart disease and the return to sexual intimacy. Provides group verbal and written material to discuss and apply goal setting through the application of the S.M.A.R.T. Method.   Other Matters of the Heart: - Provides group verbal, written materials and models to describe Heart Failure, Angina, Valve Disease, and Diabetes in the realm of heart disease. Includes description of the disease process and treatment options available to the cardiac patient.   Exercise & Equipment Safety: - Individual verbal instruction and demonstration of equipment use and safety with use of the equipment.   Cardiac Rehab from 11/18/2016 in [facility] Cardiac and Pulmonary Rehab  Date  10/27/16  Educator  SB  Instruction Review Code  2- meets goals/outcomes      Infection Prevention: - Provides verbal and written material to individual with discussion of infection control including proper hand washing and proper equipment cleaning during exercise session.   Cardiac Rehab from 11/18/2016 in [facility] Cardiac and Pulmonary Rehab  Date  10/27/16  Educator  SB  Instruction Review Code  2- meets goals/outcomes      Falls Prevention: - Provides verbal and written material to individual with discussion of falls prevention and safety.   Cardiac Rehab from 11/18/2016 in [facility] Cardiac and Pulmonary Rehab  Date  10/27/16  Educator  SB  Instruction Review Code  2- meets goals/outcomes      Diabetes: - Individual verbal and written instruction to review signs/symptoms of diabetes, desired ranges of glucose level fasting, after meals and with exercise. Advice that pre and post exercise glucose checks will be done for 3 sessions at entry of program.    Knowledge Questionnaire Score:     Knowledge Questionnaire Score - 10/27/16 1343       Knowledge Questionnaire Score   Pre Score 24/28  Reviewed correct responses with Ron, He verbalized understanding of the reponses and had no questions  Core Components/Risk Factors/Patient Goals at Admission:     Personal Goals and Risk Factors at Admission - 10/27/16 1332      Core Components/Risk Factors/Patient Goals on Admission    Weight Management Yes;Weight Loss;Obesity   Intervention Weight Management: Develop a combined nutrition and exercise program designed to reach desired caloric intake, while maintaining appropriate intake of nutrient and fiber, sodium and fats, and appropriate energy expenditure required for the weight goal.;Weight Management: Provide education and appropriate resources to help participant work on and attain dietary goals.;Weight Management/Obesity: Establish reasonable short term and long term weight goals.;Obesity: Provide education and appropriate resources to help participant work on and attain dietary goals.   Admit Weight 195 lb 3.2 oz (88.5 kg)   Goal Weight: Short Term 193 lb (87.5 kg)   Goal Weight: Long Term 174 lb (78.9 kg)   Expected Outcomes Short Term: Continue to assess and modify interventions until short term weight is achieved;Long Term: Adherence to nutrition and physical activity/exercise program aimed toward attainment of established weight goal;Weight Loss: Understanding of general recommendations for a balanced deficit meal plan, which promotes 1-2 lb weight loss per week and includes a negative energy balance of [phone] kcal/d;Understanding recommendations for meals to include 15-35% energy as protein, 25-35% energy from fat, 35-60% energy from carbohydrates, less than '200mg'$  of dietary cholesterol, 20-35 gm of total fiber daily;Understanding of distribution of calorie intake throughout the day with the consumption of 4-5 meals/snacks   Diabetes --  Erroneous charting: Does not have diabetes   Intervention Provide education about  signs/symptoms and action to take for hypo/hyperglycemia.;Provide education about proper nutrition, including hydration, and aerobic/resistive exercise prescription along with prescribed medications to achieve blood glucose in normal ranges: Fasting glucose 65-99 mg/dL   Expected Outcomes Short Term: Participant verbalizes understanding of the signs/symptoms and immediate care of hyper/hypoglycemia, proper foot care and importance of medication, aerobic/resistive exercise and nutrition plan for blood glucose control.;Long Term: Attainment of HbA1C < 7%.   Heart Failure Yes   Intervention Provide a combined exercise and nutrition program that is supplemented with education, support and counseling about heart failure. Directed toward relieving symptoms such as shortness of breath, decreased exercise tolerance, and extremity edema.   Expected Outcomes Improve functional capacity of life;Short term: Attendance in program 2-3 days a week with increased exercise capacity. Reported lower sodium intake. Reported increased fruit and vegetable intake. Reports medication compliance.;Short term: Daily weights obtained and reported for increase. Utilizing diuretic protocols set by physician.;Long term: Adoption of self-care skills and reduction of barriers for early signs and symptoms recognition and intervention leading to self-care maintenance.   Hypertension Yes   Intervention Provide education on lifestyle modifcations including regular physical activity/exercise, weight management, moderate sodium restriction and increased consumption of fresh fruit, vegetables, and low fat dairy, alcohol moderation, and smoking cessation.;Monitor prescription use compliance.   Expected Outcomes Short Term: Continued assessment and intervention until BP is < 140/65m HG in hypertensive participants. < 130/814mHG in hypertensive participants with diabetes, heart failure or chronic kidney disease.;Long Term: Maintenance of blood  pressure at goal levels.   Lipids Yes   Intervention Provide education and support for participant on nutrition & aerobic/resistive exercise along with prescribed medications to achieve LDL '70mg'$ , HDL >'40mg'$ .   Expected Outcomes Short Term: Participant states understanding of desired cholesterol values and is compliant with medications prescribed. Participant is following exercise prescription and nutrition guidelines.;Long Term: Cholesterol controlled with medications as prescribed, with individualized exercise RX and with personalized nutrition  plan. Value goals: LDL < '70mg'$ , HDL > 40 mg.   Stress Yes  Fianacial and family. "Wife spends monet when she shouldn't"  and she has chronic illness, neither can cook meals at home, so have to pick up meals. Unable to do what he wants without needing to stop and rest. Wants to be able to do more .    Intervention Offer individual and/or small group education and counseling on adjustment to heart disease, stress management and health-related lifestyle change. Teach and support self-help strategies.;Refer participants experiencing significant psychosocial distress to appropriate mental health specialists for further evaluation and treatment. When possible, include family members and significant others in education/counseling sessions.   Expected Outcomes Short Term: Participant demonstrates changes in health-related behavior, relaxation and other stress management skills, ability to obtain effective social support, and compliance with psychotropic medications if prescribed.;Long Term: Emotional wellbeing is indicated by absence of clinically significant psychosocial distress or social isolation.      Core Components/Risk Factors/Patient Goals Review:    Core Components/Risk Factors/Patient Goals at Discharge (Final Review):    ITP Comments:     ITP Comments    Row Name 10/27/16 1323 11/19/16 0639         ITP Comments Medical review completed. Initial  ITP created.  DIagnosis documentation can be found in [facility]. [facility] note for Cardiac Rehab 30 day review. Continue with ITP unless directed changes per Medical Director review            Comments:

## 2016-11-20 ENCOUNTER — Encounter: Payer: Non-veteran care | Admitting: *Deleted

## 2016-11-20 DIAGNOSIS — I5022 Chronic systolic (congestive) heart failure: Secondary | ICD-10-CM

## 2016-11-20 DIAGNOSIS — I519 Heart disease, unspecified: Secondary | ICD-10-CM | POA: Diagnosis not present

## 2016-11-20 NOTE — Progress Notes (Signed)
Daily Session Note  Patient Details  Name: Andrew [name] MRN: [id number] Date of Birth: [birth year] Referring Provider:     Cardiac Rehab from 10/27/2016 in [facility] Cardiac and Pulmonary Rehab  Referring Provider  [facility]      Encounter Date: 11/20/2016  Check In:     Session Check In - 11/20/16 0915      Check-In   Location [facility]-Cardiac & Pulmonary Rehab   Staff Present Alberteen Sam, MA, ACSM RCEP, Exercise Physiologist;Amanda Oletta Darter, BA, ACSM CEP, Exercise Physiologist;Krista Frederico Hamman, RN BSN   Supervising physician immediately available to respond to emergencies See telemetry face sheet for immediately available ER MD   Medication changes reported     No   Fall or balance concerns reported    No   Warm-up and Cool-down Performed on first and last piece of equipment   Resistance Training Performed Yes   VAD Patient? No     Pain Assessment   Currently in Pain? No/denies   Multiple Pain Sites No         History  Smoking Status  . Former Smoker  . Packs/day: 1.00  . Years: 50.00  . Types: Cigarettes  . Quit date: 05/05/2002  Smokeless Tobacco  . Never Used    Goals Met:  Independence with exercise equipment Exercise tolerated well No report of cardiac concerns or symptoms Strength training completed today  Goals Unmet:  Not Applicable  Comments: Pt able to follow exercise prescription today without complaint.  Will continue to monitor for progression.    Dr. Emily Filbert is Medical Director for [place] and [facility].

## 2016-11-25 DIAGNOSIS — I5022 Chronic systolic (congestive) heart failure: Secondary | ICD-10-CM

## 2016-11-25 DIAGNOSIS — I519 Heart disease, unspecified: Secondary | ICD-10-CM | POA: Diagnosis not present

## 2016-11-25 NOTE — Progress Notes (Signed)
Daily Session Note  Patient Details  Name: Andrew [name] MRN: [id number] Date of Birth: Oct 05, 1945 Referring Provider:     Cardiac Rehab from 10/27/2016 in [facility] Cardiac and Pulmonary Rehab  Referring Provider  [facility]      Encounter Date: 11/25/2016  Check In:     Session Check In - 11/25/16 0853      Check-In   Location [facility]-Cardiac & Pulmonary Rehab   Staff Present Heath Lark, RN, BSN, CCRP;Jessica Luan Pulling, MA, ACSM RCEP, Exercise Physiologist;Amanda Oletta Darter, BA, ACSM CEP, Exercise Physiologist   Supervising physician immediately available to respond to emergencies See telemetry face sheet for immediately available ER MD   Medication changes reported     No   Fall or balance concerns reported    No   Warm-up and Cool-down Performed on first and last piece of equipment   Resistance Training Performed Yes   VAD Patient? No     Pain Assessment   Currently in Pain? No/denies   Multiple Pain Sites No         History  Smoking Status  . Former Smoker  . Packs/day: 1.00  . Years: 50.00  . Types: Cigarettes  . Quit date: 05/05/2002  Smokeless Tobacco  . Never Used    Goals Met:  Independence with exercise equipment Exercise tolerated well No report of cardiac concerns or symptoms Strength training completed today  Goals Unmet:  Not Applicable  Comments: Pt able to follow exercise prescription today without complaint.  Will continue to monitor for progression.    Dr. Emily Filbert is Medical Director for [place] and [facility].

## 2016-11-27 DIAGNOSIS — I5022 Chronic systolic (congestive) heart failure: Secondary | ICD-10-CM

## 2016-11-27 DIAGNOSIS — I519 Heart disease, unspecified: Secondary | ICD-10-CM | POA: Diagnosis not present

## 2016-11-27 NOTE — Progress Notes (Signed)
Daily Session Note  Patient Details  Name: Andrew [name] MRN: [id number] Date of Birth: 09-23-1945 Referring Provider:     Cardiac Rehab from 10/27/2016 in [facility] Cardiac and Pulmonary Rehab  Referring Provider  [facility]      Encounter Date: 11/27/2016  Check In:     Session Check In - 11/27/16 0827      Check-In   Location [facility]-Cardiac & Pulmonary Rehab   Staff Present Alberteen Sam, MA, ACSM RCEP, Exercise Physiologist;Amanda Oletta Darter, BA, ACSM CEP, Exercise Physiologist;Meredith Sherryll Burger, RN BSN   Supervising physician immediately available to respond to emergencies See telemetry face sheet for immediately available ER MD   Medication changes reported     No   Fall or balance concerns reported    No   Warm-up and Cool-down Performed on first and last piece of equipment   Resistance Training Performed Yes   VAD Patient? No     Pain Assessment   Currently in Pain? No/denies           Exercise Prescription Changes - 11/26/16 1300      Response to Exercise   Blood Pressure (Admit) 122/60   Blood Pressure (Exercise) 136/66   Blood Pressure (Exit) 116/62   Heart Rate (Admit) 76 bpm   Heart Rate (Exercise) 120 bpm   Heart Rate (Exit) 72 bpm   Rating of Perceived Exertion (Exercise) 13   Symptoms none   Duration Continue with 45 min of aerobic exercise without signs/symptoms of physical distress.   Intensity THRR unchanged     Progression   Progression Continue to progress workloads to maintain intensity without signs/symptoms of physical distress.   Average METs 4.07     Resistance Training   Training Prescription Yes   Weight 4 lbs   Reps 10-15     Interval Training   Interval Training No     Treadmill   MPH 2.5   Grade 2   Minutes 15   METs 3.6     NuStep   Level 4   Minutes 15   METs 3.4     REL-XR   Level 5   Minutes 15   METs 5.2      History  Smoking Status  . Former Smoker  . Packs/day: 1.00  . Years: 50.00  . Types: Cigarettes  .  Quit date: 05/05/2002  Smokeless Tobacco  . Never Used    Goals Met:  Independence with exercise equipment Exercise tolerated well Personal goals reviewed No report of cardiac concerns or symptoms Strength training completed today  Goals Unmet:  Not Applicable  Comments: Pt able to follow exercise prescription today without complaint.  Will continue to monitor for progression.    Dr. Emily Filbert is Medical Director for [place] and [facility].

## 2016-12-02 DIAGNOSIS — I5022 Chronic systolic (congestive) heart failure: Secondary | ICD-10-CM

## 2016-12-02 DIAGNOSIS — I519 Heart disease, unspecified: Secondary | ICD-10-CM | POA: Diagnosis not present

## 2016-12-02 NOTE — Progress Notes (Signed)
Daily Session Note  Patient Details  Name: Andrew [name] MRN: [id number] Date of Birth: [birth year] Referring Provider:     Cardiac Rehab from 10/27/2016 in [facility] Cardiac and Pulmonary Rehab  Referring Provider  [facility]      Encounter Date: 12/02/2016  Check In:     Session Check In - 12/02/16 0829      Check-In   Location [facility]-Cardiac & Pulmonary Rehab   Staff Present Gerlene Burdock, RN, Vickki Hearing, BA, ACSM CEP, Exercise Physiologist;Krista Frederico Hamman, RN BSN   Supervising physician immediately available to respond to emergencies See telemetry face sheet for immediately available ER MD   Medication changes reported     No   Fall or balance concerns reported    No   Warm-up and Cool-down Performed on first and last piece of equipment   Resistance Training Performed Yes   VAD Patient? No     Pain Assessment   Currently in Pain? No/denies         History  Smoking Status  . Former Smoker  . Packs/day: 1.00  . Years: 50.00  . Types: Cigarettes  . Quit date: 05/05/2002  Smokeless Tobacco  . Never Used    Goals Met:  Independence with exercise equipment Exercise tolerated well No report of cardiac concerns or symptoms Strength training completed today  Goals Unmet:  Not Applicable  Comments: Pt able to follow exercise prescription today without complaint.  Will continue to monitor for progression.    Dr. Emily Filbert is Medical Director for [place] and [facility].

## 2016-12-04 ENCOUNTER — Encounter: Payer: Non-veteran care | Attending: Internal Medicine

## 2016-12-04 DIAGNOSIS — I5022 Chronic systolic (congestive) heart failure: Secondary | ICD-10-CM

## 2016-12-04 DIAGNOSIS — I519 Heart disease, unspecified: Secondary | ICD-10-CM | POA: Diagnosis present

## 2016-12-04 NOTE — Progress Notes (Signed)
Daily Session Note  Patient Details  Name: Andrew [name] MRN: [id number] Date of Birth: 03-08-1946 Referring Provider:     Cardiac Rehab from 10/27/2016 in [facility] Cardiac and Pulmonary Rehab  Referring Provider  [facility]      Encounter Date: 12/04/2016  Check In:     Session Check In - 12/04/16 0839      Check-In   Location [facility]-Cardiac & Pulmonary Rehab   Staff Present Nada Maclachlan, BA, ACSM CEP, Exercise Physiologist;Jessica Luan Pulling, MA, ACSM RCEP, Exercise Physiologist;Meredith Sherryll Burger, RN BSN   Supervising physician immediately available to respond to emergencies See telemetry face sheet for immediately available ER MD   Medication changes reported     No   Fall or balance concerns reported    No   Warm-up and Cool-down Performed on first and last piece of equipment   Resistance Training Performed Yes     Pain Assessment   Currently in Pain? No/denies         History  Smoking Status  . Former Smoker  . Packs/day: 1.00  . Years: 50.00  . Types: Cigarettes  . Quit date: 05/05/2002  Smokeless Tobacco  . Never Used    Goals Met:  Proper associated with RPD/PD & O2 Sat Independence with exercise equipment Exercise tolerated well Strength training completed today  Goals Unmet:  Not Applicable  Comments: Pt able to follow exercise prescription today without complaint.  Will continue to monitor for progression.    Dr. Emily Filbert is Medical Director for [place] and [facility].

## 2016-12-09 ENCOUNTER — Encounter: Payer: Non-veteran care | Admitting: *Deleted

## 2016-12-09 DIAGNOSIS — I5022 Chronic systolic (congestive) heart failure: Secondary | ICD-10-CM

## 2016-12-09 DIAGNOSIS — I519 Heart disease, unspecified: Secondary | ICD-10-CM | POA: Diagnosis not present

## 2016-12-09 NOTE — Progress Notes (Signed)
Daily Session Note  Patient Details  Name: KENECHUKWU ECKSTEIN MRN: [id number] Date of Birth: 1945-07-05 Referring Provider:     Cardiac Rehab from 10/27/2016 in [facility] Cardiac and Pulmonary Rehab  Referring Provider  [facility]      Encounter Date: 12/09/2016  Check In:     Session Check In - 12/09/16 0815      Check-In   Location [facility]-Cardiac & Pulmonary Rehab   Staff Present Alberteen Sam, MA, ACSM RCEP, Exercise Physiologist;Susanne Bice, RN, BSN, Florestine Avers, RN BSN   Supervising physician immediately available to respond to emergencies See telemetry face sheet for immediately available ER MD   Medication changes reported     No   Fall or balance concerns reported    No   Warm-up and Cool-down Performed on first and last piece of equipment   Resistance Training Performed Yes   VAD Patient? No     Pain Assessment   Currently in Pain? No/denies   Multiple Pain Sites No         History  Smoking Status  . Former Smoker  . Packs/day: 1.00  . Years: 50.00  . Types: Cigarettes  . Quit date: 05/05/2002  Smokeless Tobacco  . Never Used    Goals Met:  Independence with exercise equipment Exercise tolerated well No report of cardiac concerns or symptoms Strength training completed today  Goals Unmet:  Not Applicable  Comments: Pt able to follow exercise prescription today without complaint.  Will continue to monitor for progression.    Dr. Emily Filbert is Medical Director for [place] and [facility].

## 2016-12-11 ENCOUNTER — Encounter: Payer: Non-veteran care | Admitting: *Deleted

## 2016-12-11 DIAGNOSIS — I519 Heart disease, unspecified: Secondary | ICD-10-CM | POA: Diagnosis not present

## 2016-12-11 DIAGNOSIS — I5022 Chronic systolic (congestive) heart failure: Secondary | ICD-10-CM

## 2016-12-11 NOTE — Progress Notes (Signed)
Daily Session Note  Patient Details  Name: JOZEPH PERSING MRN: [id number] Date of Birth: 12-11-1945 Referring Provider:     Cardiac Rehab from 10/27/2016 in [facility] Cardiac and Pulmonary Rehab  Referring Provider  [facility]      Encounter Date: 12/11/2016  Check In:     Session Check In - 12/11/16 0815      Check-In   Location [facility]-Cardiac & Pulmonary Rehab   Staff Present Alberteen Sam, MA, ACSM RCEP, Exercise Physiologist;Krista Frederico Hamman, RN BSN;Meredith Sherryll Burger, RN BSN   Supervising physician immediately available to respond to emergencies See telemetry face sheet for immediately available ER MD   Medication changes reported     No   Fall or balance concerns reported    No   Warm-up and Cool-down Performed on first and last piece of equipment   Resistance Training Performed Yes   VAD Patient? No     Pain Assessment   Currently in Pain? No/denies   Multiple Pain Sites No         History  Smoking Status  . Former Smoker  . Packs/day: 1.00  . Years: 50.00  . Types: Cigarettes  . Quit date: 05/05/2002  Smokeless Tobacco  . Never Used    Goals Met:  Independence with exercise equipment Exercise tolerated well No report of cardiac concerns or symptoms Strength training completed today  Goals Unmet:  Not Applicable  Comments:  Pt able to follow exercise prescription today without complaint.  Will continue to monitor for progression.   Dr. Emily Filbert is Medical Director for [place] and [facility].

## 2016-12-16 ENCOUNTER — Encounter: Payer: Non-veteran care | Admitting: *Deleted

## 2016-12-16 DIAGNOSIS — I5022 Chronic systolic (congestive) heart failure: Secondary | ICD-10-CM

## 2016-12-16 DIAGNOSIS — I519 Heart disease, unspecified: Secondary | ICD-10-CM | POA: Diagnosis not present

## 2016-12-16 NOTE — Progress Notes (Signed)
Daily Session Note  Patient Details  Name: Andrew [name] MRN: [id number] Date of Birth: [birth year] Referring Provider:     Cardiac Rehab from 10/27/2016 in [facility] Cardiac and Pulmonary Rehab  Referring Provider  [facility]      Encounter Date: 12/16/2016  Check In:     Session Check In - 12/16/16 0821      Check-In   Location [facility]-Cardiac & Pulmonary Rehab   Staff Present Heath Lark, RN, BSN, CCRP;Doneshia Hill Luan Pulling, MA, ACSM RCEP, Exercise Physiologist;Amanda Oletta Darter, [place], ACSM CEP, Exercise Physiologist   Supervising physician immediately available to respond to emergencies See telemetry face sheet for immediately available ER MD   Medication changes reported     No   Fall or balance concerns reported    No   Warm-up and Cool-down Performed on first and last piece of equipment   Resistance Training Performed Yes   VAD Patient? No     Pain Assessment   Currently in Pain? No/denies   Multiple Pain Sites No         History  Smoking Status  . Former Smoker  . Packs/day: 1.00  . Years: 50.00  . Types: Cigarettes  . Quit date: 05/05/2002  Smokeless Tobacco  . Never Used    Goals Met:  Independence with exercise equipment Exercise tolerated well No report of cardiac concerns or symptoms Strength training completed today  Goals Unmet:  Not Applicable  Comments: Pt able to follow exercise prescription today without complaint.  Will continue to monitor for progression.    Dr. Emily Filbert is Medical Director for [place] and [facility].

## 2016-12-17 ENCOUNTER — Encounter: Payer: Self-pay | Admitting: *Deleted

## 2016-12-17 DIAGNOSIS — I5022 Chronic systolic (congestive) heart failure: Secondary | ICD-10-CM

## 2016-12-17 NOTE — Progress Notes (Signed)
Cardiac Individual Treatment Plan  Patient Details  Name: Andrew [name] MRN: [id number] Date of Birth: 1946/02/05 Referring Provider:     Cardiac Rehab from 10/27/2016 in [facility] Cardiac and Pulmonary Rehab  Referring Provider  [facility]      Initial Encounter Date:    Cardiac Rehab from 10/27/2016 in [facility] Cardiac and Pulmonary Rehab  Date  10/27/16  Referring Provider  [facility]      Visit Diagnosis: Heart failure, chronic systolic ([place])  Patient's Home Medications on Admission:  Current Outpatient Prescriptions:  .  acetaminophen (TYLENOL) 325 MG tablet, Take 975 mg by mouth 3 (three) times daily. , Disp: , Rfl:  .  aspirin EC 81 MG tablet, Take 81 mg by mouth at bedtime., Disp: , Rfl:  .  carvedilol (COREG) 12.5 MG tablet, Take 12.5 mg by mouth 2 (two) times daily with a meal.  , Disp: , Rfl:  .  cefUROXime (CEFTIN) 500 MG tablet, Take 1 tablet (500 mg total) by mouth 2 (two) times daily with a meal., Disp: 4 tablet, Rfl: 0 .  citalopram (CELEXA) 20 MG tablet, Take 20 mg by mouth at bedtime., Disp: , Rfl:  .  clopidogrel (PLAVIX) 75 MG tablet, Take 75 mg by mouth at bedtime., Disp: , Rfl:  .  finasteride (PROSCAR) 5 MG tablet, Take 5 mg by mouth daily., Disp: , Rfl:  .  fluconazole (DIFLUCAN) 200 MG tablet, Take 200 mg by mouth daily., Disp: , Rfl:  .  furosemide (LASIX) 20 MG tablet, Take 20 mg by mouth at bedtime., Disp: , Rfl:  .  gabapentin (NEURONTIN) 400 MG capsule, Take 1,200 mg by mouth 3 (three) times daily., Disp: , Rfl:  .  hydrocerin (EUCERIN) CREA, Apply 1 application topically 2 (two) times daily as needed (for irritation)., Disp: , Rfl:  .  hydrocortisone 2.5 % ointment, Apply 1 application topically 2 (two) times daily as needed (for itching)., Disp: , Rfl:  .  isosorbide mononitrate (IMDUR) 30 MG 24 hr tablet, Take 30 mg by mouth daily as needed (for increased BP)., Disp: , Rfl:  .  lisinopril (PRINIVIL,ZESTRIL) 10 MG tablet, Take 10 mg by mouth daily., Disp: ,  Rfl:  .  Menthol-Methyl Salicylate (THERA-GESIC EX), Apply 1 application topically 3 (three) times daily as needed (for pain)., Disp: , Rfl:  .  methocarbamol (ROBAXIN) 750 MG tablet, Take 750 mg by mouth 2 (two) times daily as needed for muscle spasms., Disp: , Rfl:  .  metoprolol succinate (TOPROL-XL) 25 MG 24 hr tablet, Take 25 mg by mouth daily., Disp: , Rfl:  .  mexiletine (MEXITIL) 250 MG capsule, Take 250 mg by mouth 2 (two) times daily.  , Disp: , Rfl:  .  niacin 500 MG tablet, Take 500 mg by mouth at bedtime., Disp: , Rfl:  .  nitroGLYCERIN (NITROSTAT) 0.4 MG SL tablet, Place 0.4 mg under the tongue every 5 (five) minutes as needed for chest pain. , Disp: , Rfl:  .  Omega-3 Fatty Acids (FISH OIL) 1000 MG CAPS, Take 1,000 mg by mouth at bedtime., Disp: , Rfl:  .  omeprazole (PRILOSEC) 20 MG capsule, Take 40 mg by mouth 2 (two) times daily before a meal., Disp: , Rfl:  .  quiniDINE gluconate 324 MG CR tablet, Take 324 mg by mouth 2 (two) times daily., Disp: , Rfl:  .  simvastatin (ZOCOR) 40 MG tablet, Take 40 mg by mouth at bedtime., Disp: , Rfl:  .  Tamsulosin HCl (  FLOMAX) 0.4 MG CAPS, Take 0.4 mg by mouth 2 (two) times daily. , Disp: , Rfl:   Past Medical History: Past Medical History:  Diagnosis Date  . Atrial fibrillation ([place])   . CAD (coronary artery disease)    a. s/p CABG 1997;  b. s/p PCI to [facility] 2006;  c. s/p BMS x 3 to S-RCA 10/10;  d.  Myoview 2010: EF 24%, basal inf, mid inf. apical inf, basal inf-lat and mid IL scar, no isch;  e. cath 1/11: S-RCA occluded, S-OM chronically occluded, L-LAD ok with L-R collaterals (med. Tx)  . HLD (hyperlipidemia)   . HTN (hypertension)   . Ischemic cardiomyopathy    a. EF 25-35%;   b. echo 3/11: EF 35%, mild LVH, severe inf/post HK, lat and apical HK, grade 1 diast dysfxn, mild AI, mild to mod MR, PASP 32  . Systolic CHF, chronic ([place])   . Ventricular tachycardia ([place])    a. s/p AICD 2006;   b. Amio intolerant due to liver and thyroid  issues;   c. 2/12:  quinidine added to mexilitine  (consider Tikosyn in future??)    Tobacco Use: History  Smoking Status  . Former Smoker  . Packs/day: 1.00  . Years: 50.00  . Types: Cigarettes  . Quit date: 05/05/2002  Smokeless Tobacco  . Never Used    Labs: Recent Review Flowsheet Data    Labs for ITP Cardiac and Pulmonary Rehab Latest Ref Rng & Units 05/06/2009 05/07/2009 10/15/2009 01/08/2010 05/29/2010   Cholestrol 0 - 200 mg/dL - 129 ATP III CLASSIFICATION: <200     mg/dL   Desirable 200-239  mg/dL   Borderline High >=240    mg/dL   High 253(H) 106 -   LDLCALC 0 - 99 mg/dL - 52 Total Cholesterol/HDL:CHD Risk Coronary Heart Disease Risk Table Men   Women 1/2 Average Risk   3.4   3.3 Average Risk       5.0   4.4 2 X Average Risk   9.6   7.1 3 X Average Risk  23.4   11.0 Use the calculated Patient Ratio above and the CHD Risk Table to determine the patient's CHD Risk. ATP III CLASSIFICATION (LDL): <100     mg/dL   Optimal 100-129  mg/dL   Near or Above Optimal 130-159  mg/dL   Borderline 160-189  mg/dL   High >190     mg/dL   Very High 156(H) 33 -   HDL >39 mg/dL - 38(L) 34(L) 35(L) -   Trlycerides <150 mg/dL - 194(H) 317(H) 188(H) -   Hemoglobin A1c 4.6 - 6.1 % 6.2 (NOTE) The ADA recommends the following therapeutic goal for glycemic control related to Hgb A1c measurement: Goal of therapy: <6.5 Hgb A1c  Reference: American Diabetes Association: Clinical Practice Recommendations 2010, Diabetes Care, 2010, 33: (Suppl 1).(H) - - - -   TCO2 0 - 100 mmol/L - - - - 29       Exercise Target Goals:    Exercise Program Goal: Individual exercise prescription set with THRR, safety & activity barriers. Participant demonstrates ability to understand and report RPE using BORG scale, to self-measure pulse accurately, and to acknowledge the importance of the exercise prescription.  Exercise Prescription Goal: Starting with aerobic activity 30 plus minutes a day, 3 days per week  for initial exercise prescription. Provide home exercise prescription and guidelines that participant acknowledges understanding prior to discharge.  Activity Barriers & Risk Stratification:     Activity Barriers &  Cardiac Risk Stratification - 10/27/16 1054      Activity Barriers & Cardiac Risk Stratification   Activity Barriers Arthritis;Joint Problems;Shortness of Breath;Back Problems;Neck/Spine Problems;Muscular Weakness;History of Falls;Balance Concerns;Assistive Device;Deconditioning  bilateral hip pain, neck pain occasionally, compressed disc L5-S1   Cardiac Risk Stratification High      6 Minute Walk:     6 Minute Walk    Row Name 10/27/16 1448         6 Minute Walk   Phase Initial     Distance 955 feet     Walk Time 5.75 minutes     # of Rest Breaks 1  15 sec     MPH 1.88     METS 2     RPE 14     Perceived Dyspnea  1     VO2 Peak 6.99     Symptoms Yes (comment)     Comments using cane, feels wobbly, hip pain 9/10, slightly SOB     Resting HR 71 bpm     Resting BP 154/66     Max Ex. HR 89 bpm     Max Ex. BP 138/74     2 Minute Post BP 132/60        Oxygen Initial Assessment:   Oxygen Re-Evaluation:   Oxygen Discharge (Final Oxygen Re-Evaluation):   Initial Exercise Prescription:     Initial Exercise Prescription - 10/27/16 1400      Date of Initial Exercise RX and Referring Provider   Date 10/27/16   Referring Provider [facility]     Treadmill   MPH 1.3   Grade 0   Minutes 15   METs 2     NuStep   Level 1   SPM 80   Minutes 15   METs 2     REL-XR   Level 1   Speed 50   Minutes 15   METs 2     Prescription Details   Frequency (times per week) 2   Duration Progress to 45 minutes of aerobic exercise without signs/symptoms of physical distress     Intensity   THRR 40-80% of Max Heartrate 102-133   Ratings of Perceived Exertion 11-13   Perceived Dyspnea 0-4     Progression   Progression Continue to progress workloads to  maintain intensity without signs/symptoms of physical distress.     Resistance Training   Training Prescription Yes   Weight 3 lbs      Perform Capillary Blood Glucose checks as needed.  Exercise Prescription Changes:     Exercise Prescription Changes    Row Name 10/27/16 1300 10/28/16 1300 11/04/16 1000 11/12/16 1400 11/26/16 1300     Response to Exercise   Blood Pressure (Admit) 154/66 134/70  - 112/66 122/60   Blood Pressure (Exercise) 138/74 130/66  - 136/72 136/66   Blood Pressure (Exit) 132/60 98/60  - 104/60 116/62   Heart Rate (Admit) 71 bpm 77 bpm  - 75 bpm 76 bpm   Heart Rate (Exercise) 88 bpm 99 bpm  - 107 bpm 120 bpm   Heart Rate (Exit) 75 bpm 73 bpm  - 73 bpm 72 bpm   Oxygen Saturation (Admit) 96 %  -  -  -  -   Oxygen Saturation (Exercise) 97 %  -  -  -  -   Rating of Perceived Exertion (Exercise) 14 13  - 11 13   Perceived Dyspnea (Exercise) 1  -  -  -  -  Symptoms hip pain (9/10), slightly SOB, using cane, feels wobbly none  - none none   Comments walk test results first full day of exercise  -  -  -   Duration  - Progress to 45 minutes of aerobic exercise without signs/symptoms of physical distress  - Progress to 45 minutes of aerobic exercise without signs/symptoms of physical distress Continue with 45 min of aerobic exercise without signs/symptoms of physical distress.   Intensity  - THRR unchanged  - THRR unchanged THRR unchanged     Progression   Progression  - Continue to progress workloads to maintain intensity without signs/symptoms of physical distress.  - Continue to progress workloads to maintain intensity without signs/symptoms of physical distress. Continue to progress workloads to maintain intensity without signs/symptoms of physical distress.   Average METs  - 2.25  - 3.65 4.07     Resistance Training   Training Prescription  - Yes  - Yes Yes   Weight  - 3 lbs  - 3 lb 4 lbs   Reps  - 10-15  - 10-15 10-15     Interval Training   Interval  Training  - No  - No No     Treadmill   MPH  -  -  -  - 2.5   Grade  -  -  -  - 2   Minutes  -  -  -  - 15   METs  -  -  -  - 3.6     NuStep   Level  - 1  - 1 4   Minutes  - 15  - 15 15   METs  - 1.9  - 3.2 3.4     REL-XR   Level  - 1  - 1 5   Speed  - 50  -  -  -   Minutes  - 15  - 15 15   METs  - 2.6  - 4.1 5.2     Home Exercise Plan   Plans to continue exercise at  -  - Home (comment)  walk  -  -   Frequency  -  - Add 2 additional days to program exercise sessions.  -  -   Initial Home Exercises Provided  -  - 11/04/16  -  -   [place] Name 12/11/16 1500             Response to Exercise   Blood Pressure (Admit) 116/64       Blood Pressure (Exercise) 138/82       Blood Pressure (Exit) 128/70       Heart Rate (Admit) 75 bpm       Heart Rate (Exercise) 113 bpm       Heart Rate (Exit) 81 bpm       Rating of Perceived Exertion (Exercise) 13       Symptoms none       Duration Continue with 45 min of aerobic exercise without signs/symptoms of physical distress.       Intensity THRR unchanged         Progression   Progression Continue to progress workloads to maintain intensity without signs/symptoms of physical distress.       Average METs 4.47         Resistance Training   Training Prescription Yes       Weight 4 lbs       Reps 10-15  Interval Training   Interval Training No         Treadmill   MPH 2.5       Grade 2       Minutes 15       METs 3.6         NuStep   Level 4       Minutes 15       METs 4.4         REL-XR   Level 5       Minutes 15       METs 5.4         Home Exercise Plan   Plans to continue exercise at Home (comment)  walk       Frequency Add 2 additional days to program exercise sessions.       Initial Home Exercises Provided 11/04/16          Exercise Comments:     Exercise Comments    Row Name 10/28/16 1048           Exercise Comments First full day of exercise!  Patient was oriented to gym and equipment including  functions, settings, policies, and procedures.  Patient's individual exercise prescription and treatment plan were reviewed.  All starting workloads were established based on the results of the 6 minute walk test done at initial orientation visit.  The plan for exercise progression was also introduced and progression will be customized based on patient's performance and goals.          Exercise Goals and Review:     Exercise Goals    Row Name 10/27/16 1452             Exercise Goals   Increase Physical Activity Yes       Intervention Provide advice, education, support and counseling about physical activity/exercise needs.;Develop an individualized exercise prescription for aerobic and resistive training based on initial evaluation findings, risk stratification, comorbidities and participant's personal goals.       Expected Outcomes Achievement of increased cardiorespiratory fitness and enhanced flexibility, muscular endurance and strength shown through measurements of functional capacity and personal statement of participant.       Increase Strength and Stamina Yes       Intervention Provide advice, education, support and counseling about physical activity/exercise needs.;Develop an individualized exercise prescription for aerobic and resistive training based on initial evaluation findings, risk stratification, comorbidities and participant's personal goals.       Expected Outcomes Achievement of increased cardiorespiratory fitness and enhanced flexibility, muscular endurance and strength shown through measurements of functional capacity and personal statement of participant.          Exercise Goals Re-Evaluation :     Exercise Goals Re-Evaluation    Row Name 10/28/16 1311 11/04/16 1018 11/12/16 1448 11/26/16 1350 11/27/16 0917     Exercise Goal Re-Evaluation   Exercise Goals Review Increase Physical Activity;Increase Strenth and Stamina Increase Physical Activity;Increase Strenth and  Stamina Increase Physical Activity;Increase Strenth and Stamina Increase Physical Activity;Increase Strenth and Stamina Increase Physical Activity;Increase Strenth and Stamina   Comments Andrew [name] completed his first day of exercise today!  He did well on his thirty minutes of exericse. We will continue to monitor his progression.  Reviewed home exercise with pt today.  Pt plans to walk for exercise.  Reviewed THR, pulse, RPE, sign and symptoms, NTG use, and when to call 911 or MD.  Also discussed weather considerations and indoor options.  Pt voiced  understanding. Andrew [name] is progressing well with exercise and has increased his average MET level. Andrew [name] continues to do well in rehab.  He is now up to level 5 on the XR.  He alternates using his arms, legs, and both.  We will continue to monitor his progression. Andrew [name] is walking with his dogs at home for 28mn every day.  He is also playing golf and working out in the yard more.  He still wants to lose weight so we talked about increasing the time he spends walking the dogs as a way to burn more calories.   Expected Outcomes Short and Long: Continue to come to classes to work on increasing physical activity. Short - begin home exercise Long - Continue to exercise independently Short - Andrew [name] willl attend Cardiac Rehab regularly and continue to progress.  Long - Andrew [name] will coontinue to exercise independently. Short: Andrew [name] will continue to try to increase his workloads.  Long: Andrew [name] will continue to exercise independently Short: Increase time walking dogs.  Long: Continue to exercise independently.   [place] 12/11/16 1508             Exercise Goal Re-Evaluation   Exercise Goals Review Increase Physical Activity;Increase Strenth and Stamina       Comments Andrew [name] has been doing well in rehab.  He still likes to alternate extremities on the XR to work all the muscle groups.  We will continue to monitor her progression.        Expected Outcomes Short: Increase incline on treadmill.   Long: Continue to exercise independently          Discharge Exercise Prescription (Final Exercise Prescription Changes):     Exercise Prescription Changes - 12/11/16 1500      Response to Exercise   Blood Pressure (Admit) 116/64   Blood Pressure (Exercise) 138/82   Blood Pressure (Exit) 128/70   Heart Rate (Admit) 75 bpm   Heart Rate (Exercise) 113 bpm   Heart Rate (Exit) 81 bpm   Rating of Perceived Exertion (Exercise) 13   Symptoms none   Duration Continue with 45 min of aerobic exercise without signs/symptoms of physical distress.   Intensity THRR unchanged     Progression   Progression Continue to progress workloads to maintain intensity without signs/symptoms of physical distress.   Average METs 4.47     Resistance Training   Training Prescription Yes   Weight 4 lbs   Reps 10-15     Interval Training   Interval Training No     Treadmill   MPH 2.5   Grade 2   Minutes 15   METs 3.6     NuStep   Level 4   Minutes 15   METs 4.4     REL-XR   Level 5   Minutes 15   METs 5.4     Home Exercise Plan   Plans to continue exercise at Home (comment)  walk   Frequency Add 2 additional days to program exercise sessions.   Initial Home Exercises Provided 11/04/16      Nutrition:  Target Goals: Understanding of nutrition guidelines, daily intake of sodium '1500mg'$ , cholesterol '200mg'$ , calories 30% from fat and 7% or less from saturated fats, daily to have 5 or more servings of fruits and vegetables.  Biometrics:     Pre Biometrics - 10/27/16 1452      Pre Biometrics   Height 5' 6.9" (1.699 m)   Weight 195 lb 3.2 oz (88.5 kg)  Waist Circumference 40 inches   Hip Circumference 40.25 inches   Waist to Hip Ratio 0.99 %   BMI (Calculated) 30.7   Single Leg Stand 22.34 seconds       Nutrition Therapy Plan and Nutrition Goals:     Nutrition Therapy & Goals - 11/18/16 1159      Nutrition Therapy   Diet TLC   Protein (specify units) 80grams daily    Saturated Fats 13 max. grams   Sodium 1500 grams     Personal Nutrition Goals   Nutrition Goal Eat meals at home more often, to save both calories and money   Personal Goal #2 increase vegetables, eat 2 times a day with meals   Personal Goal #3 Decrease sugar-sweetened drinks -- sodas, sweet tea, ?lemonade   Comments Mr. Liendo is unable to stand for long periods of time to cook, his wife cannot cook due to disability, so they eat out often. Discussed basic meal planning and options for easy balanced meals. Discussed importance of controlling sugar intake with elevated HbA1C, advised reducing sugar intake especially from beverages, he agrees. He would like to lose weight, and set goals to decrease calories from restaurant meals and sugary drinks.     Intervention Plan   Intervention Prescribe, educate and counsel regarding individualized specific dietary modifications aiming towards targeted core components such as weight, hypertension, lipid management, diabetes, heart failure and other comorbidities.;Nutrition handout(s) given to patient.   Expected Outcomes Short Term Goal: Understand basic principles of dietary content, such as calories, fat, sodium, cholesterol and nutrients.;Short Term Goal: A plan has been developed with personal nutrition goals set during dietitian appointment.;Long Term Goal: Adherence to prescribed nutrition plan.      Nutrition Discharge: Rate Your Plate Scores:     Nutrition Assessments - 10/27/16 1331      MEDFICTS Scores   Pre Score 48      Nutrition Goals Re-Evaluation:     Nutrition Goals Re-Evaluation    [place] Name 11/27/16 0935             Goals   Current Weight 197 lb (89.4 kg)       Nutrition Goal Eat meals at home more often, to save both calories and money, eat more vegetables, decrease sugary drinks       Comment Andrew [name] continues to eat out for most meals since his wife does not cook.  He is starting to like salads more and is eating more  vegetables than he ever used to.  He has also cut out most of his sugary drinks and sticks primarily to water and coffee.  We did talk about trying to make sure he is eating a good salad, with nutritious greens,  and not too much dressing.       Expected Outcome Short: Trade out iceberg lettuce for a different green.  Long: Try to eat more at home.          Nutrition Goals Discharge (Final Nutrition Goals Re-Evaluation):     Nutrition Goals Re-Evaluation - 11/27/16 0935      Goals   Current Weight 197 lb (89.4 kg)   Nutrition Goal Eat meals at home more often, to save both calories and money, eat more vegetables, decrease sugary drinks   Comment Andrew [name] continues to eat out for most meals since his wife does not cook.  He is starting to like salads more and is eating more vegetables than he ever used to.  He has also cut  out most of his sugary drinks and sticks primarily to water and coffee.  We did talk about trying to make sure he is eating a good salad, with nutritious greens,  and not too much dressing.   Expected Outcome Short: Trade out iceberg lettuce for a different green.  Long: Try to eat more at home.      Psychosocial: Target Goals: Acknowledge presence or absence of significant depression and/or stress, maximize coping skills, provide positive support system. Participant is able to verbalize types and ability to use techniques and skills needed for reducing stress and depression.   Initial Review & Psychosocial Screening:     Initial Psych Review & Screening - 10/27/16 1338      Initial Review   Source of Stress Concerns Chronic Illness;Family;Financial;Unable to perform yard/household activities   Comments Stress with wife spending money , stress with inablility to perform chores without stopping and resting all the time.       Quality of Life Scores:      Quality of Life - 10/27/16 1048      Quality of Life Scores   Health/Function Pre 14.6 %   Socioeconomic Pre  24.58 %   Psych/Spiritual Pre 22.29 %   Family Pre 16.8 %   GLOBAL Pre 18.38 %      PHQ-9: Recent Review Flowsheet Data    Depression screen [facility] 2/9 10/27/2016   Decreased Interest 1   Down, Depressed, Hopeless 0   PHQ - 2 Score 1   Altered sleeping 1   Tired, decreased energy 3   Change in appetite 1   Feeling bad or failure about yourself  0   Trouble concentrating 1   Moving slowly or fidgety/restless 0   Suicidal thoughts 0   PHQ-9 Score 7   Difficult doing work/chores Somewhat difficult     Interpretation of Total Score  Total Score Depression Severity:  1-4 = Minimal depression, 5-9 = Mild depression, 10-14 = Moderate depression, 15-19 = Moderately severe depression, 20-27 = Severe depression   Psychosocial Evaluation and Intervention:     Psychosocial Evaluation - 10/28/16 0946      Psychosocial Evaluation & Interventions   Interventions Encouraged to exercise with the program and follow exercise prescription;Stress management education   Comments Counselor met with Andrew [name] today (Andrew [name]) for initial psychosocial evaluation.  He is a 71 year old who had a mild heart attack a few months ago and was [facility].  He has a strong support system with a spouse of 63 years and a daughter who lives close by.  Andrew [name] reports he sleeps well but not a great appetite.  He admits to a history fo depression in the past (undiagnosed) but states he is typically in a positive mood currently.  Andrew [name] has stress currently with his spouse's health (she is disabled); and finances due to his spouse's spending habits.  He has goals to lose weight; eat healthier; and be healthier while in this program.  Counselor and staff will continue to follow with Andrew [name] throughout the course of this program.     Expected Outcomes Andrew [name] will benefit from the psychoeducational components of this program for stress management and coping strategies.  He also will benefit from meeting  with the dietician to learn more about healthier eating habits.  Andrew [name] needs to exercise consistently to achieve his stated goals.     Continue Psychosocial Services  Follow up required by staff  Psychosocial Re-Evaluation:     Psychosocial Re-Evaluation    [place] Name 11/27/16 [phone]             Psychosocial Re-Evaluation   Current issues with Current Sleep Concerns;Current Stress Concerns       Comments Andrew [name] has been doing well in rehab.  He has enjoyed the exercise.  He continues to worry about his wife, her health, and her spending habits.  He tried to give her an allowance, but she is still over spending.  He has also started to worry more about himself and whether or not he will be around long enough to make sure that his wife is taken care of and be able to care for her as long as he can.  He has noticed that he has become more forgetful recently.  He loses his keys and phone but his biggest concern is he is forgetting to zip his pants before leaving the house.  He has also had a few bout of forgetfulness at the store and remember where he was going.       Expected Outcomes Short: Take care of himself so that he can stay healthy.  Long: Continue to focus healthy lifestyle       Interventions Stress management education;Encouraged to attend Cardiac Rehabilitation for the exercise         Initial Review   Source of Stress Concerns Family;Financial;Chronic Illness          Psychosocial Discharge (Final Psychosocial Re-Evaluation):     Psychosocial Re-Evaluation - 11/27/16 0939      Psychosocial Re-Evaluation   Current issues with Current Sleep Concerns;Current Stress Concerns   Comments Andrew [name] has been doing well in rehab.  He has enjoyed the exercise.  He continues to worry about his wife, her health, and her spending habits.  He tried to give her an allowance, but she is still over spending.  He has also started to worry more about himself and whether or not he will be around long  enough to make sure that his wife is taken care of and be able to care for her as long as he can.  He has noticed that he has become more forgetful recently.  He loses his keys and phone but his biggest concern is he is forgetting to zip his pants before leaving the house.  He has also had a few bout of forgetfulness at the store and remember where he was going.   Expected Outcomes Short: Take care of himself so that he can stay healthy.  Long: Continue to focus healthy lifestyle   Interventions Stress management education;Encouraged to attend Cardiac Rehabilitation for the exercise     Initial Review   Source of Stress Concerns Family;Financial;Chronic Illness      Vocational Rehabilitation: Provide vocational rehab assistance to qualifying candidates.   Vocational Rehab Evaluation & Intervention:     Vocational Rehab - 10/27/16 1052      Initial Vocational Rehab Evaluation & Intervention   Assessment shows need for Vocational Rehabilitation No      Education: Education Goals: Education classes will be provided on a weekly basis, covering required topics. Participant will state understanding/return demonstration of topics presented.  Learning Barriers/Preferences:     Learning Barriers/Preferences - 10/27/16 1343      Learning Barriers/Preferences   Learning Barriers None   Learning Preferences Individual Instruction;Pictoral;Skilled Demonstration;Video      Education Topics: General Nutrition Guidelines/Fats and Fiber: -Group instruction provided by verbal, written material,  models and posters to present the general guidelines for heart healthy nutrition. Gives an explanation and review of dietary fats and fiber.   Cardiac Rehab from 12/16/2016 in [facility] Cardiac and Pulmonary Rehab  Date  10/28/16  Educator  CR  Instruction Review Code  2- meets goals/outcomes      Controlling Sodium/Reading Food Labels: -Group verbal and written material supporting the discussion of  sodium use in heart healthy nutrition. Review and explanation with models, verbal and written materials for utilization of the food label.   Cardiac Rehab from 12/16/2016 in [facility] Cardiac and Pulmonary Rehab  Date  11/04/16  Educator  PI  Instruction Review Code  2- meets goals/outcomes      Exercise Physiology & Risk Factors: - Group verbal and written instruction with models to review the exercise physiology of the cardiovascular system and associated critical values. Details cardiovascular disease risk factors and the goals associated with each risk factor.   Cardiac Rehab from 12/16/2016 in [facility] Cardiac and Pulmonary Rehab  Date  11/11/16  Educator  RG/AS  Instruction Review Code  2- meets goals/outcomes      Aerobic Exercise & Resistance Training: - Gives group verbal and written discussion on the health impact of inactivity. On the components of aerobic and resistive training programs and the benefits of this training and how to safely progress through these programs.   Cardiac Rehab from 12/16/2016 in [facility] Cardiac and Pulmonary Rehab  Date  11/13/16  Educator  AS  Instruction Review Code  2- meets goals/outcomes      Flexibility, Balance, General Exercise Guidelines: - Provides group verbal and written instruction on the benefits of flexibility and balance training programs. Provides general exercise guidelines with specific guidelines to those with heart or lung disease. Demonstration and skill practice provided.   Cardiac Rehab from 12/16/2016 in [facility] Cardiac and Pulmonary Rehab  Date  11/18/16  Educator  AS  Instruction Review Code  2- meets goals/outcomes      Stress Management: - Provides group verbal and written instruction about the health risks of elevated stress, cause of high stress, and healthy ways to reduce stress.   Cardiac Rehab from 12/16/2016 in [facility] Cardiac and Pulmonary Rehab  Date  11/25/16  Educator  [facility]  Instruction Review Code  2- meets goals/outcomes       Depression: - Provides group verbal and written instruction on the correlation between heart/lung disease and depressed mood, treatment options, and the stigmas associated with seeking treatment.   Cardiac Rehab from 12/16/2016 in [facility] Cardiac and Pulmonary Rehab  Date  11/06/16  Educator  [facility]  Instruction Review Code  2- meets goals/outcomes      Anatomy & Physiology of the Heart: - Group verbal and written instruction and models provide basic cardiac anatomy and physiology, with the coronary electrical and arterial systems. Review of: AMI, Angina, Valve disease, Heart Failure, Cardiac Arrhythmia, Pacemakers, and the ICD.   Cardiac Rehab from 12/16/2016 in [facility] Cardiac and Pulmonary Rehab  Date  11/27/16  Educator  [facility]  Instruction Review Code  2- meets goals/outcomes      Cardiac Procedures: - Group verbal and written instruction and models to describe the testing methods done to diagnose heart disease. Reviews the outcomes of the test results. Describes the treatment choices: Medical Management, Angioplasty, or Coronary Bypass Surgery.   Cardiac Rehab from 12/16/2016 in [facility] Cardiac and Pulmonary Rehab  Date  12/02/16  Educator  CE  Instruction Review Code  2- meets  goals/outcomes      Cardiac Medications: - Group verbal and written instruction to review commonly prescribed medications for heart disease. Reviews the medication, class of the drug, and side effects. Includes the steps to properly store meds and maintain the prescription regimen.   Cardiac Rehab from 12/16/2016 in [facility] Cardiac and Pulmonary Rehab  Date  12/09/16 [8/7 Part One 8/9 Part Two]  Educator  SB  Instruction Review Code  2- meets goals/outcomes      Go Sex-Intimacy & Heart Disease, Get SMART - Goal Setting: - Group verbal and written instruction through game format to discuss heart disease and the return to sexual intimacy. Provides group verbal and written material to discuss and apply goal setting  through the application of the S.M.A.R.T. Method.   Cardiac Rehab from 12/16/2016 in [facility] Cardiac and Pulmonary Rehab  Date  12/02/16  Educator  CE  Instruction Review Code  2- meets goals/outcomes      Other Matters of the Heart: - Provides group verbal, written materials and models to describe Heart Failure, Angina, Valve Disease, and Diabetes in the realm of heart disease. Includes description of the disease process and treatment options available to the cardiac patient.   Cardiac Rehab from 12/16/2016 in [facility] Cardiac and Pulmonary Rehab  Date  11/27/16  Educator  [facility]  Instruction Review Code  2- meets goals/outcomes      Exercise & Equipment Safety: - Individual verbal instruction and demonstration of equipment use and safety with use of the equipment.   Cardiac Rehab from 12/16/2016 in [facility] Cardiac and Pulmonary Rehab  Date  10/27/16  Educator  SB  Instruction Review Code  2- meets goals/outcomes      Infection Prevention: - Provides verbal and written material to individual with discussion of infection control including proper hand washing and proper equipment cleaning during exercise session.   Cardiac Rehab from 12/16/2016 in [facility] Cardiac and Pulmonary Rehab  Date  10/27/16  Educator  SB  Instruction Review Code  2- meets goals/outcomes      Falls Prevention: - Provides verbal and written material to individual with discussion of falls prevention and safety.   Cardiac Rehab from 12/16/2016 in [facility] Cardiac and Pulmonary Rehab  Date  10/27/16  Educator  SB  Instruction Review Code  2- meets goals/outcomes      Diabetes: - Individual verbal and written instruction to review signs/symptoms of diabetes, desired ranges of glucose level fasting, after meals and with exercise. Advice that pre and post exercise glucose checks will be done for 3 sessions at entry of program.    Knowledge Questionnaire Score:     Knowledge Questionnaire Score - 10/27/16 1343       Knowledge Questionnaire Score   Pre Score 24/28  Reviewed correct responses with Andrew [name], He verbalized understanding of the reponses and had no questions      Core Components/Risk Factors/Patient Goals at Admission:     Personal Goals and Risk Factors at Admission - 10/27/16 1332      Core Components/Risk Factors/Patient Goals on Admission    Weight Management Yes;Weight Loss;Obesity   Intervention Weight Management: Develop a combined nutrition and exercise program designed to reach desired caloric intake, while maintaining appropriate intake of nutrient and fiber, sodium and fats, and appropriate energy expenditure required for the weight goal.;Weight Management: Provide education and appropriate resources to help participant work on and attain dietary goals.;Weight Management/Obesity: Establish reasonable short term and long term weight goals.;Obesity: Provide education and appropriate resources  to help participant work on and attain dietary goals.   Admit Weight 195 lb 3.2 oz (88.5 kg)   Goal Weight: Short Term 193 lb (87.5 kg)   Goal Weight: Long Term 174 lb (78.9 kg)   Expected Outcomes Short Term: Continue to assess and modify interventions until short term weight is achieved;Long Term: Adherence to nutrition and physical activity/exercise program aimed toward attainment of established weight goal;Weight Loss: Understanding of general recommendations for a balanced deficit meal plan, which promotes 1-2 lb weight loss per week and includes a negative energy balance of [phone] kcal/d;Understanding recommendations for meals to include 15-35% energy as protein, 25-35% energy from fat, 35-60% energy from carbohydrates, less than '200mg'$  of dietary cholesterol, 20-35 gm of total fiber daily;Understanding of distribution of calorie intake throughout the day with the consumption of 4-5 meals/snacks   Diabetes --  Erroneous charting: Does not have diabetes   Intervention Provide education about  signs/symptoms and action to take for hypo/hyperglycemia.;Provide education about proper nutrition, including hydration, and aerobic/resistive exercise prescription along with prescribed medications to achieve blood glucose in normal ranges: Fasting glucose 65-99 mg/dL   Expected Outcomes Short Term: Participant verbalizes understanding of the signs/symptoms and immediate care of hyper/hypoglycemia, proper foot care and importance of medication, aerobic/resistive exercise and nutrition plan for blood glucose control.;Long Term: Attainment of HbA1C < 7%.   Heart Failure Yes   Intervention Provide a combined exercise and nutrition program that is supplemented with education, support and counseling about heart failure. Directed toward relieving symptoms such as shortness of breath, decreased exercise tolerance, and extremity edema.   Expected Outcomes Improve functional capacity of life;Short term: Attendance in program 2-3 days a week with increased exercise capacity. Reported lower sodium intake. Reported increased fruit and vegetable intake. Reports medication compliance.;Short term: Daily weights obtained and reported for increase. Utilizing diuretic protocols set by physician.;Long term: Adoption of self-care skills and reduction of barriers for early signs and symptoms recognition and intervention leading to self-care maintenance.   Hypertension Yes   Intervention Provide education on lifestyle modifcations including regular physical activity/exercise, weight management, moderate sodium restriction and increased consumption of fresh fruit, vegetables, and low fat dairy, alcohol moderation, and smoking cessation.;Monitor prescription use compliance.   Expected Outcomes Short Term: Continued assessment and intervention until BP is < 140/79m HG in hypertensive participants. < 130/823mHG in hypertensive participants with diabetes, heart failure or chronic kidney disease.;Long Term: Maintenance of blood  pressure at goal levels.   Lipids Yes   Intervention Provide education and support for participant on nutrition & aerobic/resistive exercise along with prescribed medications to achieve LDL '70mg'$ , HDL >'40mg'$ .   Expected Outcomes Short Term: Participant states understanding of desired cholesterol values and is compliant with medications prescribed. Participant is following exercise prescription and nutrition guidelines.;Long Term: Cholesterol controlled with medications as prescribed, with individualized exercise RX and with personalized nutrition plan. Value goals: LDL < '70mg'$ , HDL > 40 mg.   Stress Yes  Fianacial and family. "Wife spends monet when she shouldn't"  and she has chronic illness, neither can cook meals at home, so have to pick up meals. Unable to do what he wants without needing to stop and rest. Wants to be able to do more .    Intervention Offer individual and/or small group education and counseling on adjustment to heart disease, stress management and health-related lifestyle change. Teach and support self-help strategies.;Refer participants experiencing significant psychosocial distress to appropriate mental health specialists for further evaluation and treatment. When  possible, include family members and significant others in education/counseling sessions.   Expected Outcomes Short Term: Participant demonstrates changes in health-related behavior, relaxation and other stress management skills, ability to obtain effective social support, and compliance with psychotropic medications if prescribed.;Long Term: Emotional wellbeing is indicated by absence of clinically significant psychosocial distress or social isolation.      Core Components/Risk Factors/Patient Goals Review:      Goals and Risk Factor Review    Row Name 11/27/16 0920             Core Components/Risk Factors/Patient Goals Review   Personal Goals Review Weight Management/Obesity;Hypertension;Lipids       Review Andrew [name]  has been doing well in rehab.  His weight was up 3 lbs today.  He was aysmptomatic.  He is trying to be good with his diet.  Andrew [name] has been [employer] well with his blood pressure and medications.       Expected Outcomes Short: Work on weight loss by increasing acitivity and watching diet.  Long: Continue to work on risk factor modifications.          Core Components/Risk Factors/Patient Goals at Discharge (Final Review):      Goals and Risk Factor Review - 11/27/16 0920      Core Components/Risk Factors/Patient Goals Review   Personal Goals Review Weight Management/Obesity;Hypertension;Lipids   Review Andrew [name] has been doing well in rehab.  His weight was up 3 lbs today.  He was aysmptomatic.  He is trying to be good with his diet.  Andrew [name] has been [employer] well with his blood pressure and medications.   Expected Outcomes Short: Work on weight loss by increasing acitivity and watching diet.  Long: Continue to work on risk factor modifications.      ITP Comments:     ITP Comments    Row Name 10/27/16 1323 11/19/16 0639 12/17/16 0612       ITP Comments Medical review completed. Initial ITP created.  DIagnosis documentation can be found in [facility]. [facility] note for Cardiac Rehab 30 day review. Continue with ITP unless directed changes per Medical Director review    30 day review. Continue with ITP unless directed changes per Medical Director review         Comments:

## 2016-12-18 DIAGNOSIS — I519 Heart disease, unspecified: Secondary | ICD-10-CM | POA: Diagnosis not present

## 2016-12-18 DIAGNOSIS — I5022 Chronic systolic (congestive) heart failure: Secondary | ICD-10-CM

## 2016-12-18 NOTE — Progress Notes (Signed)
Daily Session Note  Patient Details  Name: Andrew [name] MRN: [id number] Date of Birth: 04/06/1946 Referring Provider:     Cardiac Rehab from 10/27/2016 in [facility] Cardiac and Pulmonary Rehab  Referring Provider   [facility]      Encounter Date: 12/18/2016  Check In:     Session Check In - 12/18/16 0842      Check-In   Location [facility]-Cardiac & Pulmonary Rehab   Staff Present Jessica Hawkins, MA, ACSM RCEP, Exercise Physiologist;Amanda Sommer, BA, ACSM CEP, Exercise Physiologist;Meredith Craven, RN BSN   Supervising physician immediately available to respond to emergencies See telemetry face sheet for immediately available ER MD   Medication changes reported     No   Fall or balance concerns reported    No   Warm-up and Cool-down Performed on first and last piece of equipment   Resistance Training Performed Yes   VAD Patient? No     Pain Assessment   Currently in Pain? No/denies         History  Smoking Status  . Former Smoker  . Packs/day: 1.00  . Years: 50.00  . Types: Cigarettes  . Quit date: 05/05/2002  Smokeless Tobacco  . Never Used    Goals Met:  Independence with exercise equipment Exercise tolerated well No report of cardiac concerns or symptoms Strength training completed today  Goals Unmet:  Not Applicable  Comments: Pt able to follow exercise prescription today without complaint.  Will continue to monitor for progression.    Dr. Mark Miller is Medical Director for [facility] and [facility]. 

## 2016-12-23 ENCOUNTER — Encounter: Payer: Non-veteran care | Admitting: *Deleted

## 2016-12-23 VITALS — Ht 66.9 in | Wt 196.7 lb

## 2016-12-23 DIAGNOSIS — I5022 Chronic systolic (congestive) heart failure: Secondary | ICD-10-CM

## 2016-12-23 DIAGNOSIS — I519 Heart disease, unspecified: Secondary | ICD-10-CM | POA: Diagnosis not present

## 2016-12-23 NOTE — Progress Notes (Signed)
Daily Session Note  Patient Details  Name: Andrew [name] MRN: [id number] Date of Birth: 11-20-1945 Referring Provider:     Cardiac Rehab from 10/27/2016 in [facility] Cardiac and Pulmonary Rehab  Referring Provider  [facility]      Encounter Date: 12/23/2016  Check In:     Session Check In - 12/23/16 0814      Check-In   Location [facility]-Cardiac & Pulmonary Rehab   Staff Present Heath Lark, RN, BSN, CCRP;Jett Fukuda [place], MA, ACSM RCEP, Exercise Physiologist;Amanda Oletta Darter, BA, ACSM CEP, Exercise Physiologist   Supervising physician immediately available to respond to emergencies See telemetry face sheet for immediately available ER MD   Medication changes reported     No   Fall or balance concerns reported    No   Warm-up and Cool-down Performed on first and last piece of equipment   Resistance Training Performed Yes   VAD Patient? No     Pain Assessment   Currently in Pain? No/denies   Multiple Pain Sites No         History  Smoking Status  . Former Smoker  . Packs/day: 1.00  . Years: 50.00  . Types: Cigarettes  . Quit date: 05/05/2002  Smokeless Tobacco  . Never Used    Goals Met:  Independence with exercise equipment Exercise tolerated well No report of cardiac concerns or symptoms Strength training completed today  Goals Unmet:  Not Applicable  Comments: Pt able to follow exercise prescription today without complaint.  Will continue to monitor for progression.      [place] Name 10/27/16 1448 12/23/16 0935       6 Minute Walk   Phase Initial Discharge    Distance 955 feet 1130 feet    Distance % Change  - 18.3 %  175 ft    Walk Time 5.75 minutes 6 minutes    # of Rest Breaks 1  15 sec 0    MPH 1.88 2.14    METS 2 2.45    RPE 14 13    Perceived Dyspnea  1 1    VO2 Peak 6.99 8.59    Symptoms Yes (comment) Yes (comment)    Comments using cane, feels wobbly, hip pain 9/10, slightly SOB hip pain 7/10, slightly SOB    Resting HR 71 bpm  78 bpm    Resting BP 154/66 130/70    Max Ex. HR 89 bpm 110 bpm    Max Ex. BP 138/74 132/74    2 Minute Post BP 132/60  -        Dr. Emily Filbert is Medical Director for [facility] and [facility].

## 2016-12-25 ENCOUNTER — Encounter: Payer: Non-veteran care | Admitting: *Deleted

## 2016-12-25 DIAGNOSIS — I5022 Chronic systolic (congestive) heart failure: Secondary | ICD-10-CM

## 2016-12-25 DIAGNOSIS — I519 Heart disease, unspecified: Secondary | ICD-10-CM | POA: Diagnosis not present

## 2016-12-25 NOTE — Progress Notes (Signed)
Daily Session Note  Patient Details  Name: Andrew [name] MRN: [id number] Date of Birth: [birth year] Referring Provider:     Cardiac Rehab from 10/27/2016 in [facility] Cardiac and Pulmonary Rehab  Referring Provider  [facility]      Encounter Date: 12/25/2016  Check In:     Session Check In - 12/25/16 0919      Check-In   Location [facility]-Cardiac & Pulmonary Rehab   Staff Present Alberteen Sam, MA, ACSM RCEP, Exercise Physiologist;Meredith Sherryll Burger, RN Vickki Hearing, BA, ACSM CEP, Exercise Physiologist   Supervising physician immediately available to respond to emergencies See telemetry face sheet for immediately available ER MD   Medication changes reported     No   Fall or balance concerns reported    No   Warm-up and Cool-down Performed on first and last piece of equipment   Resistance Training Performed Yes   VAD Patient? No     Pain Assessment   Currently in Pain? No/denies   Multiple Pain Sites No           Exercise Prescription Changes - 12/24/16 1500      Response to Exercise   Blood Pressure (Admit) 130/70   Blood Pressure (Exercise) 132/74   Blood Pressure (Exit) 126/64   Heart Rate (Admit) 71 bpm   Heart Rate (Exercise) 116 bpm   Heart Rate (Exit) 72 bpm   Rating of Perceived Exertion (Exercise) 13   Symptoms none   Duration Continue with 45 min of aerobic exercise without signs/symptoms of physical distress.   Intensity THRR unchanged     Progression   Progression Continue to progress workloads to maintain intensity without signs/symptoms of physical distress.   Average METs 3.8     Resistance Training   Training Prescription Yes   Weight 4 lbs   Reps 10-15     Interval Training   Interval Training No     Treadmill   MPH 2.5   Grade 2   Minutes 15   METs 3.6     NuStep   Level 4   Minutes 15   METs 3.4     REL-XR   Level 5   Minutes 15   METs 4.4     Home Exercise Plan   Plans to continue exercise at Home (comment)  walk   Frequency Add 2 additional days to program exercise sessions.   Initial Home Exercises Provided 11/04/16      History  Smoking Status  . Former Smoker  . Packs/day: 1.00  . Years: 50.00  . Types: Cigarettes  . Quit date: 05/05/2002  Smokeless Tobacco  . Never Used    Goals Met:  Independence with exercise equipment Exercise tolerated well No report of cardiac concerns or symptoms Strength training completed today  Goals Unmet:  Not Applicable  Comments: Pt able to follow exercise prescription today without complaint.  Will continue to monitor for progression.    Dr. Emily Filbert is Medical Director for [place] and [facility].

## 2016-12-30 ENCOUNTER — Encounter: Payer: Non-veteran care | Admitting: *Deleted

## 2016-12-30 DIAGNOSIS — I5022 Chronic systolic (congestive) heart failure: Secondary | ICD-10-CM

## 2016-12-30 DIAGNOSIS — I519 Heart disease, unspecified: Secondary | ICD-10-CM | POA: Diagnosis not present

## 2016-12-30 NOTE — Progress Notes (Signed)
Daily Session Note  Patient Details  Name: Andrew [name] MRN: [id number] Date of Birth: 09-Oct-1945 Referring Provider:     Cardiac Rehab from 10/27/2016 in [facility]  Referring Provider  [facility]      Encounter Date: 12/30/2016  Check In:     Session Check In - 12/30/16 6789      Check-In   Location [facility]   Staff Present Nada Maclachlan, BA, ACSM CEP, Exercise Physiologist;Jessica Luan Pulling, MA, ACSM RCEP, Exercise Physiologist;Susanne Bice, RN, BSN, CCRP;Joseph [employer] physician immediately available to respond to emergencies See telemetry face sheet for immediately available ER MD   Medication changes reported     No   Fall or balance concerns reported    No   Warm-up and Cool-down Performed on first and last piece of equipment   Resistance Training Performed Yes   VAD Patient? No     Pain Assessment   Currently in Pain? No/denies   Multiple Pain Sites No         History  Smoking Status  . Former Smoker  . Packs/day: 1.00  . Years: 50.00  . Types: Cigarettes  . Quit date: 05/05/2002  Smokeless Tobacco  . Never Used    Goals Met:  Independence with exercise equipment Exercise tolerated well No report of cardiac concerns or symptoms Strength training completed today  Goals Unmet:  Not Applicable  Comments: Pt able to follow exercise prescription today without complaint.  Will continue to monitor for progression.   Dr. Emily Filbert is Medical Director for [place].

## 2016-12-30 NOTE — Patient Instructions (Signed)
Discharge Instructions  Patient Details  Name: Andrew [name] MRN: [id number] Date of Birth: 06-07-1945 Referring Provider:  Ronnie Doss, MD   Number of Visits: 104  Reason for Discharge:  Patient reached a stable level of exercise. Patient independent in their exercise. Patient has met program and personal goals.  Smoking History:  History  Smoking Status  . Former Smoker  . Packs/day: 1.00  . Years: 50.00  . Types: Cigarettes  . Quit date: 05/05/2002  Smokeless Tobacco  . Never Used    Diagnosis:  Heart failure, chronic systolic (HCC)  Initial Exercise Prescription:     Initial Exercise Prescription - 10/27/16 1400      Date of Initial Exercise RX and Referring Provider   Date 10/27/16   Referring Provider [facility]     Treadmill   MPH 1.3   Grade 0   Minutes 15   METs 2     NuStep   Level 1   SPM 80   Minutes 15   METs 2     REL-XR   Level 1   Speed 50   Minutes 15   METs 2     Prescription Details   Frequency (times per week) 2   Duration Progress to 45 minutes of aerobic exercise without signs/symptoms of physical distress     Intensity   THRR 40-80% of Max Heartrate 102-133   Ratings of Perceived Exertion 11-13   Perceived Dyspnea 0-4     Progression   Progression Continue to progress workloads to maintain intensity without signs/symptoms of physical distress.     Resistance Training   Training Prescription Yes   Weight 3 lbs      Discharge Exercise Prescription (Final Exercise Prescription Changes):     Exercise Prescription Changes - 12/24/16 1500      Response to Exercise   Blood Pressure (Admit) 130/70   Blood Pressure (Exercise) 132/74   Blood Pressure (Exit) 126/64   Heart Rate (Admit) 71 bpm   Heart Rate (Exercise) 116 bpm   Heart Rate (Exit) 72 bpm   Rating of Perceived Exertion (Exercise) 13   Symptoms none   Duration Continue with 45 min of aerobic exercise without signs/symptoms of physical distress.    Intensity THRR unchanged     Progression   Progression Continue to progress workloads to maintain intensity without signs/symptoms of physical distress.   Average METs 3.8     Resistance Training   Training Prescription Yes   Weight 4 lbs   Reps 10-15     Interval Training   Interval Training No     Treadmill   MPH 2.5   Grade 2   Minutes 15   METs 3.6     NuStep   Level 4   Minutes 15   METs 3.4     REL-XR   Level 5   Minutes 15   METs 4.4     Home Exercise Plan   Plans to continue exercise at Home (comment)  walk   Frequency Add 2 additional days to program exercise sessions.   Initial Home Exercises Provided 11/04/16      Functional Capacity:     6 Minute Walk    Row Name 10/27/16 1448 12/23/16 0935       6 Minute Walk   Phase Initial Discharge    Distance 955 feet 1130 feet    Distance % Change  - 18.3 %  175 ft    Walk Time  5.75 minutes 6 minutes    # of Rest Breaks 1  15 sec 0    MPH 1.88 2.14    METS 2 2.45    RPE 14 13    Perceived Dyspnea  1 1    VO2 Peak 6.99 8.59    Symptoms Yes (comment) Yes (comment)    Comments using cane, feels wobbly, hip pain 9/10, slightly SOB hip pain 7/10, slightly SOB    Resting HR 71 bpm 78 bpm    Resting BP 154/66 130/70    Max Ex. HR 89 bpm 110 bpm    Max Ex. BP 138/74 132/74    2 Minute Post BP 132/60  -       Quality of Life:     Quality of Life - 12/23/16 0818      Quality of Life Scores   Health/Function Pre 14.6 %   Health/Function Post 15.6 %   Health/Function % Change 6.85 %   Socioeconomic Pre 24.58 %   Socioeconomic Post 20.36 %   Socioeconomic % Change  -17.17 %   Psych/Spiritual Pre 22.29 %   Psych/Spiritual Post 24 %   Psych/Spiritual % Change 7.67 %   Family Pre 16.8 %   Family Post 18 %   Family % Change 7.14 %   GLOBAL Pre 18.38 %   GLOBAL Post 18.5 %   GLOBAL % Change 0.65 %      Personal Goals: Goals established at orientation with interventions provided to work  toward goal.     Personal Goals and Risk Factors at Admission - 10/27/16 1332      Core Components/Risk Factors/Patient Goals on Admission    Weight Management Yes;Weight Loss;Obesity   Intervention Weight Management: Develop a combined nutrition and exercise program designed to reach desired caloric intake, while maintaining appropriate intake of nutrient and fiber, sodium and fats, and appropriate energy expenditure required for the weight goal.;Weight Management: Provide education and appropriate resources to help participant work on and attain dietary goals.;Weight Management/Obesity: Establish reasonable short term and long term weight goals.;Obesity: Provide education and appropriate resources to help participant work on and attain dietary goals.   Admit Weight 195 lb 3.2 oz (88.5 kg)   Goal Weight: Short Term 193 lb (87.5 kg)   Goal Weight: Long Term 174 lb (78.9 kg)   Expected Outcomes Short Term: Continue to assess and modify interventions until short term weight is achieved;Long Term: Adherence to nutrition and physical activity/exercise program aimed toward attainment of established weight goal;Weight Loss: Understanding of general recommendations for a balanced deficit meal plan, which promotes 1-2 lb weight loss per week and includes a negative energy balance of [phone] kcal/d;Understanding recommendations for meals to include 15-35% energy as protein, 25-35% energy from fat, 35-60% energy from carbohydrates, less than 265m of dietary cholesterol, 20-35 gm of total fiber daily;Understanding of distribution of calorie intake throughout the day with the consumption of 4-5 meals/snacks   Diabetes --  Erroneous charting: Does not have diabetes   Intervention Provide education about signs/symptoms and action to take for hypo/hyperglycemia.;Provide education about proper nutrition, including hydration, and aerobic/resistive exercise prescription along with prescribed medications to achieve  blood glucose in normal ranges: Fasting glucose 65-99 mg/dL   Expected Outcomes Short Term: Participant verbalizes understanding of the signs/symptoms and immediate care of hyper/hypoglycemia, proper foot care and importance of medication, aerobic/resistive exercise and nutrition plan for blood glucose control.;Long Term: Attainment of HbA1C < 7%.   Heart Failure Yes  Intervention Provide a combined exercise and nutrition program that is supplemented with education, support and counseling about heart failure. Directed toward relieving symptoms such as shortness of breath, decreased exercise tolerance, and extremity edema.   Expected Outcomes Improve functional capacity of life;Short term: Attendance in program 2-3 days a week with increased exercise capacity. Reported lower sodium intake. Reported increased fruit and vegetable intake. Reports medication compliance.;Short term: Daily weights obtained and reported for increase. Utilizing diuretic protocols set by physician.;Long term: Adoption of self-care skills and reduction of barriers for early signs and symptoms recognition and intervention leading to self-care maintenance.   Hypertension Yes   Intervention Provide education on lifestyle modifcations including regular physical activity/exercise, weight management, moderate sodium restriction and increased consumption of fresh fruit, vegetables, and low fat dairy, alcohol moderation, and smoking cessation.;Monitor prescription use compliance.   Expected Outcomes Short Term: Continued assessment and intervention until BP is < 140/45m HG in hypertensive participants. < 130/861mHG in hypertensive participants with diabetes, heart failure or chronic kidney disease.;Long Term: Maintenance of blood pressure at goal levels.   Lipids Yes   Intervention Provide education and support for participant on nutrition & aerobic/resistive exercise along with prescribed medications to achieve LDL <7032mHDL >22m20m  Expected Outcomes Short Term: Participant states understanding of desired cholesterol values and is compliant with medications prescribed. Participant is following exercise prescription and nutrition guidelines.;Long Term: Cholesterol controlled with medications as prescribed, with individualized exercise RX and with personalized nutrition plan. Value goals: LDL < 70mg73mL > 40 mg.   Stress Yes  Fianacial and family. "Wife spends monet when she shouldn't"  and she has chronic illness, neither can cook meals at home, so have to pick up meals. Unable to do what he wants without needing to stop and rest. Wants to be able to do more .    Intervention Offer individual and/or small group education and counseling on adjustment to heart disease, stress management and health-related lifestyle change. Teach and support self-help strategies.;Refer participants experiencing significant psychosocial distress to appropriate mental health specialists for further evaluation and treatment. When possible, include family members and significant others in education/counseling sessions.   Expected Outcomes Short Term: Participant demonstrates changes in health-related behavior, relaxation and other stress management skills, ability to obtain effective social support, and compliance with psychotropic medications if prescribed.;Long Term: Emotional wellbeing is indicated by absence of clinically significant psychosocial distress or social isolation.       Personal Goals Discharge:     Goals and Risk Factor Review - 11/27/16 0920      Core Components/Risk Factors/Patient Goals Review   Personal Goals Review Weight Management/Obesity;Hypertension;Lipids   Review Ron has been doing well in rehab.  His weight was up 3 lbs today.  He was aysmptomatic.  He is trying to be good with his diet.  Ron has been dong [employer] with his blood pressure and medications.   Expected Outcomes Short: Work on weight loss by increasing acitivity  and watching diet.  Long: Continue to work on risk factor modifications.      Exercise Goals and Review:     Exercise Goals    Row Name 10/27/16 1452             Exercise Goals   Increase Physical Activity Yes       Intervention Provide advice, education, support and counseling about physical activity/exercise needs.;Develop an individualized exercise prescription for aerobic and resistive training based on initial evaluation findings, risk stratification, comorbidities  and participant's personal goals.       Expected Outcomes Achievement of increased cardiorespiratory fitness and enhanced flexibility, muscular endurance and strength shown through measurements of functional capacity and personal statement of participant.       Increase Strength and Stamina Yes       Intervention Provide advice, education, support and counseling about physical activity/exercise needs.;Develop an individualized exercise prescription for aerobic and resistive training based on initial evaluation findings, risk stratification, comorbidities and participant's personal goals.       Expected Outcomes Achievement of increased cardiorespiratory fitness and enhanced flexibility, muscular endurance and strength shown through measurements of functional capacity and personal statement of participant.          Nutrition & Weight - Outcomes:     Pre Biometrics - 10/27/16 1452      Pre Biometrics   Height 5' 6.9" (1.699 m)   Weight 195 lb 3.2 oz (88.5 kg)   Waist Circumference 40 inches   Hip Circumference 40.25 inches   Waist to Hip Ratio 0.99 %   BMI (Calculated) 30.7   Single Leg Stand 22.34 seconds         Post Biometrics - 12/23/16 0936       Post  Biometrics   Height 5' 6.9" (1.699 m)   Weight 196 lb 11.2 oz (89.2 kg)   Waist Circumference 39.5 inches   Hip Circumference 39.5 inches   Waist to Hip Ratio 1 %   BMI (Calculated) 30.91   Single Leg Stand 30 seconds      Nutrition:      Nutrition Therapy & Goals - 11/18/16 1159      Nutrition Therapy   Diet TLC   Protein (specify units) 80grams daily   Saturated Fats 13 max. grams   Sodium 1500 grams     Personal Nutrition Goals   Nutrition Goal Eat meals at home more often, to save both calories and money   Personal Goal #2 increase vegetables, eat 2 times a day with meals   Personal Goal #3 Decrease sugar-sweetened drinks -- sodas, sweet tea, ?lemonade   Comments Mr. Hilgers is unable to stand for long periods of time to cook, his wife cannot cook due to disability, so they eat out often. Discussed basic meal planning and options for easy balanced meals. Discussed importance of controlling sugar intake with elevated HbA1C, advised reducing sugar intake especially from beverages, he agrees. He would like to lose weight, and set goals to decrease calories from restaurant meals and sugary drinks.     Intervention Plan   Intervention Prescribe, educate and counsel regarding individualized specific dietary modifications aiming towards targeted core components such as weight, hypertension, lipid management, diabetes, heart failure and other comorbidities.;Nutrition handout(s) given to patient.   Expected Outcomes Short Term Goal: Understand basic principles of dietary content, such as calories, fat, sodium, cholesterol and nutrients.;Short Term Goal: A plan has been developed with personal nutrition goals set during dietitian appointment.;Long Term Goal: Adherence to prescribed nutrition plan.      Nutrition Discharge:     Nutrition Assessments - 12/23/16 0818      MEDFICTS Scores   Pre Score 48   Post Score 24   Score Difference -24      Education Questionnaire Score:     Knowledge Questionnaire Score - 12/23/16 0818      Knowledge Questionnaire Score   Pre Score 24/28   Post Score 23/28      Goals reviewed with patient;  copy given to patient.

## 2017-01-01 DIAGNOSIS — I519 Heart disease, unspecified: Secondary | ICD-10-CM | POA: Diagnosis not present

## 2017-01-01 DIAGNOSIS — I5022 Chronic systolic (congestive) heart failure: Secondary | ICD-10-CM

## 2017-01-01 NOTE — Progress Notes (Signed)
Daily Session Note  Patient Details  Name: Andrew [name] MRN: [id number] Date of Birth: [birth year] Referring Provider:     Cardiac Rehab from 10/27/2016 in [name]  Referring Provider  Andrew [name]      Encounter Date: 01/01/2017  Check In:     Session Check In - 01/01/17 0915      Check-In   Location [facility]   Staff Present Alberteen Sam, MA, ACSM RCEP, Exercise Physiologist;Hildred Pharo Oletta Darter, BA, ACSM CEP, Exercise Physiologist;Krista Frederico Hamman, RN BSN   Supervising physician immediately available to respond to emergencies See telemetry face sheet for immediately available ER MD   Medication changes reported     No   Fall or balance concerns reported    No   Warm-up and Cool-down Performed on first and last piece of equipment   Resistance Training Performed Yes   VAD Patient? No     Pain Assessment   Currently in Pain? No/denies         History  Smoking Status  . Former Smoker  . Packs/day: 1.00  . Years: 50.00  . Types: Cigarettes  . Quit date: 05/05/2002  Smokeless Tobacco  . Never Used    Goals Met:  Independence with exercise equipment Exercise tolerated well No report of cardiac concerns or symptoms Strength training completed today  Goals Unmet:  Not Applicable  Comments:  Andrew [name] graduated today from cardiac rehab with 36 sessions completed.  Details of the patient's exercise prescription and what he needs to do in order to continue the prescription and progress were discussed with patient.  Patient was given a copy of prescription and goals.  Patient verbalized understanding.  Andrew [name] plans to continue to exercise by possibly going with wife to [name].    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

## 2017-01-01 NOTE — Progress Notes (Signed)
Discharge Progress Report  Patient Details  Name: Andrew [name] MRN: [id number] Date of Birth: 25-Jul-1945 Referring Provider:     Cardiac Rehab from 10/27/2016 in [facility]  Referring Provider  [facility]       Number of Visits: 36/36  Reason for Discharge:  Patient reached a stable level of exercise. Patient independent in their exercise. Patient has met program and personal goals.  Smoking History:  History  Smoking Status  . Former Smoker  . Packs/day: 1.00  . Years: 50.00  . Types: Cigarettes  . Quit date: 05/05/2002  Smokeless Tobacco  . Never Used    Diagnosis:  Heart failure, chronic systolic (HCC)  ADL [facility]:   Initial Exercise Prescription:     Initial Exercise Prescription - 10/27/16 1400      Date of Initial Exercise RX and Referring Provider   Date 10/27/16   Referring Provider [facility]     Treadmill   MPH 1.3   Grade 0   Minutes 15   METs 2     NuStep   Level 1   SPM 80   Minutes 15   METs 2     REL-XR   Level 1   Speed 50   Minutes 15   METs 2     Prescription Details   Frequency (times per week) 2   Duration Progress to 45 minutes of aerobic exercise without signs/symptoms of physical distress     Intensity   THRR 40-80% of Max Heartrate 102-133   Ratings of Perceived Exertion 11-13   Perceived Dyspnea 0-4     Progression   Progression Continue to progress workloads to maintain intensity without signs/symptoms of physical distress.     Resistance Training   Training Prescription Yes   Weight 3 lbs      Discharge Exercise Prescription (Final Exercise Prescription Changes):     Exercise Prescription Changes - 12/24/16 1500      Response to Exercise   Blood Pressure (Admit) 130/70   Blood Pressure (Exercise) 132/74   Blood Pressure (Exit) 126/64   Heart Rate (Admit) 71 bpm   Heart Rate (Exercise) 116 bpm   Heart Rate (Exit) 72 bpm   Rating of Perceived Exertion (Exercise) 13   Symptoms  none   Duration Continue with 45 min of aerobic exercise without signs/symptoms of physical distress.   Intensity THRR unchanged     Progression   Progression Continue to progress workloads to maintain intensity without signs/symptoms of physical distress.   Average METs 3.8     Resistance Training   Training Prescription Yes   Weight 4 lbs   Reps 10-15     Interval Training   Interval Training No     Treadmill   MPH 2.5   Grade 2   Minutes 15   METs 3.6     NuStep   Level 4   Minutes 15   METs 3.4     REL-XR   Level 5   Minutes 15   METs 4.4     Home Exercise Plan   Plans to continue exercise at Home (comment)  walk   Frequency Add 2 additional days to program exercise sessions.   Initial Home Exercises Provided 11/04/16      Functional Capacity:     6 Minute Walk    Row Name 10/27/16 1448 12/23/16 0935       6 Minute Walk   Phase Initial Discharge  Distance 955 feet 1130 feet    Distance % Change  - 18.3 %  175 ft    Walk Time 5.75 minutes 6 minutes    # of Rest Breaks 1  15 sec 0    MPH 1.88 2.14    METS 2 2.45    RPE 14 13    Perceived Dyspnea  1 1    VO2 Peak 6.99 8.59    Symptoms Yes (comment) Yes (comment)    Comments using cane, feels wobbly, hip pain 9/10, slightly SOB hip pain 7/10, slightly SOB    Resting HR 71 bpm 78 bpm    Resting BP 154/66 130/70    Max Ex. HR 89 bpm 110 bpm    Max Ex. BP 138/74 132/74    2 Minute Post BP 132/60  -       Psychological, QOL, Others - Outcomes: PHQ 2/9: Depression screen [facility] 2/9 12/23/2016 10/27/2016  Decreased Interest 1 1  Down, Depressed, Hopeless 1 0  PHQ - 2 Score 2 1  Altered sleeping 0 1  Tired, decreased energy 0 3  Change in appetite 2 1  Feeling bad or failure about yourself  0 0  Trouble concentrating 1 1  Moving slowly or fidgety/restless 0 0  Suicidal thoughts 0 0  PHQ-9 Score 5 7  Difficult doing work/chores Not difficult at all Somewhat difficult    Quality of Life:      Quality of Life - 12/23/16 0818      Quality of Life Scores   Health/Function Pre 14.6 %   Health/Function Post 15.6 %   Health/Function % Change 6.85 %   Socioeconomic Pre 24.58 %   Socioeconomic Post 20.36 %   Socioeconomic % Change  -17.17 %   Psych/Spiritual Pre 22.29 %   Psych/Spiritual Post 24 %   Psych/Spiritual % Change 7.67 %   Family Pre 16.8 %   Family Post 18 %   Family % Change 7.14 %   GLOBAL Pre 18.38 %   GLOBAL Post 18.5 %   GLOBAL % Change 0.65 %      Personal Goals: Goals established at orientation with interventions provided to work toward goal.     Personal Goals and Risk Factors at Admission - 10/27/16 1332      Core Components/Risk Factors/Patient Goals on Admission    Weight Management Yes;Weight Loss;Obesity   Intervention Weight Management: Develop a combined nutrition and exercise program designed to reach desired caloric intake, while maintaining appropriate intake of nutrient and fiber, sodium and fats, and appropriate energy expenditure required for the weight goal.;Weight Management: Provide education and appropriate resources to help participant work on and attain dietary goals.;Weight Management/Obesity: Establish reasonable short term and long term weight goals.;Obesity: Provide education and appropriate resources to help participant work on and attain dietary goals.   Admit Weight 195 lb 3.2 oz (88.5 kg)   Goal Weight: Short Term 193 lb (87.5 kg)   Goal Weight: Long Term 174 lb (78.9 kg)   Expected Outcomes Short Term: Continue to assess and modify interventions until short term weight is achieved;Long Term: Adherence to nutrition and physical activity/exercise program aimed toward attainment of established weight goal;Weight Loss: Understanding of general recommendations for a balanced deficit meal plan, which promotes 1-2 lb weight loss per week and includes a negative energy balance of [phone] kcal/d;Understanding recommendations for meals to  include 15-35% energy as protein, 25-35% energy from fat, 35-60% energy from carbohydrates, less than 227m of  dietary cholesterol, 20-35 gm of total fiber daily;Understanding of distribution of calorie intake throughout the day with the consumption of 4-5 meals/snacks   Diabetes --  Erroneous charting: Does not have diabetes   Intervention Provide education about signs/symptoms and action to take for hypo/hyperglycemia.;Provide education about proper nutrition, including hydration, and aerobic/resistive exercise prescription along with prescribed medications to achieve blood glucose in normal ranges: Fasting glucose 65-99 mg/dL   Expected Outcomes Short Term: Participant verbalizes understanding of the signs/symptoms and immediate care of hyper/hypoglycemia, proper foot care and importance of medication, aerobic/resistive exercise and nutrition plan for blood glucose control.;Long Term: Attainment of HbA1C < 7%.   Heart Failure Yes   Intervention Provide a combined exercise and nutrition program that is supplemented with education, support and counseling about heart failure. Directed toward relieving symptoms such as shortness of breath, decreased exercise tolerance, and extremity edema.   Expected Outcomes Improve functional capacity of life;Short term: Attendance in program 2-3 days a week with increased exercise capacity. Reported lower sodium intake. Reported increased fruit and vegetable intake. Reports medication compliance.;Short term: Daily weights obtained and reported for increase. Utilizing diuretic protocols set by physician.;Long term: Adoption of self-care skills and reduction of barriers for early signs and symptoms recognition and intervention leading to self-care maintenance.   Hypertension Yes   Intervention Provide education on lifestyle modifcations including regular physical activity/exercise, weight management, moderate sodium restriction and increased consumption of fresh fruit,  vegetables, and low fat dairy, alcohol moderation, and smoking cessation.;Monitor prescription use compliance.   Expected Outcomes Short Term: Continued assessment and intervention until BP is < 140/35m HG in hypertensive participants. < 130/875mHG in hypertensive participants with diabetes, heart failure or chronic kidney disease.;Long Term: Maintenance of blood pressure at goal levels.   Lipids Yes   Intervention Provide education and support for participant on nutrition & aerobic/resistive exercise along with prescribed medications to achieve LDL <7098mHDL >84m95m Expected Outcomes Short Term: Participant states understanding of desired cholesterol values and is compliant with medications prescribed. Participant is following exercise prescription and nutrition guidelines.;Long Term: Cholesterol controlled with medications as prescribed, with individualized exercise RX and with personalized nutrition plan. Value goals: LDL < 70mg4mL > 40 mg.   Stress Yes  Fianacial and family. "Wife spends monet when she shouldn't"  and she has chronic illness, neither can cook meals at home, so have to pick up meals. Unable to do what he wants without needing to stop and rest. Wants to be able to do more .    Intervention Offer individual and/or small group education and counseling on adjustment to heart disease, stress management and health-related lifestyle change. Teach and support self-help strategies.;Refer participants experiencing significant psychosocial distress to appropriate mental health specialists for further evaluation and treatment. When possible, include family members and significant others in education/counseling sessions.   Expected Outcomes Short Term: Participant demonstrates changes in health-related behavior, relaxation and other stress management skills, ability to obtain effective social support, and compliance with psychotropic medications if prescribed.;Long Term: Emotional wellbeing is  indicated by absence of clinically significant psychosocial distress or social isolation.       Personal Goals Discharge:     Goals and Risk Factor Review    Row Name 11/27/16 0920             Core Components/Risk Factors/Patient Goals Review   Personal Goals Review Weight Management/Obesity;Hypertension;Lipids       Review Ron has been doing well in rehab.  His  weight was up 3 lbs today.  He was aysmptomatic.  He is trying to be good with his diet.  Ron has been [employer] well with his blood pressure and medications.       Expected Outcomes Short: Work on weight loss by increasing acitivity and watching diet.  Long: Continue to work on risk factor modifications.          Exercise Goals and Review:     Exercise Goals    Row Name 10/27/16 1452             Exercise Goals   Increase Physical Activity Yes       Intervention Provide advice, education, support and counseling about physical activity/exercise needs.;Develop an individualized exercise prescription for aerobic and resistive training based on initial evaluation findings, risk stratification, comorbidities and participant's personal goals.       Expected Outcomes Achievement of increased cardiorespiratory fitness and enhanced flexibility, muscular endurance and strength shown through measurements of functional capacity and personal statement of participant.       Increase Strength and Stamina Yes       Intervention Provide advice, education, support and counseling about physical activity/exercise needs.;Develop an individualized exercise prescription for aerobic and resistive training based on initial evaluation findings, risk stratification, comorbidities and participant's personal goals.       Expected Outcomes Achievement of increased cardiorespiratory fitness and enhanced flexibility, muscular endurance and strength shown through measurements of functional capacity and personal statement of participant.          Nutrition  & Weight - Outcomes:     Pre Biometrics - 10/27/16 1452      Pre Biometrics   Height 5' 6.9" (1.699 m)   Weight 195 lb 3.2 oz (88.5 kg)   Waist Circumference 40 inches   Hip Circumference 40.25 inches   Waist to Hip Ratio 0.99 %   BMI (Calculated) 30.7   Single Leg Stand 22.34 seconds         Post Biometrics - 12/23/16 0936       Post  Biometrics   Height 5' 6.9" (1.699 m)   Weight 196 lb 11.2 oz (89.2 kg)   Waist Circumference 39.5 inches   Hip Circumference 39.5 inches   Waist to Hip Ratio 1 %   BMI (Calculated) 30.91   Single Leg Stand 30 seconds      Nutrition:     Nutrition Therapy & Goals - 11/18/16 1159      Nutrition Therapy   Diet TLC   Protein (specify units) 80grams daily   Saturated Fats 13 max. grams   Sodium 1500 grams     Personal Nutrition Goals   Nutrition Goal Eat meals at home more often, to save both calories and money   Personal Goal #2 increase vegetables, eat 2 times a day with meals   Personal Goal #3 Decrease sugar-sweetened drinks -- sodas, sweet tea, ?lemonade   Comments Mr. Hegner is unable to stand for long periods of time to cook, his wife cannot cook due to disability, so they eat out often. Discussed basic meal planning and options for easy balanced meals. Discussed importance of controlling sugar intake with elevated HbA1C, advised reducing sugar intake especially from beverages, he agrees. He would like to lose weight, and set goals to decrease calories from restaurant meals and sugary drinks.     Intervention Plan   Intervention Prescribe, educate and counsel regarding individualized specific dietary modifications aiming towards targeted core components such as  weight, hypertension, lipid management, diabetes, heart failure and other comorbidities.;Nutrition handout(s) given to patient.   Expected Outcomes Short Term Goal: Understand basic principles of dietary content, such as calories, fat, sodium, cholesterol and nutrients.;Short  Term Goal: A plan has been developed with personal nutrition goals set during dietitian appointment.;Long Term Goal: Adherence to prescribed nutrition plan.      Nutrition Discharge:     Nutrition Assessments - 12/23/16 0818      MEDFICTS Scores   Pre Score 48   Post Score 24   Score Difference -24      Education Questionnaire Score:     Knowledge Questionnaire Score - 12/23/16 0818      Knowledge Questionnaire Score   Pre Score 24/28   Post Score 23/28      Goals reviewed with patient; copy given to patient.

## 2017-01-01 NOTE — Progress Notes (Signed)
Cardiac Individual Treatment Plan  Patient Details  Name: Andrew [name] MRN: [id number] Date of Birth: 1946/02/05 Referring Provider:     Cardiac Rehab from 10/27/2016 in [facility] Cardiac and Pulmonary Rehab  Referring Provider  [facility]      Initial Encounter Date:    Cardiac Rehab from 10/27/2016 in [facility] Cardiac and Pulmonary Rehab  Date  10/27/16  Referring Provider  [facility]      Visit Diagnosis: Heart failure, chronic systolic ([place])  Patient's Home Medications on Admission:  Current Outpatient Prescriptions:  .  acetaminophen (TYLENOL) 325 MG tablet, Take 975 mg by mouth 3 (three) times daily. , Disp: , Rfl:  .  aspirin EC 81 MG tablet, Take 81 mg by mouth at bedtime., Disp: , Rfl:  .  carvedilol (COREG) 12.5 MG tablet, Take 12.5 mg by mouth 2 (two) times daily with a meal.  , Disp: , Rfl:  .  cefUROXime (CEFTIN) 500 MG tablet, Take 1 tablet (500 mg total) by mouth 2 (two) times daily with a meal., Disp: 4 tablet, Rfl: 0 .  citalopram (CELEXA) 20 MG tablet, Take 20 mg by mouth at bedtime., Disp: , Rfl:  .  clopidogrel (PLAVIX) 75 MG tablet, Take 75 mg by mouth at bedtime., Disp: , Rfl:  .  finasteride (PROSCAR) 5 MG tablet, Take 5 mg by mouth daily., Disp: , Rfl:  .  fluconazole (DIFLUCAN) 200 MG tablet, Take 200 mg by mouth daily., Disp: , Rfl:  .  furosemide (LASIX) 20 MG tablet, Take 20 mg by mouth at bedtime., Disp: , Rfl:  .  gabapentin (NEURONTIN) 400 MG capsule, Take 1,200 mg by mouth 3 (three) times daily., Disp: , Rfl:  .  hydrocerin (EUCERIN) CREA, Apply 1 application topically 2 (two) times daily as needed (for irritation)., Disp: , Rfl:  .  hydrocortisone 2.5 % ointment, Apply 1 application topically 2 (two) times daily as needed (for itching)., Disp: , Rfl:  .  isosorbide mononitrate (IMDUR) 30 MG 24 hr tablet, Take 30 mg by mouth daily as needed (for increased BP)., Disp: , Rfl:  .  lisinopril (PRINIVIL,ZESTRIL) 10 MG tablet, Take 10 mg by mouth daily., Disp: ,  Rfl:  .  Menthol-Methyl Salicylate (THERA-GESIC EX), Apply 1 application topically 3 (three) times daily as needed (for pain)., Disp: , Rfl:  .  methocarbamol (ROBAXIN) 750 MG tablet, Take 750 mg by mouth 2 (two) times daily as needed for muscle spasms., Disp: , Rfl:  .  metoprolol succinate (TOPROL-XL) 25 MG 24 hr tablet, Take 25 mg by mouth daily., Disp: , Rfl:  .  mexiletine (MEXITIL) 250 MG capsule, Take 250 mg by mouth 2 (two) times daily.  , Disp: , Rfl:  .  niacin 500 MG tablet, Take 500 mg by mouth at bedtime., Disp: , Rfl:  .  nitroGLYCERIN (NITROSTAT) 0.4 MG SL tablet, Place 0.4 mg under the tongue every 5 (five) minutes as needed for chest pain. , Disp: , Rfl:  .  Omega-3 Fatty Acids (FISH OIL) 1000 MG CAPS, Take 1,000 mg by mouth at bedtime., Disp: , Rfl:  .  omeprazole (PRILOSEC) 20 MG capsule, Take 40 mg by mouth 2 (two) times daily before a meal., Disp: , Rfl:  .  quiniDINE gluconate 324 MG CR tablet, Take 324 mg by mouth 2 (two) times daily., Disp: , Rfl:  .  simvastatin (ZOCOR) 40 MG tablet, Take 40 mg by mouth at bedtime., Disp: , Rfl:  .  Tamsulosin HCl (  FLOMAX) 0.4 MG CAPS, Take 0.4 mg by mouth 2 (two) times daily. , Disp: , Rfl:   Past Medical History: Past Medical History:  Diagnosis Date  . Atrial fibrillation ([place])   . CAD (coronary artery disease)    a. s/p CABG 1997;  b. s/p PCI to [facility] 2006;  c. s/p BMS x 3 to S-RCA 10/10;  d.  Myoview 2010: EF 24%, basal inf, mid inf. apical inf, basal inf-lat and mid IL scar, no isch;  e. cath 1/11: S-RCA occluded, S-OM chronically occluded, L-LAD ok with L-R collaterals (med. Tx)  . HLD (hyperlipidemia)   . HTN (hypertension)   . Ischemic cardiomyopathy    a. EF 25-35%;   b. echo 3/11: EF 35%, mild LVH, severe inf/post HK, lat and apical HK, grade 1 diast dysfxn, mild AI, mild to mod MR, PASP 32  . Systolic CHF, chronic ([place])   . Ventricular tachycardia ([place])    a. s/p AICD 2006;   b. Amio intolerant due to liver and thyroid  issues;   c. 2/12:  quinidine added to mexilitine  (consider Tikosyn in future??)    Tobacco Use: History  Smoking Status  . Former Smoker  . Packs/day: 1.00  . Years: 50.00  . Types: Cigarettes  . Quit date: 05/05/2002  Smokeless Tobacco  . Never Used    Labs: Recent Review Flowsheet Data    Labs for ITP Cardiac and Pulmonary Rehab Latest Ref Rng & Units 05/06/2009 05/07/2009 10/15/2009 01/08/2010 05/29/2010   Cholestrol 0 - 200 mg/dL - 129 ATP III CLASSIFICATION: <200     mg/dL   Desirable 200-239  mg/dL   Borderline High >=240    mg/dL   High 253(H) 106 -   LDLCALC 0 - 99 mg/dL - 52 Total Cholesterol/HDL:CHD Risk Coronary Heart Disease Risk Table Men   Women 1/2 Average Risk   3.4   3.3 Average Risk       5.0   4.4 2 X Average Risk   9.6   7.1 3 X Average Risk  23.4   11.0 Use the calculated Patient Ratio above and the CHD Risk Table to determine the patient's CHD Risk. ATP III CLASSIFICATION (LDL): <100     mg/dL   Optimal 100-129  mg/dL   Near or Above Optimal 130-159  mg/dL   Borderline 160-189  mg/dL   High >190     mg/dL   Very High 156(H) 33 -   HDL >39 mg/dL - 38(L) 34(L) 35(L) -   Trlycerides <150 mg/dL - 194(H) 317(H) 188(H) -   Hemoglobin A1c 4.6 - 6.1 % 6.2 (NOTE) The ADA recommends the following therapeutic goal for glycemic control related to Hgb A1c measurement: Goal of therapy: <6.5 Hgb A1c  Reference: American Diabetes Association: Clinical Practice Recommendations 2010, Diabetes Care, 2010, 33: (Suppl 1).(H) - - - -   TCO2 0 - 100 mmol/L - - - - 29       Exercise Target Goals:    Exercise Program Goal: Individual exercise prescription set with THRR, safety & activity barriers. Participant demonstrates ability to understand and report RPE using BORG scale, to self-measure pulse accurately, and to acknowledge the importance of the exercise prescription.  Exercise Prescription Goal: Starting with aerobic activity 30 plus minutes a day, 3 days per week  for initial exercise prescription. Provide home exercise prescription and guidelines that participant acknowledges understanding prior to discharge.  Activity Barriers & Risk Stratification:     Activity Barriers &  Cardiac Risk Stratification - 10/27/16 1054      Activity Barriers & Cardiac Risk Stratification   Activity Barriers Arthritis;Joint Problems;Shortness of Breath;Back Problems;Neck/Spine Problems;Muscular Weakness;History of Falls;Balance Concerns;Assistive Device;Deconditioning  bilateral hip pain, neck pain occasionally, compressed disc L5-S1   Cardiac Risk Stratification High      6 Minute Walk:     6 Minute Walk    Row Name 10/27/16 1448 12/23/16 0935       6 Minute Walk   Phase Initial Discharge    Distance 955 feet 1130 feet    Distance % Change  - 18.3 %  175 ft    Walk Time 5.75 minutes 6 minutes    # of Rest Breaks 1  15 sec 0    MPH 1.88 2.14    METS 2 2.45    RPE 14 13    Perceived Dyspnea  1 1    VO2 Peak 6.99 8.59    Symptoms Yes (comment) Yes (comment)    Comments using cane, feels wobbly, hip pain 9/10, slightly SOB hip pain 7/10, slightly SOB    Resting HR 71 bpm 78 bpm    Resting BP 154/66 130/70    Max Ex. HR 89 bpm 110 bpm    Max Ex. BP 138/74 132/74    2 Minute Post BP 132/60  -       Oxygen Initial Assessment:   Oxygen Re-Evaluation:   Oxygen Discharge (Final Oxygen Re-Evaluation):   Initial Exercise Prescription:     Initial Exercise Prescription - 10/27/16 1400      Date of Initial Exercise RX and Referring Provider   Date 10/27/16   Referring Provider [facility]     Treadmill   MPH 1.3   Grade 0   Minutes 15   METs 2     NuStep   Level 1   SPM 80   Minutes 15   METs 2     REL-XR   Level 1   Speed 50   Minutes 15   METs 2     Prescription Details   Frequency (times per week) 2   Duration Progress to 45 minutes of aerobic exercise without signs/symptoms of physical distress     Intensity   THRR  40-80% of Max Heartrate 102-133   Ratings of Perceived Exertion 11-13   Perceived Dyspnea 0-4     Progression   Progression Continue to progress workloads to maintain intensity without signs/symptoms of physical distress.     Resistance Training   Training Prescription Yes   Weight 3 lbs      Perform Capillary Blood Glucose checks as needed.  Exercise Prescription Changes:     Exercise Prescription Changes    Row Name 10/27/16 1300 10/28/16 1300 11/04/16 1000 11/12/16 1400 11/26/16 1300     Response to Exercise   Blood Pressure (Admit) 154/66 134/70  - 112/66 122/60   Blood Pressure (Exercise) 138/74 130/66  - 136/72 136/66   Blood Pressure (Exit) 132/60 98/60  - 104/60 116/62   Heart Rate (Admit) 71 bpm 77 bpm  - 75 bpm 76 bpm   Heart Rate (Exercise) 88 bpm 99 bpm  - 107 bpm 120 bpm   Heart Rate (Exit) 75 bpm 73 bpm  - 73 bpm 72 bpm   Oxygen Saturation (Admit) 96 %  -  -  -  -   Oxygen Saturation (Exercise) 97 %  -  -  -  -   Rating of Perceived  Exertion (Exercise) 14 13  - 11 13   Perceived Dyspnea (Exercise) 1  -  -  -  -   Symptoms hip pain (9/10), slightly SOB, using cane, feels wobbly none  - none none   Comments walk test results first full day of exercise  -  -  -   Duration  - Progress to 45 minutes of aerobic exercise without signs/symptoms of physical distress  - Progress to 45 minutes of aerobic exercise without signs/symptoms of physical distress Continue with 45 min of aerobic exercise without signs/symptoms of physical distress.   Intensity  - THRR unchanged  - THRR unchanged THRR unchanged     Progression   Progression  - Continue to progress workloads to maintain intensity without signs/symptoms of physical distress.  - Continue to progress workloads to maintain intensity without signs/symptoms of physical distress. Continue to progress workloads to maintain intensity without signs/symptoms of physical distress.   Average METs  - 2.25  - 3.65 4.07      Resistance Training   Training Prescription  - Yes  - Yes Yes   Weight  - 3 lbs  - 3 lb 4 lbs   Reps  - 10-15  - 10-15 10-15     Interval Training   Interval Training  - No  - No No     Treadmill   MPH  -  -  -  - 2.5   Grade  -  -  -  - 2   Minutes  -  -  -  - 15   METs  -  -  -  - 3.6     NuStep   Level  - 1  - 1 4   Minutes  - 15  - 15 15   METs  - 1.9  - 3.2 3.4     REL-XR   Level  - 1  - 1 5   Speed  - 50  -  -  -   Minutes  - 15  - 15 15   METs  - 2.6  - 4.1 5.2     Home Exercise Plan   Plans to continue exercise at  -  - Home (comment)  walk  -  -   Frequency  -  - Add 2 additional days to program exercise sessions.  -  -   Initial Home Exercises Provided  -  - 11/04/16  -  -   [place] Name 12/11/16 1500 12/24/16 1500           Response to Exercise   Blood Pressure (Admit) 116/64 130/70      Blood Pressure (Exercise) 138/82 132/74      Blood Pressure (Exit) 128/70 126/64      Heart Rate (Admit) 75 bpm 71 bpm      Heart Rate (Exercise) 113 bpm 116 bpm      Heart Rate (Exit) 81 bpm 72 bpm      Rating of Perceived Exertion (Exercise) 13 13      Symptoms none none      Duration Continue with 45 min of aerobic exercise without signs/symptoms of physical distress. Continue with 45 min of aerobic exercise without signs/symptoms of physical distress.      Intensity THRR unchanged THRR unchanged        Progression   Progression Continue to progress workloads to maintain intensity without signs/symptoms of physical distress. Continue to progress workloads to maintain intensity without signs/symptoms  of physical distress.      Average METs 4.47 3.8        Resistance Training   Training Prescription Yes Yes      Weight 4 lbs 4 lbs      Reps 10-15 10-15        Interval Training   Interval Training No No        Treadmill   MPH 2.5 2.5      Grade 2 2      Minutes 15 15      METs 3.6 3.6        NuStep   Level 4 4      Minutes 15 15      METs 4.4 3.4         REL-XR   Level 5 5      Minutes 15 15      METs 5.4 4.4        Home Exercise Plan   Plans to continue exercise at Home (comment)  walk Home (comment)  walk      Frequency Add 2 additional days to program exercise sessions. Add 2 additional days to program exercise sessions.      Initial Home Exercises Provided 11/04/16 11/04/16         Exercise Comments:     Exercise Comments    Row Name 10/28/16 1048 01/01/17 1009         Exercise Comments First full day of exercise!  Patient was oriented to gym and equipment including functions, settings, policies, and procedures.  Patient's individual exercise prescription and treatment plan were reviewed.  All starting workloads were established based on the results of the 6 minute walk test done at initial orientation visit.  The plan for exercise progression was also introduced and progression will be customized based on patient's performance and goals. Zeeshan graduated today from cardiac rehab with 36 sessions completed.  Details of the patient's exercise prescription and what he needs to do in order to continue the prescription and progress were discussed with patient.  Patient was given a copy of prescription and goals.  Patient verbalized understanding.  Felix plans to continue to exercise by possibly going with wife to [employer].         Exercise Goals and Review:     Exercise Goals    Row Name 10/27/16 1452             Exercise Goals   Increase Physical Activity Yes       Intervention Provide advice, education, support and counseling about physical activity/exercise needs.;Develop an individualized exercise prescription for aerobic and resistive training based on initial evaluation findings, risk stratification, comorbidities and participant's personal goals.       Expected Outcomes Achievement of increased cardiorespiratory fitness and enhanced flexibility, muscular endurance and strength shown through measurements of  functional capacity and personal statement of participant.       Increase Strength and Stamina Yes       Intervention Provide advice, education, support and counseling about physical activity/exercise needs.;Develop an individualized exercise prescription for aerobic and resistive training based on initial evaluation findings, risk stratification, comorbidities and participant's personal goals.       Expected Outcomes Achievement of increased cardiorespiratory fitness and enhanced flexibility, muscular endurance and strength shown through measurements of functional capacity and personal statement of participant.          Exercise Goals Re-Evaluation :     Exercise Goals Re-Evaluation  [place] Name 10/28/16 1311 11/04/16 1018 11/12/16 1448 11/26/16 1350 11/27/16 0917     Exercise Goal Re-Evaluation   Exercise Goals Review Increase Physical Activity;Increase Strenth and Stamina Increase Physical Activity;Increase Strenth and Stamina Increase Physical Activity;Increase Strenth and Stamina Increase Physical Activity;Increase Strenth and Stamina Increase Physical Activity;Increase Strenth and Stamina   Comments Ron completed his first day of exercise today!  He did well on his thirty minutes of exericse. We will continue to monitor his progression.  Reviewed home exercise with pt today.  Pt plans to walk for exercise.  Reviewed THR, pulse, RPE, sign and symptoms, NTG use, and when to call 911 or MD.  Also discussed weather considerations and indoor options.  Pt voiced understanding. Ron is progressing well with exercise and has increased his average MET level. Ron continues to do well in rehab.  He is now up to level 5 on the XR.  He alternates using his arms, legs, and both.  We will continue to monitor his progression. Ron is walking with his dogs at home for 19mn every day.  He is also playing golf and working out in the yard more.  He still wants to lose weight so we talked about increasing the time he  spends walking the dogs as a way to burn more calories.   Expected Outcomes Short and Long: Continue to come to classes to work on increasing physical activity. Short - begin home exercise Long - Continue to exercise independently Short - Ron willl attend Cardiac Rehab regularly and continue to progress.  Long - Ron will coontinue to exercise independently. Short: Ron will continue to try to increase his workloads.  Long: Ron will continue to exercise independently Short: Increase time walking dogs.  Long: Continue to exercise independently.   RLowndesName 12/11/16 1508 12/23/16 0938 12/24/16 1501         Exercise Goal Re-Evaluation   Exercise Goals Review Increase Physical Activity;Increase Strenth and Stamina Increase Physical Activity;Increase Strenth and Stamina Increase Physical Activity;Increase Strenth and Stamina     Comments Ron has been doing well in rehab.  He still likes to alternate extremities on the XR to work all the muscle groups.  We will continue to monitor her progression.  Ron improved his walk test by 175 ft!! Ron has done well in rehab.  He is nearing graduation already!  He will be graduating next week.   He wants to dance again on his last day of rehab.  We will continue to monitor his progress.     Expected Outcomes Short: Increase incline on treadmill.  Long: Continue to exercise independently  - Short: Dance at graduation.  Long: Continue to exercise on his own by walking at home.        Discharge Exercise Prescription (Final Exercise Prescription Changes):     Exercise Prescription Changes - 12/24/16 1500      Response to Exercise   Blood Pressure (Admit) 130/70   Blood Pressure (Exercise) 132/74   Blood Pressure (Exit) 126/64   Heart Rate (Admit) 71 bpm   Heart Rate (Exercise) 116 bpm   Heart Rate (Exit) 72 bpm   Rating of Perceived Exertion (Exercise) 13   Symptoms none   Duration Continue with 45 min of aerobic exercise without signs/symptoms of physical  distress.   Intensity THRR unchanged     Progression   Progression Continue to progress workloads to maintain intensity without signs/symptoms of physical distress.   Average METs 3.8  Resistance Training   Training Prescription Yes   Weight 4 lbs   Reps 10-15     Interval Training   Interval Training No     Treadmill   MPH 2.5   Grade 2   Minutes 15   METs 3.6     NuStep   Level 4   Minutes 15   METs 3.4     REL-XR   Level 5   Minutes 15   METs 4.4     Home Exercise Plan   Plans to continue exercise at Home (comment)  walk   Frequency Add 2 additional days to program exercise sessions.   Initial Home Exercises Provided 11/04/16      Nutrition:  Target Goals: Understanding of nutrition guidelines, daily intake of sodium <1525m, cholesterol <2065m calories 30% from fat and 7% or less from saturated fats, daily to have 5 or more servings of fruits and vegetables.  Biometrics:     Pre Biometrics - 10/27/16 1452      Pre Biometrics   Height 5' 6.9" (1.699 m)   Weight 195 lb 3.2 oz (88.5 kg)   Waist Circumference 40 inches   Hip Circumference 40.25 inches   Waist to Hip Ratio 0.99 %   BMI (Calculated) 30.7   Single Leg Stand 22.34 seconds         Post Biometrics - 12/23/16 0936       Post  Biometrics   Height 5' 6.9" (1.699 m)   Weight 196 lb 11.2 oz (89.2 kg)   Waist Circumference 39.5 inches   Hip Circumference 39.5 inches   Waist to Hip Ratio 1 %   BMI (Calculated) 30.91   Single Leg Stand 30 seconds      Nutrition Therapy Plan and Nutrition Goals:     Nutrition Therapy & Goals - 11/18/16 1159      Nutrition Therapy   Diet TLC   Protein (specify units) 80grams daily   Saturated Fats 13 max. grams   Sodium 1500 grams     Personal Nutrition Goals   Nutrition Goal Eat meals at home more often, to save both calories and money   Personal Goal #2 increase vegetables, eat 2 times a day with meals   Personal Goal #3 Decrease  sugar-sweetened drinks -- sodas, sweet tea, ?lemonade   Comments Mr. AlTabberts unable to stand for long periods of time to cook, his wife cannot cook due to disability, so they eat out often. Discussed basic meal planning and options for easy balanced meals. Discussed importance of controlling sugar intake with elevated HbA1C, advised reducing sugar intake especially from beverages, he agrees. He would like to lose weight, and set goals to decrease calories from restaurant meals and sugary drinks.     Intervention Plan   Intervention Prescribe, educate and counsel regarding individualized specific dietary modifications aiming towards targeted core components such as weight, hypertension, lipid management, diabetes, heart failure and other comorbidities.;Nutrition handout(s) given to patient.   Expected Outcomes Short Term Goal: Understand basic principles of dietary content, such as calories, fat, sodium, cholesterol and nutrients.;Short Term Goal: A plan has been developed with personal nutrition goals set during dietitian appointment.;Long Term Goal: Adherence to prescribed nutrition plan.      Nutrition Discharge: Rate Your Plate Scores:     Nutrition Assessments - 12/23/16 0818      MEDFICTS Scores   Pre Score 48   Post Score 24   Score Difference -24  Nutrition Goals Re-Evaluation:     Nutrition Goals Re-Evaluation    [place] Name 11/27/16 0935             Goals   Current Weight 197 lb (89.4 kg)       Nutrition Goal Eat meals at home more often, to save both calories and money, eat more vegetables, decrease sugary drinks       Comment Ron continues to eat out for most meals since his wife does not cook.  He is starting to like salads more and is eating more vegetables than he ever used to.  He has also cut out most of his sugary drinks and sticks primarily to water and coffee.  We did talk about trying to make sure he is eating a good salad, with nutritious greens,  and not too  much dressing.       Expected Outcome Short: Trade out iceberg lettuce for a different green.  Long: Try to eat more at home.          Nutrition Goals Discharge (Final Nutrition Goals Re-Evaluation):     Nutrition Goals Re-Evaluation - 11/27/16 0935      Goals   Current Weight 197 lb (89.4 kg)   Nutrition Goal Eat meals at home more often, to save both calories and money, eat more vegetables, decrease sugary drinks   Comment Ron continues to eat out for most meals since his wife does not cook.  He is starting to like salads more and is eating more vegetables than he ever used to.  He has also cut out most of his sugary drinks and sticks primarily to water and coffee.  We did talk about trying to make sure he is eating a good salad, with nutritious greens,  and not too much dressing.   Expected Outcome Short: Trade out iceberg lettuce for a different green.  Long: Try to eat more at home.      Psychosocial: Target Goals: Acknowledge presence or absence of significant depression and/or stress, maximize coping skills, provide positive support system. Participant is able to verbalize types and ability to use techniques and skills needed for reducing stress and depression.   Initial Review & Psychosocial Screening:     Initial Psych Review & Screening - 10/27/16 1338      Initial Review   Source of Stress Concerns Chronic Illness;Family;Financial;Unable to perform yard/household activities   Comments Stress with wife spending money , stress with inablility to perform chores without stopping and resting all the time.       Quality of Life Scores:      Quality of Life - 12/23/16 0818      Quality of Life Scores   Health/Function Pre 14.6 %   Health/Function Post 15.6 %   Health/Function % Change 6.85 %   Socioeconomic Pre 24.58 %   Socioeconomic Post 20.36 %   Socioeconomic % Change  -17.17 %   Psych/Spiritual Pre 22.29 %   Psych/Spiritual Post 24 %   Psych/Spiritual % Change  7.67 %   Family Pre 16.8 %   Family Post 18 %   Family % Change 7.14 %   GLOBAL Pre 18.38 %   GLOBAL Post 18.5 %   GLOBAL % Change 0.65 %      PHQ-9: Recent Review Flowsheet Data    Depression screen [facility] 2/9 12/23/2016 10/27/2016   Decreased Interest 1 1   Down, Depressed, Hopeless 1 0   PHQ - 2 Score 2  1   Altered sleeping 0 1   Tired, decreased energy 0 3   Change in appetite 2 1   Feeling bad or failure about yourself  0 0   Trouble concentrating 1 1   Moving slowly or fidgety/restless 0 0   Suicidal thoughts 0 0   PHQ-9 Score 5 7   Difficult doing work/chores Not difficult at all Somewhat difficult     Interpretation of Total Score  Total Score Depression Severity:  1-4 = Minimal depression, 5-9 = Mild depression, 10-14 = Moderate depression, 15-19 = Moderately severe depression, 20-27 = Severe depression   Psychosocial Evaluation and Intervention:     Psychosocial Evaluation - 10/28/16 0946      Psychosocial Evaluation & Interventions   Interventions Encouraged to exercise with the program and follow exercise prescription;Stress management education   Comments Counselor met with Mr. Hartis today (Ron) for initial psychosocial evaluation.  He is a 71 year old who had a mild heart attack a few months ago and [facility].  He has a strong support system with a spouse of 56 years and a daughter who lives close by.  Ron reports he sleeps well but not a great appetite.  He admits to a history fo depression in the past (undiagnosed) but states he is typically in a positive mood currently.  Ron has stress currently with his spouse's health (she is disabled); and finances due to his spouse's spending habits.  He has goals to lose weight; eat healthier; and be healthier while in this program.  Counselor and staff will continue to follow with Ron throughout the course of this program.     Expected Outcomes Ron will benefit from the  psychoeducational components of this program for stress management and coping strategies.  He also will benefit from meeting with the dietician to learn more about healthier eating habits.  Ron needs to exercise consistently to achieve his stated goals.     Continue Psychosocial Services  Follow up required by staff      Psychosocial Re-Evaluation:     Psychosocial Re-Evaluation    Carlyss Name 11/27/16 [phone]             Psychosocial Re-Evaluation   Current issues with Current Sleep Concerns;Current Stress Concerns       Comments Ron has been doing well in rehab.  He has enjoyed the exercise.  He continues to worry about his wife, her health, and her spending habits.  He tried to give her an allowance, but she is still over spending.  He has also started to worry more about himself and whether or not he will be around long enough to make sure that his wife is taken care of and be able to care for her as long as he can.  He has noticed that he has become more forgetful recently.  He loses his keys and phone but his biggest concern is he is forgetting to zip his pants before leaving the house.  He has also had a few bout of forgetfulness at the store and remember where he was going.       Expected Outcomes Short: Take care of himself so that he can stay healthy.  Long: Continue to focus healthy lifestyle       Interventions Stress management education;Encouraged to attend Cardiac Rehabilitation for the exercise         Initial Review   Source of Stress Concerns  Family;Financial;Chronic Illness          Psychosocial Discharge (Final Psychosocial Re-Evaluation):     Psychosocial Re-Evaluation - 11/27/16 0939      Psychosocial Re-Evaluation   Current issues with Current Sleep Concerns;Current Stress Concerns   Comments Ron has been doing well in rehab.  He has enjoyed the exercise.  He continues to worry about his wife, her health, and her spending habits.  He tried to give her an allowance,  but she is still over spending.  He has also started to worry more about himself and whether or not he will be around long enough to make sure that his wife is taken care of and be able to care for her as long as he can.  He has noticed that he has become more forgetful recently.  He loses his keys and phone but his biggest concern is he is forgetting to zip his pants before leaving the house.  He has also had a few bout of forgetfulness at the store and remember where he was going.   Expected Outcomes Short: Take care of himself so that he can stay healthy.  Long: Continue to focus healthy lifestyle   Interventions Stress management education;Encouraged to attend Cardiac Rehabilitation for the exercise     Initial Review   Source of Stress Concerns Family;Financial;Chronic Illness      Vocational Rehabilitation: Provide vocational rehab assistance to qualifying candidates.   Vocational Rehab Evaluation & Intervention:     Vocational Rehab - 10/27/16 1052      Initial Vocational Rehab Evaluation & Intervention   Assessment shows need for Vocational Rehabilitation No      Education: Education Goals: Education classes will be provided on a variety of topics geared toward better understanding of heart health and risk factor modification. Participant will state understanding/return demonstration of topics presented as noted by education test scores.  Learning Barriers/Preferences:     Learning Barriers/Preferences - 10/27/16 1343      Learning Barriers/Preferences   Learning Barriers None   Learning Preferences Individual Instruction;Pictoral;Skilled Demonstration;Video      Education Topics: General Nutrition Guidelines/Fats and Fiber: -Group instruction provided by verbal, written material, models and posters to present the general guidelines for heart healthy nutrition. Gives an explanation and review of dietary fats and fiber.   Cardiac Rehab from 12/30/2016 in [facility] Cardiac and  Pulmonary Rehab  Date  12/23/16  Educator  CR  Instruction Review Code  1- Verbalizes Understanding      Controlling Sodium/Reading Food Labels: -Group verbal and written material supporting the discussion of sodium use in heart healthy nutrition. Review and explanation with models, verbal and written materials for utilization of the food label.   Cardiac Rehab from 12/30/2016 in [facility] Cardiac and Pulmonary Rehab  Date  12/30/16  Educator  PI  Instruction Review Code  1- Verbalizes Understanding      Exercise Physiology & Risk Factors: - Group verbal and written instruction with models to review the exercise physiology of the cardiovascular system and associated critical values. Details cardiovascular disease risk factors and the goals associated with each risk factor.   Cardiac Rehab from 12/30/2016 in [facility] Cardiac and Pulmonary Rehab  Date  11/11/16  Educator  RG/AS      Aerobic Exercise & Resistance Training: - Gives group verbal and written discussion on the health impact of inactivity. On the components of aerobic and resistive training programs and the benefits of this training and how to safely progress through  these programs.   Cardiac Rehab from 12/30/2016 in [facility] Cardiac and Pulmonary Rehab  Date  11/13/16  Educator  AS      Flexibility, Balance, General Exercise Guidelines: - Provides group verbal and written instruction on the benefits of flexibility and balance training programs. Provides general exercise guidelines with specific guidelines to those with heart or lung disease. Demonstration and skill practice provided.   Cardiac Rehab from 12/30/2016 in [facility] Cardiac and Pulmonary Rehab  Date  11/18/16  Educator  AS      Stress Management: - Provides group verbal and written instruction about the health risks of elevated stress, cause of high stress, and healthy ways to reduce stress.   Cardiac Rehab from 12/30/2016 in [facility] Cardiac and Pulmonary Rehab  Date  11/25/16   Educator  [place]      Depression: - Provides group verbal and written instruction on the correlation between heart/lung disease and depressed mood, treatment options, and the stigmas associated with seeking treatment.   Cardiac Rehab from 12/30/2016 in [facility] Cardiac and Pulmonary Rehab  Date  12/18/16  Educator  [facility]  Instruction Review Code (retired)  2- meets Designer, fashion/clothing & Physiology of the Heart: - Group verbal and written instruction and models provide basic cardiac anatomy and physiology, with the coronary electrical and arterial systems. Review of: AMI, Angina, Valve disease, Heart Failure, Cardiac Arrhythmia, Pacemakers, and the ICD.   Cardiac Rehab from 12/30/2016 in [facility] Cardiac and Pulmonary Rehab  Date  11/27/16  Educator  [facility]      Cardiac Procedures: - Group verbal and written instruction to review commonly prescribed medications for heart disease. Reviews the medication, class of the drug, and side effects. Includes the steps to properly store meds and maintain the prescription regimen. (beta blockers and nitrates)   Cardiac Rehab from 12/30/2016 in [facility] Cardiac and Pulmonary Rehab  Date  12/02/16  Educator  CE      Cardiac Medications I: - Group verbal and written instruction to review commonly prescribed medications for heart disease. Reviews the medication, class of the drug, and side effects. Includes the steps to properly store meds and maintain the prescription regimen.   Cardiac Rehab from 12/30/2016 in [facility] Cardiac and Pulmonary Rehab  Date  12/09/16 [8/7 Part One 8/9 Part Two]  Educator  SB      Cardiac Medications II: -Group verbal and written instruction to review commonly prescribed medications for heart disease. Reviews the medication, class of the drug, and side effects. (all other drug classes)    Go Sex-Intimacy & Heart Disease, Get SMART - Goal Setting: - Group verbal and written instruction through game format to discuss heart disease  and the return to sexual intimacy. Provides group verbal and written material to discuss and apply goal setting through the application of the S.M.A.R.T. Method.   Cardiac Rehab from 12/30/2016 in [facility] Cardiac and Pulmonary Rehab  Date  12/02/16  Educator  CE      Other Matters of the Heart: - Provides group verbal, written materials and models to describe Heart Failure, Angina, Valve Disease, Peripheral Artery Disease, and Diabetes in the realm of heart disease. Includes description of the disease process and treatment options available to the cardiac patient.   Cardiac Rehab from 12/30/2016 in [facility] Cardiac and Pulmonary Rehab  Date  11/27/16  Educator  [facility]      Exercise & Equipment Safety: - Individual verbal instruction and demonstration of equipment use and safety with  use of the equipment.   Cardiac Rehab from 12/30/2016 in [facility] Cardiac and Pulmonary Rehab  Date  10/27/16  Educator  SB      Infection Prevention: - Provides verbal and written material to individual with discussion of infection control including proper hand washing and proper equipment cleaning during exercise session.   Cardiac Rehab from 12/30/2016 in [facility] Cardiac and Pulmonary Rehab  Date  10/27/16  Educator  SB      Falls Prevention: - Provides verbal and written material to individual with discussion of falls prevention and safety.   Cardiac Rehab from 12/30/2016 in [facility] Cardiac and Pulmonary Rehab  Date  10/27/16  Educator  SB  Instruction Review Code (retired)  2- meets goals/outcomes      Diabetes: - Individual verbal and written instruction to review signs/symptoms of diabetes, desired ranges of glucose level fasting, after meals and with exercise. Acknowledge that pre and post exercise glucose checks will be done for 3 sessions at entry of program.   Other: -Provides group and verbal instruction on various topics (see comments)    Knowledge Questionnaire Score:     Knowledge Questionnaire  Score - 12/23/16 0818      Knowledge Questionnaire Score   Pre Score 24/28   Post Score 23/28      Core Components/Risk Factors/Patient Goals at Admission:     Personal Goals and Risk Factors at Admission - 10/27/16 1332      Core Components/Risk Factors/Patient Goals on Admission    Weight Management Yes;Weight Loss;Obesity   Intervention Weight Management: Develop a combined nutrition and exercise program designed to reach desired caloric intake, while maintaining appropriate intake of nutrient and fiber, sodium and fats, and appropriate energy expenditure required for the weight goal.;Weight Management: Provide education and appropriate resources to help participant work on and attain dietary goals.;Weight Management/Obesity: Establish reasonable short term and long term weight goals.;Obesity: Provide education and appropriate resources to help participant work on and attain dietary goals.   Admit Weight 195 lb 3.2 oz (88.5 kg)   Goal Weight: Short Term 193 lb (87.5 kg)   Goal Weight: Long Term 174 lb (78.9 kg)   Expected Outcomes Short Term: Continue to assess and modify interventions until short term weight is achieved;Long Term: Adherence to nutrition and physical activity/exercise program aimed toward attainment of established weight goal;Weight Loss: Understanding of general recommendations for a balanced deficit meal plan, which promotes 1-2 lb weight loss per week and includes a negative energy balance of [phone] kcal/d;Understanding recommendations for meals to include 15-35% energy as protein, 25-35% energy from fat, 35-60% energy from carbohydrates, less than 239m of dietary cholesterol, 20-35 gm of total fiber daily;Understanding of distribution of calorie intake throughout the day with the consumption of 4-5 meals/snacks   Diabetes --  Erroneous charting: Does not have diabetes   Intervention Provide education about signs/symptoms and action to take for  hypo/hyperglycemia.;Provide education about proper nutrition, including hydration, and aerobic/resistive exercise prescription along with prescribed medications to achieve blood glucose in normal ranges: Fasting glucose 65-99 mg/dL   Expected Outcomes Short Term: Participant verbalizes understanding of the signs/symptoms and immediate care of hyper/hypoglycemia, proper foot care and importance of medication, aerobic/resistive exercise and nutrition plan for blood glucose control.;Long Term: Attainment of HbA1C < 7%.   Heart Failure Yes   Intervention Provide a combined exercise and nutrition program that is supplemented with education, support and counseling about heart failure. Directed toward relieving symptoms such as shortness of breath, decreased exercise  tolerance, and extremity edema.   Expected Outcomes Improve functional capacity of life;Short term: Attendance in program 2-3 days a week with increased exercise capacity. Reported lower sodium intake. Reported increased fruit and vegetable intake. Reports medication compliance.;Short term: Daily weights obtained and reported for increase. Utilizing diuretic protocols set by physician.;Long term: Adoption of self-care skills and reduction of barriers for early signs and symptoms recognition and intervention leading to self-care maintenance.   Hypertension Yes   Intervention Provide education on lifestyle modifcations including regular physical activity/exercise, weight management, moderate sodium restriction and increased consumption of fresh fruit, vegetables, and low fat dairy, alcohol moderation, and smoking cessation.;Monitor prescription use compliance.   Expected Outcomes Short Term: Continued assessment and intervention until BP is < 140/25m HG in hypertensive participants. < 130/864mHG in hypertensive participants with diabetes, heart failure or chronic kidney disease.;Long Term: Maintenance of blood pressure at goal levels.   Lipids Yes    Intervention Provide education and support for participant on nutrition & aerobic/resistive exercise along with prescribed medications to achieve LDL <7041mHDL >67m67m Expected Outcomes Short Term: Participant states understanding of desired cholesterol values and is compliant with medications prescribed. Participant is following exercise prescription and nutrition guidelines.;Long Term: Cholesterol controlled with medications as prescribed, with individualized exercise RX and with personalized nutrition plan. Value goals: LDL < 70mg12mL > 40 mg.   Stress Yes  Fianacial and family. "Wife spends monet when she shouldn't"  and she has chronic illness, neither can cook meals at home, so have to pick up meals. Unable to do what he wants without needing to stop and rest. Wants to be able to do more .    Intervention Offer individual and/or small group education and counseling on adjustment to heart disease, stress management and health-related lifestyle change. Teach and support self-help strategies.;Refer participants experiencing significant psychosocial distress to appropriate mental health specialists for further evaluation and treatment. When possible, include family members and significant others in education/counseling sessions.   Expected Outcomes Short Term: Participant demonstrates changes in health-related behavior, relaxation and other stress management skills, ability to obtain effective social support, and compliance with psychotropic medications if prescribed.;Long Term: Emotional wellbeing is indicated by absence of clinically significant psychosocial distress or social isolation.      Core Components/Risk Factors/Patient Goals Review:      Goals and Risk Factor Review    Row Name 11/27/16 0920             Core Components/Risk Factors/Patient Goals Review   Personal Goals Review Weight Management/Obesity;Hypertension;Lipids       Review Ron has been doing well in rehab.  His weight  was up 3 lbs today.  He was aysmptomatic.  He is trying to be good with his diet.  Ron has been dong [employer] with his blood pressure and medications.       Expected Outcomes Short: Work on weight loss by increasing acitivity and watching diet.  Long: Continue to work on risk factor modifications.          Core Components/Risk Factors/Patient Goals at Discharge (Final Review):      Goals and Risk Factor Review - 11/27/16 0920      Core Components/Risk Factors/Patient Goals Review   Personal Goals Review Weight Management/Obesity;Hypertension;Lipids   Review Ron has been doing well in rehab.  His weight was up 3 lbs today.  He was aysmptomatic.  He is trying to be good with his diet.  Ron has been dong [employer] convey[employer]  with his blood pressure and medications.   Expected Outcomes Short: Work on weight loss by increasing acitivity and watching diet.  Long: Continue to work on risk factor modifications.      ITP Comments:     ITP Comments    Row Name 10/27/16 1323 11/19/16 0639 12/17/16 0612       ITP Comments Medical review completed. Initial ITP created.  DIagnosis documentation can be found in [facility] [employer]. [facility] note for Cardiac Rehab 30 day review. Continue with ITP unless directed changes per Medical Director review    30 day review. Continue with ITP unless directed changes per Medical Director review         Comments: Discharge ITP

## 2017-11-15 ENCOUNTER — Emergency Department
Admission: EM | Admit: 2017-11-15 | Discharge: 2017-11-15 | Disposition: A | Discharge status: Other Acute Inpatient Hospital | Payer: Non-veteran care | Attending: Emergency Medicine | Admitting: Emergency Medicine

## 2017-11-15 ENCOUNTER — Emergency Department: Payer: Non-veteran care

## 2017-11-15 ENCOUNTER — Ambulatory Visit (HOSPITAL_COMMUNITY)
Admission: AD | Admit: 2017-11-15 | Discharge: 2017-11-15 | Disposition: A | Discharge status: Home / Self Care | Payer: Non-veteran care | Source: Other Acute Inpatient Hospital | Attending: Emergency Medicine | Admitting: Emergency Medicine

## 2017-11-15 ENCOUNTER — Other Ambulatory Visit: Payer: Self-pay

## 2017-11-15 DIAGNOSIS — I110 Hypertensive heart disease with heart failure: Secondary | ICD-10-CM | POA: Diagnosis not present

## 2017-11-15 DIAGNOSIS — Z951 Presence of aortocoronary bypass graft: Secondary | ICD-10-CM | POA: Diagnosis not present

## 2017-11-15 DIAGNOSIS — R079 Chest pain, unspecified: Secondary | ICD-10-CM | POA: Insufficient documentation

## 2017-11-15 DIAGNOSIS — I2510 Atherosclerotic heart disease of native coronary artery without angina pectoris: Secondary | ICD-10-CM | POA: Insufficient documentation

## 2017-11-15 DIAGNOSIS — Z7902 Long term (current) use of antithrombotics/antiplatelets: Secondary | ICD-10-CM | POA: Diagnosis not present

## 2017-11-15 DIAGNOSIS — Z79899 Other long term (current) drug therapy: Secondary | ICD-10-CM | POA: Insufficient documentation

## 2017-11-15 DIAGNOSIS — I5022 Chronic systolic (congestive) heart failure: Secondary | ICD-10-CM | POA: Diagnosis not present

## 2017-11-15 DIAGNOSIS — Z87891 Personal history of nicotine dependence: Secondary | ICD-10-CM | POA: Insufficient documentation

## 2017-11-15 DIAGNOSIS — Z7982 Long term (current) use of aspirin: Secondary | ICD-10-CM | POA: Insufficient documentation

## 2017-11-15 LAB — BASIC METABOLIC PANEL
ANION GAP: 8 (ref 5–15)
BUN: 33 mg/dL — ABNORMAL HIGH (ref 8–23)
CHLORIDE: 105 mmol/L (ref 98–111)
CO2: 24 mmol/L (ref 22–32)
Calcium: 9 mg/dL (ref 8.9–10.3)
Creatinine, Ser: 1.66 mg/dL — ABNORMAL HIGH (ref 0.61–1.24)
GFR calc non Af Amer: 40 mL/min — ABNORMAL LOW (ref >60)
GFR, EST AFRICAN AMERICAN: 46 mL/min — AB (ref >60)
GLUCOSE: 95 mg/dL (ref 70–99)
Potassium: 4.8 mmol/L (ref 3.5–5.1)
Sodium: 137 mmol/L (ref 135–145)

## 2017-11-15 LAB — CBC
HCT: 38.4 % — ABNORMAL LOW (ref 40.0–52.0)
HEMOGLOBIN: 13.2 g/dL (ref 13.0–18.0)
MCH: 32.1 pg (ref 26.0–34.0)
MCHC: 34.3 g/dL (ref 32.0–36.0)
MCV: 93.4 fL (ref 80.0–100.0)
Platelets: 181 10*3/uL (ref 150–440)
RBC: 4.11 MIL/uL — AB (ref 4.40–5.90)
RDW: 17 % — ABNORMAL HIGH (ref 11.5–14.5)
WBC: 5.8 10*3/uL (ref 3.8–10.6)

## 2017-11-15 LAB — TROPONIN I: Troponin I: <0.03 ng/mL (ref <0.03)

## 2017-11-15 MED ORDER — SODIUM CHLORIDE 0.9 % IV BOLUS
500.0000 mL | Freq: Once | INTRAVENOUS | Status: AC
  Administered 2017-11-15: 500 mL via INTRAVENOUS

## 2017-11-15 MED ORDER — ASPIRIN EC 81 MG PO TBEC
162.0000 mg | DELAYED_RELEASE_TABLET | Freq: Once | ORAL | Status: AC
  Administered 2017-11-15: 162 mg via ORAL
  Filled 2017-11-15: qty 2

## 2017-11-15 NOTE — ED Provider Notes (Signed)
[facility] Emergency Department Provider Note  ____________________________________________   None    (approximate)  I have reviewed the triage vital signs and the nursing notes.   HISTORY  Chief Complaint Chest Pain   HPI Eliezer ChampagneRonald E Papa is a 72 y.o. male history of coronary disease, congestive heart failure, V. tach, A. fib  Patient reports about 2 to 3 days now he is been experiencing off-and-on discomfort in the center of his chest that feels like a slight pressure feeling.  Reports he is concerned it could be his heart.  It comes and goes, seemed worse yesterday after working in the yard.  Was moderate earlier but is now just a very mild discomfort, will reports not very painful or even bothersome at this time.  Somewhat abrupt in onset, 2 to 3 days.  Feels like a pressure.  No leg swelling.  No history of any blood clots.  Reports she takes a "blood thinner" but he cannot recall name at this time.  Does take aspirin for which he took 81 mg earlier today  Past Medical History:  Diagnosis Date  . Atrial fibrillation (HCC)   . CAD (coronary artery disease)    a. s/p CABG 1997;  b. s/p PCI to [facility]-R[facility] 2006;  c. s/p BMS x 3 to S-RCA 10/10;  d.  Myoview 2010: EF 24%, basal inf, mid inf. apical inf, basal inf-lat and mid IL scar, no isch;  e. cath 1/11: S-RCA occluded, S-OM chronically occluded, L-LAD ok with L-R collaterals (med. Tx)  . HLD (hyperlipidemia)   . HTN (hypertension)   . Ischemic cardiomyopathy    a. EF 25-35%;   b. echo 3/11: EF 35%, mild LVH, severe inf/post HK, lat and apical HK, grade 1 diast dysfxn, mild AI, mild to mod MR, PASP 32  . Systolic CHF, chronic (HCC)   . Ventricular tachycardia (HCC)    a. s/p AICD 2006;   b. Amio intolerant due to liver and thyroid issues;   c. 2/12:  quinidine added to mexilitine  (consider Tikosyn in future??)    Patient Active Problem List   Diagnosis Date Noted  . Acute renal failure (ARF) (HCC)  06/15/2015  . Hyponatremia 06/15/2015  . Visual hallucinations 06/15/2015  . Chronic systolic heart failure (HCC) 08/30/2010  . HYPOTENSION 07/23/2009  . TRANSAMINASES, SERUM, ELEVATED 07/23/2009  . HYPERCHOLESTEROLEMIA  IIA 06/21/2009  . CAD, ARTERY BYPASS GRAFT 06/21/2009  . CARDIOMYOPATHY, ISCHEMIC 06/21/2009  . AV BLOCK, 1ST DEGREE 06/21/2009  . 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 VENTRICULAR TACHYCARDIA 06/21/2009  . DYSPNEA ON EXERTION 06/21/2009  . ICD - IN SITU 06/21/2009    Past Surgical History:  Procedure Laterality Date  . BLADDER TUMOR EXCISION    . CARDIAC CATHETERIZATION  2006,02/2009  . CORONARY ARTERY BYPASS GRAFT  1997  . Medtronic implantable cardioverter-dibrillator   2006   with sprint fidelis lead    Prior to Admission medications   Medication Sig Start Date End Date Taking? Authorizing Provider  acetaminophen (TYLENOL) 325 MG tablet Take 975 mg by mouth 3 (three) times daily.     [provider]  aspirin EC 81 MG tablet Take 81 mg by mouth at bedtime.    [provider]  carvedilol (COREG) 12.5 MG tablet Take 12.5 mg by mouth 2 (two) times daily with a meal.      [provider]  cefUROXime (CEFTIN) 500 MG tablet Take 1 tablet (500 mg total)  by mouth 2 (two) times daily with a meal. 06/18/15   Hower, Cletis [place], MD  citalopram (CELEXA) 20 MG tablet Take 20 mg by mouth at bedtime.    [provider]  clopidogrel (PLAVIX) 75 MG tablet Take 75 mg by mouth at bedtime.    [provider]  finasteride (PROSCAR) 5 MG tablet Take 5 mg by mouth daily.    [provider]  fluconazole (DIFLUCAN) 200 MG tablet Take 200 mg by mouth daily.    [provider]  furosemide (LASIX) 20 MG tablet Take 20 mg by mouth at bedtime.    [provider]  gabapentin (NEURONTIN) 400 MG capsule Take 1,200 mg by mouth 3 (three) times daily.    [provider]  hydrocerin (EUCERIN) CREA  Apply 1 application topically 2 (two) times daily as needed (for irritation).    [provider]  hydrocortisone 2.5 % ointment Apply 1 application topically 2 (two) times daily as needed (for itching).    [provider]  isosorbide mononitrate (IMDUR) 30 MG 24 hr tablet Take 30 mg by mouth daily as needed (for increased BP).    [provider]  lisinopril (PRINIVIL,ZESTRIL) 10 MG tablet Take 10 mg by mouth daily.    [provider]  Menthol-Methyl Salicylate (THERA-GESIC EX) Apply 1 application topically 3 (three) times daily as needed (for pain).    [provider]  methocarbamol (ROBAXIN) 750 MG tablet Take 750 mg by mouth 2 (two) times daily as needed for muscle spasms.    [provider]  metoprolol succinate (TOPROL-XL) 25 MG 24 hr tablet Take 25 mg by mouth daily.    [provider]  mexiletine (MEXITIL) 250 MG capsule Take 250 mg by mouth 2 (two) times daily.      [provider]  niacin 500 MG tablet Take 500 mg by mouth at bedtime.    [provider]  nitroGLYCERIN (NITROSTAT) 0.4 MG SL tablet Place 0.4 mg under the tongue every 5 (five) minutes as needed for chest pain.     [provider]  Omega-3 Fatty Acids (FISH OIL) 1000 MG CAPS Take 1,000 mg by mouth at bedtime.    [provider]  omeprazole (PRILOSEC) 20 MG capsule Take 40 mg by mouth 2 (two) times daily before a meal.    [provider]  quiniDINE gluconate 324 MG CR tablet Take 324 mg by mouth 2 (two) times daily.    [provider]  simvastatin (ZOCOR) 40 MG tablet Take 40 mg by mouth at bedtime.    [provider]  Tamsulosin HCl (FLOMAX) 0.4 MG CAPS Take 0.4 mg by mouth 2 (two) times daily.     [provider]    Allergies Amiodarone  Family History  Problem Relation Age of Onset  . Heart failure Mother        congestive  . Heart attack Brother 51       hx of CABG    Social  History Social History   Tobacco Use  . Smoking status: Former Smoker    Packs/day: 1.00    Years: 50.00    Pack years: 50.00    Types: Cigarettes    Last attempt to quit: 05/05/2002    Years since quitting: 15.5  . Smokeless tobacco: Never Used  Substance Use Topics  . Alcohol use: Yes    Comment: 3 beers weekly  . Drug use: No    Review of Systems Constitutional:  No fever/chills Eyes: No visual changes. ENT: No sore throat. Cardiovascular: See HPI respiratory: Denies shortness of breath. Gastrointestinal: No abdominal pain.  No nausea, no vomiting.  No diarrhea.  No constipation. Genitourinary: Negative for dysuria. Musculoskeletal: Negative for back pain. Skin: Negative for rash. Neurological: Negative for headaches, focal weakness or numbness.    ____________________________________________   PHYSICAL EXAM:  VITAL SIGNS: ED Triage Vitals  Enc Vitals Group     BP 11/15/17 1352 (!) 197/168     Pulse Rate 11/15/17 1352 71     Resp 11/15/17 1352 18     Temp 11/15/17 1352 97.7 F (36.5 C)     Temp Source 11/15/17 1352 Oral     SpO2 11/15/17 1352 99 %     Weight 11/15/17 1350 200 lb (90.7 kg)     Height 11/15/17 1350 5\' 8"  (1.727 m)     Head Circumference --      Peak Flow --      Pain Score 11/15/17 1350 8     Pain Loc --      Pain Edu? --      Excl. in GC? --     Constitutional: Alert and oriented. Well appearing and in no acute distress. Eyes: Conjunctivae are normal. Head: Atraumatic. Nose: No congestion/rhinnorhea. Mouth/Throat: Mucous membranes are moist. Neck: No stridor.   Cardiovascular: Normal rate, regular rhythm. Grossly normal heart sounds.  Good peripheral circulation.  ICD pocket left upper chest.  Old sternotomy scar. Respiratory: Normal respiratory effort.  No retractions. Lungs CTAB. Gastrointestinal: Soft and nontender. No distention. Musculoskeletal: No lower extremity tenderness nor edema.  No thigh tenderness.  No venous cords or  congestion. Neurologic:  Normal speech and language. No gross focal neurologic deficits are appreciated.  Skin:  Skin is warm, dry and intact. No rash noted. Psychiatric: Mood and affect are normal. Speech and behavior are normal.  ____________________________________________   LABS (all labs ordered are listed, but only abnormal results are displayed)  Labs Reviewed  BASIC METABOLIC PANEL - Abnormal; Notable for the following components:      Result Value   BUN 33 (*)    Creatinine, Ser 1.66 (*)    GFR calc non Af Amer 40 (*)    GFR calc Af Amer 46 (*)    All other components within normal limits  CBC - Abnormal; Notable for the following components:   RBC 4.11 (*)    HCT 38.4 (*)    RDW 17.0 (*)    All other components within normal limits  TROPONIN I  TROPONIN I   ____________________________________________  EKG  Reviewed and interpreted by me at 1510 Heart rate 79 QRS 169 QTc 460 AV dual paced, compared with previous EKG there is no apparent ST depression notable in V2, also other nonspecific T wave changes compared with previous are noted.  No ST elevation MI ____________________________________________  RADIOLOGY  Dg Chest 2 View  Result Date: 11/15/2017 CLINICAL DATA:  Patient reports central CP onset 3 days ago. Denies SOB, cough or fever at this time. Reports CP has gotten steadily worse over past 3 days. Hx V-tach, CHF, HTN, afib, CABG, defibrillator placement. Former smoker. EXAM: CHEST - 2 VIEW COMPARISON:  10/20/2011 and multiple prior studies including 06/15/2004 FINDINGS: Status post median sternotomy. Patient has LEFT-sided AICD with leads to the RIGHT atrium, RIGHT ventricle, and coronary sinus. The heart is mildly enlarged and stable in configuration. There are no focal consolidations or pleural effusions. No pulmonary edema. IMPRESSION:  No active cardiopulmonary disease. Electronically Signed   By: Norva Pavlov M.D.   On: 11/15/2017 15:26    Chest  x-ray reviewed by me negative ____________________________________________   PROCEDURES  Procedure(s) performed: None  Procedures  Critical Care performed: No  ____________________________________________   INITIAL IMPRESSION / ASSESSMENT AND PLAN / ED COURSE  Pertinent labs & imaging results that were available during my care of the patient were reviewed by me and considered in my medical decision making (see chart for details).  Patient presents for evaluation of chest discomfort.  Patient moderate risk for coronary disease.  EKG with slight change from previous.  Given the history of chest pressure symptomatology, following yardwork I suspect this may be coronary in etiology but his troponin is normal and with his symptoms about 2 to 3 days and a normal troponin, I do not believe this clearly represents an NSTEMI or unstable angina.  He is currently almost pain and symptom-free at rest, will give additional aspirin here continue to monitor.  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  Patient's blood pressures on my evaluation 104 and 107 systolic on individual arms.  No ripping tearing or moving pain.  No notable pleuritic pain.  No hypoxia or tachycardia, doubt pulmonary embolism.  Most concerning potential cardiac etiology.  States does report he is outside yesterday, feels slightly dry, and his blood pressures mildly low on recheck we will give a small fluid bolus.  Clinical Course as of Nov 16 24  Wynelle Link Nov 15, 2017  1610 Patient is resting comfortably at this time, awaiting transfer to the [place] who report they believe they will be able to get him a telemetry bed.  Due to the significant delay in hearing back from the [place] I did offer patient admission here, but he did wish to wait and is continue to wait  patiently for a bed open at the [place] which it now sounds will hopefully occur relatively soon.  We will send a second troponin at this time.  Patient reports pain-free resting comfortably at this time.  Hemodynamically stable   [MQ]  [facility], continuing to await a potential [facility].  Not yet assigned.  Patient resting comfortably eating a meal.  Updated on wait at [facility] and continues to be patient   [MQ]    Clinical Course User Index [MQ] Sharyn Creamer, MD   Patient evaluated in room at time of [facility] team arrival.  He is resting comfortably, understanding and agreeable with plan for transfer.  Normal hemodynamics.  Stable for transfer to the [facility].  Patient accepted the [facility] by [facility] via ED physician.  ____________________________________________   FINAL CLINICAL IMPRESSION(S) / ED DIAGNOSES  Final diagnoses:  Moderate risk chest pain      NEW MEDICATIONS STARTED DURING THIS VISIT:  Discharge Medication List as of 11/15/2017 10:46 PM       Note:  This document was prepared using Dragon voice recognition software and may include unintentional dictation errors.     Sharyn Creamer, MD 11/16/17 [phone]

## 2017-11-15 NOTE — ED Notes (Signed)
.   Pt is resting, Respirations even and unlabored, NAD. Stretcher lowest postion and locked. Call bell within reach. Denies any needs at this time RN will continue to monitor.    

## 2017-11-15 NOTE — ED Notes (Signed)
[facility]. Attempted to call once already.

## 2017-11-15 NOTE — ED Triage Notes (Addendum)
Pt arrives to ED c/o of CP that began 2-3 days ago. Pt holding central chest. Pt has defibrillator. Denies it going off. Central CP. Denies radiation. Denies N&V& diaphoresis. States pain with deep breaths. Alert, oriented. Extensive cardiac hx.

## 2017-11-15 NOTE — ED Notes (Signed)
Patient transported to X-ray 

## 2017-11-15 NOTE — ED Notes (Signed)
Pt provided a meal tray and denies wanting a drink at this time. Pt remains in NAD. Pt reporting concerns with home medications. RN assured pt they are up to date in computer and will be administered upon admission to the hospital. MD at bedside to update pt as well.

## 2018-07-27 ENCOUNTER — Emergency Department: Payer: Non-veteran care

## 2018-07-27 ENCOUNTER — Encounter: Payer: Self-pay | Admitting: Emergency Medicine

## 2018-07-27 ENCOUNTER — Emergency Department
Admission: EM | Admit: 2018-07-27 | Discharge: 2018-07-27 | Disposition: A | Discharge status: Home / Self Care | Payer: Non-veteran care | Attending: Emergency Medicine | Admitting: Emergency Medicine

## 2018-07-27 DIAGNOSIS — Z79899 Other long term (current) drug therapy: Secondary | ICD-10-CM | POA: Insufficient documentation

## 2018-07-27 DIAGNOSIS — I5022 Chronic systolic (congestive) heart failure: Secondary | ICD-10-CM | POA: Diagnosis not present

## 2018-07-27 DIAGNOSIS — I2510 Atherosclerotic heart disease of native coronary artery without angina pectoris: Secondary | ICD-10-CM | POA: Diagnosis not present

## 2018-07-27 DIAGNOSIS — Z87891 Personal history of nicotine dependence: Secondary | ICD-10-CM | POA: Diagnosis not present

## 2018-07-27 DIAGNOSIS — R079 Chest pain, unspecified: Secondary | ICD-10-CM | POA: Diagnosis present

## 2018-07-27 DIAGNOSIS — I4891 Unspecified atrial fibrillation: Secondary | ICD-10-CM | POA: Insufficient documentation

## 2018-07-27 DIAGNOSIS — I110 Hypertensive heart disease with heart failure: Secondary | ICD-10-CM | POA: Diagnosis not present

## 2018-07-27 DIAGNOSIS — R0789 Other chest pain: Secondary | ICD-10-CM

## 2018-07-27 LAB — CBC
HEMATOCRIT: 42.5 % (ref 39.0–52.0)
Hemoglobin: 14.2 g/dL (ref 13.0–17.0)
MCH: 30.5 pg (ref 26.0–34.0)
MCHC: 33.4 g/dL (ref 30.0–36.0)
MCV: 91.2 fL (ref 80.0–100.0)
Platelets: 195 10*3/uL (ref 150–400)
RBC: 4.66 MIL/uL (ref 4.22–5.81)
RDW: 15.9 % — AB (ref 11.5–15.5)
WBC: 7 10*3/uL (ref 4.0–10.5)
nRBC: 0 % (ref 0.0–0.2)

## 2018-07-27 LAB — BASIC METABOLIC PANEL
Anion gap: 8 (ref 5–15)
BUN: 22 mg/dL (ref 8–23)
CHLORIDE: 106 mmol/L (ref 98–111)
CO2: 24 mmol/L (ref 22–32)
CREATININE: 1.58 mg/dL — AB (ref 0.61–1.24)
Calcium: 8.5 mg/dL — ABNORMAL LOW (ref 8.9–10.3)
GFR calc Af Amer: 50 mL/min — ABNORMAL LOW (ref >60)
GFR calc non Af Amer: 43 mL/min — ABNORMAL LOW (ref >60)
Glucose, Bld: 115 mg/dL — ABNORMAL HIGH (ref 70–99)
Potassium: 3.8 mmol/L (ref 3.5–5.1)
Sodium: 138 mmol/L (ref 135–145)

## 2018-07-27 LAB — PROTIME-INR
INR: 1 (ref 0.8–1.2)
Prothrombin Time: 12.8 seconds (ref 11.4–15.2)

## 2018-07-27 LAB — HEPATIC FUNCTION PANEL
ALK PHOS: 90 U/L (ref 38–126)
ALT: 23 U/L (ref 0–44)
AST: 26 U/L (ref 15–41)
Albumin: 4.1 g/dL (ref 3.5–5.0)
BILIRUBIN DIRECT: 0.2 mg/dL (ref 0.0–0.2)
BILIRUBIN TOTAL: 0.5 mg/dL (ref 0.3–1.2)
Indirect Bilirubin: 0.3 mg/dL (ref 0.3–0.9)
Total Protein: 6.8 g/dL (ref 6.5–8.1)

## 2018-07-27 LAB — TROPONIN I
Troponin I: <0.03 ng/mL (ref <0.03)
Troponin I: <0.03 ng/mL (ref <0.03)

## 2018-07-27 LAB — LIPASE, BLOOD: Lipase: 31 U/L (ref 11–51)

## 2018-07-27 MED ORDER — SODIUM CHLORIDE 0.9% FLUSH
3.0000 mL | Freq: Once | INTRAVENOUS | Status: AC
  Administered 2018-07-27: 3 mL via INTRAVENOUS

## 2018-07-27 NOTE — ED Provider Notes (Signed)
[facility] Emergency Department Provider Note  ____________________________________________   First MD Initiated Contact with Patient 07/27/18 0448     (approximate)  I have reviewed the triage vital signs and the nursing notes.   HISTORY  Chief Complaint Chest Pain    HPI Andrew [name] is a 73 y.o. male with extensive coronary artery history as listed below who presents for evaluation of acute onset central sharp chest pain that woke him from sleep.  He reports that it happened once but went away quickly and he went back to sleep.  It woke him up again and this time he took a nitroglycerin and then called EMS.  The nitroglycerin immediately resolved to the pain and he has not had any pain since then.  He denies shortness of breath, nausea, vomiting, diaphoresis, abdominal pain.  He has had similar symptoms in the past but he reports not having to take the nitroglycerin for years.  He has full benefits at the [place] and he was recently at his cardiologist office and they told him that his heart and his pacemaker/defibrillator are "out of sync".  They started him on Pradaxa and said that they would bring him back in after he has been on the Pradaxa for a while to give him a shock to get his heart back in sync.   He has been compliant with his medications which also includes Plavix.  Nothing in particular made the symptoms worse and the nitroglycerin made it resolved.        Past Medical History:  Diagnosis Date  . Atrial fibrillation (HCC)   . CAD (coronary artery disease)    a. s/p CABG 1997;  b. s/p PCI to [facility] 2006;  c. s/p BMS x 3 to S-RCA 10/10;  d.  Myoview 2010: EF 24%, basal inf, mid inf. apical inf, basal inf-lat and mid IL scar, no isch;  e. cath 1/11: S-RCA occluded, S-OM chronically occluded, L-LAD ok with L-R collaterals (med. Tx)  . HLD (hyperlipidemia)   . HTN (hypertension)   . Ischemic cardiomyopathy    a. EF 25-35%;   b. echo 3/11: EF 35%,  mild LVH, severe inf/post HK, lat and apical HK, grade 1 diast dysfxn, mild AI, mild to mod MR, PASP 32  . Systolic CHF, chronic (HCC)   . Ventricular tachycardia (HCC)    a. s/p AICD 2006;   b. Amio intolerant due to liver and thyroid issues;   c. 2/12:  quinidine added to mexilitine  (consider Tikosyn in future??)    Patient Active Problem List   Diagnosis Date Noted  . Acute renal failure (ARF) (HCC) 06/15/2015  . Hyponatremia 06/15/2015  . Visual hallucinations 06/15/2015  . Chronic systolic heart failure (HCC) 08/30/2010  . HYPOTENSION 07/23/2009  . TRANSAMINASES, SERUM, ELEVATED 07/23/2009  . HYPERCHOLESTEROLEMIA  IIA 06/21/2009  . CAD, ARTERY BYPASS GRAFT 06/21/2009  . CARDIOMYOPATHY, ISCHEMIC 06/21/2009  . AV BLOCK, 1ST DEGREE 06/21/2009  . 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 VENTRICULAR TACHYCARDIA 06/21/2009  . DYSPNEA ON EXERTION 06/21/2009  . ICD - IN SITU 06/21/2009    Past Surgical History:  Procedure Laterality Date  . BLADDER TUMOR EXCISION    . CARDIAC CATHETERIZATION  2006,02/2009  . CORONARY ARTERY BYPASS GRAFT  1997  . Medtronic implantable cardioverter-dibrillator   2006   with sprint fidelis lead    Prior to Admission medications   Medication Sig Start Date End Date Taking? Authorizing Provider  dabigatran (PRADAXA) 150 MG CAPS capsule Take 150 mg by mouth 2 (two) times daily.   Yes [provider]  acetaminophen (TYLENOL) 325 MG tablet Take 975 mg by mouth 3 (three) times daily.     [provider]  aspirin EC 81 MG tablet Take 81 mg by mouth at bedtime.    [provider]  carvedilol (COREG) 12.5 MG tablet Take 12.5 mg by mouth 2 (two) times daily with a meal.      [provider]  cefUROXime (CEFTIN) 500 MG tablet Take 1 tablet (500 mg total) by mouth 2 (two) times daily with a meal. 06/18/15   Hower, Cletis [place], MD  citalopram (CELEXA) 20 MG tablet Take 20 mg by mouth at bedtime.    [provider]  clopidogrel (PLAVIX) 75 MG tablet Take 75 mg by mouth at bedtime.    [provider]  finasteride (PROSCAR) 5 MG tablet Take 5 mg by mouth daily.    [provider]  fluconazole (DIFLUCAN) 200 MG tablet Take 200 mg by mouth daily.    [provider]  furosemide (LASIX) 20 MG tablet Take 20 mg by mouth at bedtime.    [provider]  gabapentin (NEURONTIN) 400 MG capsule Take 1,200 mg by mouth 3 (three) times daily.    [provider]  hydrocerin (EUCERIN) CREA Apply 1 application topically 2 (two) times daily as needed (for irritation).    [provider]  hydrocortisone 2.5 % ointment Apply 1 application topically 2 (two) times daily as needed (for itching).    [provider]  isosorbide mononitrate (IMDUR) 30 MG 24 hr tablet Take 30 mg by mouth daily as needed (for increased BP).    [provider]  lisinopril (PRINIVIL,ZESTRIL) 10 MG tablet Take 10 mg by mouth daily.    [provider]  Menthol-Methyl Salicylate (THERA-GESIC EX) Apply 1 application topically 3 (three) times daily as needed (for pain).    [provider]  methocarbamol (ROBAXIN) 750 MG tablet Take 750 mg by mouth 2 (two) times daily as needed for muscle spasms.    [provider]  metoprolol succinate (TOPROL-XL) 25 MG 24 hr tablet Take 25 mg by mouth daily.    [provider]  mexiletine (MEXITIL) 250 MG capsule Take 250 mg by mouth 2 (two) times daily.      [provider]  niacin 500 MG tablet Take 500 mg by mouth at bedtime.    [provider]  nitroGLYCERIN (NITROSTAT) 0.4 MG SL tablet Place 0.4 mg under the tongue every 5 (five) minutes as needed for chest pain.     [provider]  Omega-3 Fatty Acids (FISH OIL) 1000 MG CAPS Take 1,000 mg by mouth at bedtime.    [provider]  omeprazole (PRILOSEC) 20 MG capsule Take 40 mg by mouth 2 (two) times daily before a  meal.    [provider]  quiniDINE gluconate 324 MG CR tablet Take 324 mg by mouth 2 (two) times daily.    [provider]  simvastatin (ZOCOR) 40 MG tablet Take 40 mg by mouth at bedtime.    [provider]  Tamsulosin HCl (FLOMAX) 0.4 MG CAPS Take 0.4 mg by mouth 2 (two) times daily.     [provider]    Allergies Amiodarone  Family History  Problem Relation Age of Onset  . Heart failure Mother        congestive  .  Heart attack Brother 26       hx of CABG    Social History Social History   Tobacco Use  . Smoking status: Former Smoker    Packs/day: 1.00    Years: 50.00    Pack years: 50.00    Types: Cigarettes    Last attempt to quit: 05/05/2002    Years since quitting: 16.2  . Smokeless tobacco: Never Used  Substance Use Topics  . Alcohol use: Yes    Comment: 3 beers weekly  . Drug use: No    Review of Systems Constitutional: No fever/chills Eyes: No visual changes. ENT: No sore throat. Cardiovascular: Chest pain as described above. Respiratory: Denies shortness of breath. Gastrointestinal: No abdominal pain.  No nausea, no vomiting.  No diarrhea.  No constipation. Genitourinary: Negative for dysuria. Musculoskeletal: Negative for neck pain.  Negative for back pain. Integumentary: Negative for rash. Neurological: Negative for headaches, focal weakness or numbness.   ____________________________________________   PHYSICAL EXAM:  VITAL SIGNS: ED Triage Vitals  Enc Vitals Group     BP 07/27/18 0445 129/71     Pulse Rate 07/27/18 0445 77     Resp 07/27/18 0445 (!) 22     Temp 07/27/18 0445 98.7 F (37.1 C)     Temp Source 07/27/18 0445 Oral     SpO2 07/27/18 0445 99 %     Weight --      Height --      Head Circumference --      Peak Flow --      Pain Score 07/27/18 0500 0     Pain Loc --      Pain Edu? --      Excl. in GC? --     Constitutional: Alert and oriented. Well appearing and in no acute distress.  Eyes: Conjunctivae are normal.  Head: Atraumatic. Nose: No congestion/rhinnorhea. Mouth/Throat: Mucous membranes are moist. Neck: No stridor.  No meningeal signs.   Cardiovascular: Normal rate, regular rhythm. Good peripheral circulation. Grossly normal heart sounds.  Pacer/defibrillator is present in the left side of the chest. Respiratory: Normal respiratory effort.  No retractions. Lungs CTAB. Gastrointestinal: Soft and nontender. No distention.  Musculoskeletal: No lower extremity tenderness nor edema. No gross deformities of extremities. Neurologic:  Normal speech and language. No gross focal neurologic deficits are appreciated.  Skin:  Skin is warm, dry and intact. No rash noted. Psychiatric: Mood and affect are normal. Speech and behavior are normal.  ____________________________________________   LABS (all labs ordered are listed, but only abnormal results are displayed)  Labs Reviewed  BASIC METABOLIC PANEL - Abnormal; Notable for the following components:      Result Value   Glucose, Bld 115 (*)    Creatinine, Ser 1.58 (*)    Calcium 8.5 (*)    GFR calc non Af Amer 43 (*)    GFR calc Af Amer 50 (*)    All other components within normal limits  CBC - Abnormal; Notable for the following components:   RDW 15.9 (*)    All other components within normal limits  TROPONIN I  PROTIME-INR  HEPATIC FUNCTION PANEL  LIPASE, BLOOD  TROPONIN I   ____________________________________________  EKG  ED ECG REPORT I, Loleta Rose, the attending physician, personally viewed and interpreted this ECG.  Date: 07/27/2018 EKG Time: 4:45 AM Rate: 77 Rhythm: Atrial fibrillation with some pacemaker spikes QRS Axis: Left axis deviation Intervals: Right bundle branch block ST/T Wave abnormalities: Non-specific ST segment /  T-wave changes, but no clear evidence of acute ischemia. Narrative Interpretation: no definitive evidence of acute ischemia; does not meet STEMI criteria.  ED ECG  REPORT I, Loleta Rose, the attending physician, personally viewed and interpreted this ECG.  Date: 07/27/2018 EKG Time: 4:46 AM Rate: 84 Rhythm: A. fib/flutter with paced rhythm QRS Axis: Left axis deviation Intervals: Paced rhythm with underlying A. fib/flutter ST/T Wave abnormalities: Non-specific ST segment / T-wave changes, but no clear evidence of acute ischemia. Narrative Interpretation: no definitive evidence of acute ischemia; does not meet STEMI criteria.    ____________________________________________  RADIOLOGY I, Loleta Rose, personally viewed and evaluated these images (plain radiographs) as part of my medical decision making, as well as reviewing the written report by the radiologist.  ED MD interpretation: No acute abnormality on chest x-ray.  Medtronic pacer/defibrillator is present.  Official radiology report(s): Dg Chest 1 View  Result Date: 07/27/2018 CLINICAL DATA:  Chest pain EXAM: CHEST  1 VIEW COMPARISON:  Chest radiograph 11/15/2017 FINDINGS: Unchanged position of left chest wall pacemaker leads. Remote median sternotomy. Bibasilar atelectasis without focal consolidation or pulmonary edema. IMPRESSION: No active disease. Electronically Signed   By: Deatra Robinson M.D.   On: 07/27/2018 05:02    ____________________________________________   PROCEDURES   Procedure(s) performed (including Critical Care):  Procedures   ____________________________________________   INITIAL IMPRESSION / MDM / ASSESSMENT AND PLAN / ED COURSE  As part of my medical decision making, I reviewed the following data within the electronic MEDICAL RECORD NUMBER Nursing notes reviewed and incorporated, Labs reviewed , EKG interpreted , Old chart reviewed, Patient signed out to Dr. Cyril Loosen,  and Reviewed Notes from prior ED visits         Differential diagnosis includes, but is not limited to, angina, ACS including unstable angina and NSTEMI, complications from atrial  fibrillation/flutter, PE, pericarditis/myocarditis, pneumonia.  The patient is well-appearing, conversant, no acute distress, and in fact no pain at this time.  Vital signs are stable and within normal limits.  The patient's EKGs are somewhat variable but are consistent with an a flutter/fib with pacer spikes over the top.  I believe when he says that his pacemaker and heart are out of sync, he is likely referring to the fact that he has atrial fibrillation and he can feel palpitations at times with the pacer spikes but then intermittent atrial beats.  He is adamant that he is not felt any sort of a shock, the pain that awoke him was sharp and substernal.  His initial work-up is reassuring with a normal CBC, and essentially normal basic metabolic panel except for creatinine of 1.58, a negative troponin, normal lipase, normal hepatic function panel.  The patient should not be having any blood clots given that he is on Pradaxa and Plavix and likely also taking aspirin still.  He has no signs of infection.  He does have an extensive coronary artery history I think it is very likely what he was experiencing was angina.  However his symptoms have completely resolved with no concerning findings on the EKG.  I asked him his preference; I told [facility] based on his history and symptoms, or we can try to rule him out or rule stratify him as best as possible (I explained this in layman's terms) by giving him a period of observation and check a second troponin and if the second troponin is negative and he is asymptomatic then  he can go home and follow-up as an outpatient.  He prefers the latter.  I think this is appropriate given that he has the ability [facility] and that he is on a a blood thinner and antiplatelet agent, and is asymptomatic.  I will sign the patient out to Dr. Cyril LoosenKinner at 7:00 AM to recheck a troponin at 8:00 and reassess the patient.  Clinical  Course as of Jul 27 623  Tue Jul 27, 2018  0558 Troponin I: <0.03 [CF]    Clinical Course User Index [CF] Loleta RoseForbach, Marai Teehan, MD    ____________________________________________  FINAL CLINICAL IMPRESSION(S) / ED DIAGNOSES  Final diagnoses:  Chest pain     MEDICATIONS GIVEN DURING THIS VISIT:  Medications  sodium chloride flush (NS) 0.9 % injection 3 mL (3 mLs Intravenous Given 07/27/18 0459)     ED Discharge Orders    None       Note:  This document was prepared using Dragon voice recognition software and may include unintentional dictation errors.   Loleta RoseForbach, Alsace Dowd, MD 07/27/18 [phone]

## 2018-07-27 NOTE — ED Triage Notes (Addendum)
Pt c/o sudden onset central chest pain that started approximately 2 hours ago. Pt woke with pain and administered 1 nitroglycerin tablet with relief. Pt to ED due to cardiac hx. Pt denies nausea but has SOB. Pt sts his defibrillator and heart are not "inscenc."

## 2018-07-27 NOTE — ED Notes (Signed)
PT verbalizes d/c understanding and follow up. Pt in NAD, pt ambulatory, VSS. PT waiting for ride in lobby. Pt unable to sign due to signature pad malfnx

## 2018-11-04 ENCOUNTER — Other Ambulatory Visit: Payer: Self-pay

## 2018-11-04 ENCOUNTER — Encounter: Payer: Self-pay | Admitting: Podiatry

## 2018-11-04 ENCOUNTER — Ambulatory Visit (INDEPENDENT_AMBULATORY_CARE_PROVIDER_SITE_OTHER): Payer: No Typology Code available for payment source | Admitting: Podiatry

## 2018-11-04 DIAGNOSIS — M79675 Pain in left toe(s): Secondary | ICD-10-CM

## 2018-11-04 DIAGNOSIS — M79674 Pain in right toe(s): Secondary | ICD-10-CM | POA: Diagnosis not present

## 2018-11-04 DIAGNOSIS — D689 Coagulation defect, unspecified: Secondary | ICD-10-CM | POA: Diagnosis not present

## 2018-11-04 DIAGNOSIS — B351 Tinea unguium: Secondary | ICD-10-CM | POA: Diagnosis not present

## 2018-11-04 NOTE — Progress Notes (Signed)
Complaint:  Visit Type: Patient presents  to my office for  preventative foot care services. Complaint: Patient states" my nails have grown long and thick and become painful to walk and wear shoes" Patient has been diagnosed taking plavix.. The patient presents for preventative foot care services. No changes to ROS  Podiatric Exam: Vascular: dorsalis pedis and posterior tibial pulses are palpable bilateral. Capillary return is immediate. Temperature gradient is WNL. Skin turgor WNL  Sensorium: Normal Semmes Weinstein monofilament test. Normal tactile sensation bilaterally. Nail Exam: Pt has thick disfigured discolored nails with subungual debris noted bilateral hallux nail. Ulcer Exam: There is no evidence of ulcer or pre-ulcerative changes or infection. Orthopedic Exam: Muscle tone and strength are WNL. No limitations in general ROM. No crepitus or effusions noted. Foot type and digits show no abnormalities. Bony prominences are unremarkable. Skin: No Porokeratosis. No infection or ulcers  Diagnosis:  Onychomycosis, , Pain in right toe, pain in left toes  Treatment & Plan    IE. Consent by patient was obtained for treatment procedures.   Debridement of mycotic and hypertrophic toenails, 1 through 5 bilateral and clearing of subungual debris. No ulceration, no infection noted.  Return Visit-Office Procedure: Patient instructed to return to the office for a follow up visit 3 months for continued evaluation and treatment.    Gardiner Barefoot DPM

## 2018-12-20 ENCOUNTER — Other Ambulatory Visit: Payer: Self-pay

## 2018-12-20 ENCOUNTER — Ambulatory Visit (INDEPENDENT_AMBULATORY_CARE_PROVIDER_SITE_OTHER): Payer: No Typology Code available for payment source | Admitting: Podiatry

## 2018-12-20 ENCOUNTER — Encounter: Payer: Self-pay | Admitting: Podiatry

## 2018-12-20 DIAGNOSIS — B351 Tinea unguium: Secondary | ICD-10-CM

## 2018-12-20 DIAGNOSIS — L600 Ingrowing nail: Secondary | ICD-10-CM | POA: Insufficient documentation

## 2018-12-20 DIAGNOSIS — D689 Coagulation defect, unspecified: Secondary | ICD-10-CM

## 2018-12-20 DIAGNOSIS — M79675 Pain in left toe(s): Secondary | ICD-10-CM

## 2018-12-20 DIAGNOSIS — M79674 Pain in right toe(s): Secondary | ICD-10-CM

## 2018-12-20 NOTE — Progress Notes (Signed)
Complaint:  Visit Type: Patient presents  to my office saying he has a painful ingrowing toenail in the inside border left big toe.  He says it has become painful and he has occasionally soaked the toe in Epson salts.  He says that it is painful walking and wearing his shoes.  The only shoes that really do not hurt are his sandals.  He was seen in the office approximately 6 weeks ago for preventative foot care services.  Podiatric Exam: Vascular: dorsalis pedis and posterior tibial pulses are palpable bilateral. Capillary return is immediate. Temperature gradient is WNL. Skin turgor WNL  Sensorium: Normal Semmes Weinstein monofilament test. Normal tactile sensation bilaterally. Nail Exam: Pt has thick disfigured discolored nails with subungual debris noted bilateral hallux nail..  Marked incurvation noted medial border left great toe.  No evidence of any redness swelling or drainage. Ulcer Exam: There is no evidence of ulcer or pre-ulcerative changes or infection. Orthopedic Exam: Muscle tone and strength are WNL. No limitations in general ROM. No crepitus or effusions noted. Foot type and digits show no abnormalities. Bony prominences are unremarkable. Skin: No Porokeratosis. No infection or ulcers  Diagnosis:  Onychomycosis, , Pain in right toe, pain in left toes  Treatment & Plan    IE. Consent by patient was obtained for treatment procedures.   Removal of nail spicule medial border left great toe.  Discussed possible nail surgery for the correction of this problem.  Patient is scheduled for heart surgery and therefore desires conservative treatment today.   Return Visit-Office Procedure: Patient instructed to return to the office for a follow up visit 10 weeks  for continued evaluation and treatment.    Gardiner Barefoot DPM

## 2019-02-03 ENCOUNTER — Ambulatory Visit: Payer: Medicare Other | Admitting: Podiatry

## 2019-02-28 ENCOUNTER — Ambulatory Visit: Payer: Medicare Other | Admitting: Podiatry

## 2019-04-05 ENCOUNTER — Inpatient Hospital Stay (HOSPITAL_COMMUNITY)
Admission: EM | Admit: 2019-04-05 | Discharge: 2019-04-06 | DRG: 309 | Disposition: A | Discharge status: Home / Self Care | Payer: No Typology Code available for payment source | Attending: Cardiovascular Disease | Admitting: Cardiovascular Disease

## 2019-04-05 ENCOUNTER — Emergency Department (HOSPITAL_COMMUNITY): Payer: No Typology Code available for payment source

## 2019-04-05 ENCOUNTER — Encounter (HOSPITAL_COMMUNITY): Payer: Self-pay

## 2019-04-05 DIAGNOSIS — Z79899 Other long term (current) drug therapy: Secondary | ICD-10-CM

## 2019-04-05 DIAGNOSIS — I5022 Chronic systolic (congestive) heart failure: Secondary | ICD-10-CM | POA: Diagnosis present

## 2019-04-05 DIAGNOSIS — I472 Ventricular tachycardia, unspecified: Secondary | ICD-10-CM

## 2019-04-05 DIAGNOSIS — I4729 Other ventricular tachycardia: Secondary | ICD-10-CM

## 2019-04-05 DIAGNOSIS — E785 Hyperlipidemia, unspecified: Secondary | ICD-10-CM | POA: Diagnosis present

## 2019-04-05 DIAGNOSIS — N289 Disorder of kidney and ureter, unspecified: Secondary | ICD-10-CM

## 2019-04-05 DIAGNOSIS — Z4502 Encounter for adjustment and management of automatic implantable cardiac defibrillator: Secondary | ICD-10-CM

## 2019-04-05 DIAGNOSIS — Z7901 Long term (current) use of anticoagulants: Secondary | ICD-10-CM

## 2019-04-05 DIAGNOSIS — Z8249 Family history of ischemic heart disease and other diseases of the circulatory system: Secondary | ICD-10-CM

## 2019-04-05 DIAGNOSIS — Z955 Presence of coronary angioplasty implant and graft: Secondary | ICD-10-CM

## 2019-04-05 DIAGNOSIS — I255 Ischemic cardiomyopathy: Secondary | ICD-10-CM | POA: Diagnosis present

## 2019-04-05 DIAGNOSIS — Z951 Presence of aortocoronary bypass graft: Secondary | ICD-10-CM

## 2019-04-05 DIAGNOSIS — Z7902 Long term (current) use of antithrombotics/antiplatelets: Secondary | ICD-10-CM

## 2019-04-05 DIAGNOSIS — Z7982 Long term (current) use of aspirin: Secondary | ICD-10-CM

## 2019-04-05 DIAGNOSIS — I4891 Unspecified atrial fibrillation: Secondary | ICD-10-CM | POA: Diagnosis present

## 2019-04-05 DIAGNOSIS — Z95810 Presence of automatic (implantable) cardiac defibrillator: Secondary | ICD-10-CM

## 2019-04-05 DIAGNOSIS — Z20828 Contact with and (suspected) exposure to other viral communicable diseases: Secondary | ICD-10-CM | POA: Diagnosis present

## 2019-04-05 DIAGNOSIS — Z87891 Personal history of nicotine dependence: Secondary | ICD-10-CM

## 2019-04-05 DIAGNOSIS — I4901 Ventricular fibrillation: Secondary | ICD-10-CM | POA: Diagnosis not present

## 2019-04-05 DIAGNOSIS — I2510 Atherosclerotic heart disease of native coronary artery without angina pectoris: Secondary | ICD-10-CM | POA: Diagnosis present

## 2019-04-05 LAB — CBC
HCT: 43.9 % (ref 39.0–52.0)
Hemoglobin: 14.3 g/dL (ref 13.0–17.0)
MCH: 31.3 pg (ref 26.0–34.0)
MCHC: 32.6 g/dL (ref 30.0–36.0)
MCV: 96.1 fL (ref 80.0–100.0)
Platelets: 197 10*3/uL (ref 150–400)
RBC: 4.57 MIL/uL (ref 4.22–5.81)
RDW: 15.9 % — ABNORMAL HIGH (ref 11.5–15.5)
WBC: 6.1 10*3/uL (ref 4.0–10.5)
nRBC: 0 % (ref 0.0–0.2)

## 2019-04-05 LAB — BASIC METABOLIC PANEL
Anion gap: 10 (ref 5–15)
BUN: 22 mg/dL (ref 8–23)
CO2: 27 mmol/L (ref 22–32)
Calcium: 9.8 mg/dL (ref 8.9–10.3)
Chloride: 106 mmol/L (ref 98–111)
Creatinine, Ser: 1.5 mg/dL — ABNORMAL HIGH (ref 0.61–1.24)
GFR calc Af Amer: 53 mL/min — ABNORMAL LOW (ref >60)
GFR calc non Af Amer: 46 mL/min — ABNORMAL LOW (ref >60)
Glucose, Bld: 121 mg/dL — ABNORMAL HIGH (ref 70–99)
Potassium: 5.3 mmol/L — ABNORMAL HIGH (ref 3.5–5.1)
Sodium: 143 mmol/L (ref 135–145)

## 2019-04-05 LAB — TROPONIN I (HIGH SENSITIVITY)
Troponin I (High Sensitivity): 16 ng/L (ref <18)
Troponin I (High Sensitivity): 18 ng/L — ABNORMAL HIGH (ref <18)

## 2019-04-05 MED ORDER — SODIUM CHLORIDE 0.9% FLUSH: 3.0000 mL | Freq: Once | INTRAVENOUS | Status: DC

## 2019-04-05 NOTE — ED Notes (Signed)
Pt states his internal defibrillator fired at 2:30 PM and 6:30 PM today. Pt stated he felt dizzy before it fired both times. Pt denies any chest pain.

## 2019-04-05 NOTE — ED Notes (Signed)
Pt has medtronic defibrillator

## 2019-04-05 NOTE — ED Triage Notes (Signed)
Pt comes via [facility], pt has defibrillator that has fired twice today. Denies CP/sob, pt fell after the second fall, no injuries

## 2019-04-05 NOTE — ED Notes (Signed)
Spoke with Andrew [name] at [employer].  Interrogation shows two VF episodes today, shocks delivered at 15:31 and 19:44 at 135 J and both shocks were successful. The interrogation also showed a high atrial threshold. The LV lead has not been detected the past 7 days.

## 2019-04-05 NOTE — ED Notes (Signed)
[employer] defibrillator interrogated. Data sent.

## 2019-04-05 NOTE — ED Provider Notes (Signed)
[facility] Provider Note   CSN: [id number] Arrival date & time: 04/05/19  2002    History   Chief Complaint Chief Complaint  Patient presents with  . Chest Pain    HPI Andrew [name] is a 73 y.o. male.   The history is provided by the patient.  Chest Pain He has history of hypertension, hyperlipidemia, coronary artery disease, chronic systolic heart failure, ventricular tachycardia status post pacemaker defibrillator insertion and comes in with his defibrillator having fired twice today.  Both occasions occurred at rest or with minimal exertion.  Both episodes were preceded by an episode of lightheadedness but no sense of heart racing.  There was no chest pain or dyspnea.  It has been several years since the last time his defibrillator had fired.  Past Medical History:  Diagnosis Date  . Atrial fibrillation (Andrew [name])   . CAD (coronary artery disease)    a. s/p CABG 1997;  b. s/p PCI to Andrew [name] 2006;  c. s/p BMS x 3 to Andrew [name] 10/10;  d.  Myoview 2010: EF 24%, basal inf, mid inf. apical inf, basal inf-lat and mid IL scar, no isch;  e. cath 1/11: Andrew [name] occluded, S-OM chronically occluded, L-LAD ok with L-R collaterals (med. Tx)  . HLD (hyperlipidemia)   . HTN (hypertension)   . Ischemic cardiomyopathy    a. EF 25-35%;   b. echo 3/11: EF 35%, mild LVH, severe inf/post HK, lat and apical HK, grade 1 diast dysfxn, mild AI, mild to mod MR, PASP 32  . Systolic CHF, chronic (Andrew [name])   . Ventricular tachycardia (Andrew [name])    a. s/p AICD 2006;   b. Amio intolerant due to liver and thyroid issues;   c. 2/12:  quinidine added to mexilitine  (consider Tikosyn in future??)    Patient Active Problem List   Diagnosis Date Noted  . Ingrowing nail 12/20/2018  . Pain due to onychomycosis of toenails of both feet 11/04/2018  . Coagulation disorder (Andrew [name]) 11/04/2018  . Acute renal failure (ARF) (Andrew [name]) 06/15/2015  . Hyponatremia 06/15/2015  . Visual hallucinations  06/15/2015  . Chronic systolic heart failure (Andrew [name]) 08/30/2010  . HYPOTENSION 07/23/2009  . TRANSAMINASES, SERUM, ELEVATED 07/23/2009  . HYPERCHOLESTEROLEMIA  IIA 06/21/2009  . CAD, ARTERY BYPASS GRAFT 06/21/2009  . CARDIOMYOPATHY, ISCHEMIC 06/21/2009  . AV BLOCK, 1ST DEGREE 06/21/2009  . 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 VENTRICULAR TACHYCARDIA 06/21/2009  . DYSPNEA ON EXERTION 06/21/2009  . ICD - IN SITU 06/21/2009    Past Surgical History:  Procedure Laterality Date  . BLADDER TUMOR EXCISION    . CARDIAC CATHETERIZATION  2006,02/2009  . CORONARY ARTERY BYPASS GRAFT  1997  . Medtronic implantable cardioverter-dibrillator   2006   with sprint fidelis lead        Home Medications    Prior to Admission medications   Medication Sig Start Date End Date Taking? Authorizing Provider  acetaminophen (TYLENOL) 325 MG tablet Take 975 mg by mouth 3 (three) times daily.     [provider]  aspirin EC 81 MG tablet Take 81 mg by mouth at bedtime.    [provider]  carvedilol (COREG) 12.5 MG tablet Take 12.5 mg by mouth 2 (two) times daily with a meal.      [provider]  cefUROXime (CEFTIN) 500 MG tablet Take 1 tablet (500 mg total) by mouth 2 (two) times daily with a meal. 06/18/15   Hower, Aaron Mose,  MD  citalopram (CELEXA) 20 MG tablet Take 20 mg by mouth at bedtime.    [provider]  clopidogrel (PLAVIX) 75 MG tablet Take 75 mg by mouth at bedtime.    [provider]  dabigatran (PRADAXA) 150 MG CAPS capsule Take 150 mg by mouth 2 (two) times daily.    [provider]  finasteride (PROSCAR) 5 MG tablet Take 5 mg by mouth daily.    [provider]  fluconazole (DIFLUCAN) 200 MG tablet Take 200 mg by mouth daily.    [provider]  furosemide (LASIX) 20 MG tablet Take 20 mg by mouth at bedtime.    [provider]  gabapentin (NEURONTIN) 400 MG capsule Take 1,200 mg by mouth 3  (three) times daily.    [provider]  hydrocerin (EUCERIN) CREA Apply 1 application topically 2 (two) times daily as needed (for irritation).    [provider]  hydrocortisone 2.5 % ointment Apply 1 application topically 2 (two) times daily as needed (for itching).    [provider]  isosorbide mononitrate (IMDUR) 30 MG 24 hr tablet Take 30 mg by mouth daily as needed (for increased BP).    [provider]  lisinopril (PRINIVIL,ZESTRIL) 10 MG tablet Take 10 mg by mouth daily.    [provider]  Menthol-Methyl Salicylate (THERA-GESIC EX) Apply 1 application topically 3 (three) times daily as needed (for pain).    [provider]  methocarbamol (ROBAXIN) 750 MG tablet Take 750 mg by mouth 2 (two) times daily as needed for muscle spasms.    [provider]  metoprolol succinate (TOPROL-XL) 25 MG 24 hr tablet Take 25 mg by mouth daily.    [provider]  mexiletine (MEXITIL) 250 MG capsule Take 250 mg by mouth 2 (two) times daily.      [provider]  niacin 500 MG tablet Take 500 mg by mouth at bedtime.    [provider]  nitroGLYCERIN (NITROSTAT) 0.4 MG SL tablet Place 0.4 mg under the tongue every 5 (five) minutes as needed for chest pain.     [provider]  Omega-3 Fatty Acids (FISH OIL) 1000 MG CAPS Take 1,000 mg by mouth at bedtime.    [provider]  omeprazole (PRILOSEC) 20 MG capsule Take 40 mg by mouth 2 (two) times daily before a meal.    [provider]  quiniDINE gluconate 324 MG CR tablet Take 324 mg by mouth 2 (two) times daily.    [provider]  simvastatin (ZOCOR) 40 MG tablet Take 40 mg by mouth at bedtime.    [provider]  Tamsulosin HCl (FLOMAX) 0.4 MG CAPS Take 0.4 mg by mouth 2 (two) times daily.     [provider]    Family History Family History  Problem Relation Age of Onset  . Heart failure Mother         congestive  . Heart attack Brother 53       hx of CABG    Social History Social History   Tobacco Use  . Smoking status: Former Smoker    Packs/day: 1.00    Years: 50.00    Pack years: 50.00    Types: Cigarettes    Quit date: 05/05/2002    Years since quitting: 16.9  . Smokeless tobacco: Never Used  Substance Use Topics  . Alcohol use: Yes    Comment: 3 beers weekly  . Drug use: No  Allergies   Amiodarone   Review of Systems Review of Systems  Cardiovascular: Positive for chest pain.  All other systems reviewed and are negative.    Physical Exam Updated Vital Signs BP 131/61   Pulse 82   Temp 98.6 F (37 C) (Oral)   Resp 20   SpO2 97%   Physical Exam Vitals signs and nursing note reviewed.    73 year old male, resting comfortably and in no acute distress. Vital signs are normal. Oxygen saturation is 97%, which is normal. Head is normocephalic and atraumatic. PERRLA, EOMI. Oropharynx is clear. Neck is nontender and supple without adenopathy or JVD. Back is nontender and there is no CVA tenderness. Lungs are clear without rales, wheezes, or rhonchi. Chest is nontender. Heart has regular rate and rhythm without murmur. Abdomen is soft, flat, nontender without masses or hepatosplenomegaly and peristalsis is normoactive. Extremities have trace edema, full range of motion is present. Skin is warm and dry without rash. Neurologic: Mental status is normal, cranial nerves are intact, there are no motor or sensory deficits.  ED Treatments / Results  Labs (all labs ordered are listed, but only abnormal results are displayed) Labs Reviewed  BASIC METABOLIC PANEL - Abnormal; Notable for the following components:      Result Value   Potassium 5.3 (*)    Glucose, Bld 121 (*)    Creatinine, Ser 1.50 (*)    GFR calc non Af Amer 46 (*)    GFR calc Af Amer 53 (*)    All other components within normal limits  CBC - Abnormal; Notable for the following components:    RDW 15.9 (*)    All other components within normal limits  TROPONIN I (HIGH SENSITIVITY) - Abnormal; Notable for the following components:   Troponin I (High Sensitivity) 18 (*)    All other components within normal limits  SARS CORONAVIRUS 2 (TAT 6-24 HRS)  MAGNESIUM  TROPONIN I (HIGH SENSITIVITY)    EKG EKG Interpretation  Date/Time:  Tuesday April 05 2019 20:17:28 EST Ventricular Rate:  81 PR Interval:  200 QRS Duration: 154 QT Interval:  440 QTC Calculation: 511 R Axis:   70 Text Interpretation: Atrial-sensed ventricular-paced rhythm Abnormal ECG paced rhythm present on previous Confirmed by Frederick Peers [phone]) on 04/05/2019 10:03:29 PM   EKG Interpretation  Date/Time:  Tuesday April 05 2019 22:47:44 EST Ventricular Rate:  80 PR Interval:  200 QRS Duration: 175 QT Interval:  441 QTC Calculation: 509 R Axis:   103 Text Interpretation: VENTRICULAR PACED RHYTHM When compared with ECG of EARLIER SAME DATE No P waves are seen Confirmed by Dione Booze ([id number]) on 04/05/2019 11:06:18 PM        Radiology Dg Chest 2 View  Result Date: 04/05/2019 CLINICAL DATA:  Chest pain EXAM: CHEST - 2 VIEW COMPARISON:  07/27/2018 FINDINGS: Left AICD remains in place, unchanged. Prior CABG. Heart is borderline enlarged. Lingular scarring. Right lung clear. No effusions or acute bony abnormality. IMPRESSION: No active cardiopulmonary disease. Electronically Signed   By: Charlett Nose M.D.   On: 04/05/2019 20:35    Procedures Procedures   Medications Ordered in ED Medications  sodium chloride flush (NS) 0.9 % injection 3 mL (has no administration in time range)  tamsulosin (FLOMAX) capsule 0.4 mg (has no administration in time range)  citalopram (CELEXA) tablet 20 mg (has no administration in time range)  lidocaine (LIDODERM) 5 % 1 patch (has no administration in time range)  clopidogrel (PLAVIX) tablet 75  mg (has no administration in time range)  dabigatran (PRADAXA) capsule  150 mg (has no administration in time range)  gabapentin (NEURONTIN) capsule 400 mg (has no administration in time range)  methocarbamol (ROBAXIN) tablet 750 mg (has no administration in time range)  mexiletine (MEXITIL) capsule 250 mg (has no administration in time range)  niacin tablet 500 mg (has no administration in time range)  quiniDINE gluconate CR tablet 324 mg (has no administration in time range)  simvastatin (ZOCOR) tablet 40 mg (has no administration in time range)  omega-3 acid ethyl esters (LOVAZA) capsule 1 g (has no administration in time range)     Initial Impression / Assessment and Plan / ED Course  I have reviewed the triage vital signs and the nursing notes.  Pertinent labs & imaging results that were available during my care of the patient were reviewed by me and considered in my medical decision making (see chart for details).  Defibrillator fired x2, probable recurrent ventricular tachycardia paced on lightheadedness prior to device firing.  Will interrogate his defibrillator.  Labs show stable renal insufficiency and slight hyperkalemia which is not felt to be clinically significant.  Will check magnesium level as well.  Chest x-ray shows no acute process, ECG shows 100% paced rhythm.  Defibrillator interrogation shows 2 episodes of ventricular fibrillation terminated by shocks.  Given 2 episodes of ventricular fibrillation in 1 day, will plan to admit for adjustment of antiarrhythmic medications.  Case is discussed with Dr. Deforest HoylesAkhter of cardiology service who agrees to come to evaluate the patient for possible admission.  Final Clinical Impressions(s) / ED Diagnoses   Final diagnoses:  Defibrillator discharge  Ventricular fibrillation [name](HCC)  Renal insufficiency    ED Discharge Orders    None       Dione BoozeGlick, Emillia Weatherly, MD 04/06/19 [phone]

## 2019-04-06 ENCOUNTER — Inpatient Hospital Stay (HOSPITAL_COMMUNITY): Payer: No Typology Code available for payment source

## 2019-04-06 ENCOUNTER — Other Ambulatory Visit: Payer: Self-pay

## 2019-04-06 DIAGNOSIS — Z4502 Encounter for adjustment and management of automatic implantable cardiac defibrillator: Secondary | ICD-10-CM | POA: Diagnosis not present

## 2019-04-06 DIAGNOSIS — Z20828 Contact with and (suspected) exposure to other viral communicable diseases: Secondary | ICD-10-CM | POA: Diagnosis present

## 2019-04-06 DIAGNOSIS — Z7982 Long term (current) use of aspirin: Secondary | ICD-10-CM | POA: Diagnosis not present

## 2019-04-06 DIAGNOSIS — E785 Hyperlipidemia, unspecified: Secondary | ICD-10-CM | POA: Diagnosis present

## 2019-04-06 DIAGNOSIS — Z87891 Personal history of nicotine dependence: Secondary | ICD-10-CM | POA: Diagnosis not present

## 2019-04-06 DIAGNOSIS — I255 Ischemic cardiomyopathy: Secondary | ICD-10-CM | POA: Diagnosis present

## 2019-04-06 DIAGNOSIS — I340 Nonrheumatic mitral (valve) insufficiency: Secondary | ICD-10-CM | POA: Diagnosis not present

## 2019-04-06 DIAGNOSIS — I4901 Ventricular fibrillation: Secondary | ICD-10-CM | POA: Diagnosis present

## 2019-04-06 DIAGNOSIS — Z7902 Long term (current) use of antithrombotics/antiplatelets: Secondary | ICD-10-CM | POA: Diagnosis not present

## 2019-04-06 DIAGNOSIS — Z7901 Long term (current) use of anticoagulants: Secondary | ICD-10-CM | POA: Diagnosis not present

## 2019-04-06 DIAGNOSIS — Z79899 Other long term (current) drug therapy: Secondary | ICD-10-CM | POA: Diagnosis not present

## 2019-04-06 DIAGNOSIS — Z8249 Family history of ischemic heart disease and other diseases of the circulatory system: Secondary | ICD-10-CM | POA: Diagnosis not present

## 2019-04-06 DIAGNOSIS — I2510 Atherosclerotic heart disease of native coronary artery without angina pectoris: Secondary | ICD-10-CM | POA: Diagnosis present

## 2019-04-06 DIAGNOSIS — Z951 Presence of aortocoronary bypass graft: Secondary | ICD-10-CM | POA: Diagnosis not present

## 2019-04-06 DIAGNOSIS — I4891 Unspecified atrial fibrillation: Secondary | ICD-10-CM | POA: Diagnosis present

## 2019-04-06 DIAGNOSIS — I472 Ventricular tachycardia, unspecified: Secondary | ICD-10-CM

## 2019-04-06 DIAGNOSIS — Z95810 Presence of automatic (implantable) cardiac defibrillator: Secondary | ICD-10-CM | POA: Diagnosis not present

## 2019-04-06 DIAGNOSIS — I5022 Chronic systolic (congestive) heart failure: Secondary | ICD-10-CM | POA: Diagnosis present

## 2019-04-06 DIAGNOSIS — Z955 Presence of coronary angioplasty implant and graft: Secondary | ICD-10-CM | POA: Diagnosis not present

## 2019-04-06 LAB — ECHOCARDIOGRAM COMPLETE
Height: 68 in
Weight: 3312 oz

## 2019-04-06 LAB — MAGNESIUM: Magnesium: 2 mg/dL (ref 1.7–2.4)

## 2019-04-06 LAB — SARS CORONAVIRUS 2 (TAT 6-24 HRS): SARS Coronavirus 2: NEGATIVE

## 2019-04-06 MED ORDER — METHOCARBAMOL 500 MG PO TABS
750.0000 mg | ORAL_TABLET | Freq: Once | ORAL | Status: AC
  Administered 2019-04-06: 750 mg via ORAL
  Filled 2019-04-06: qty 2

## 2019-04-06 MED ORDER — GABAPENTIN 400 MG PO CAPS: 400.0000 mg | ORAL_CAPSULE | Freq: Once | ORAL | Status: DC

## 2019-04-06 MED ORDER — CITALOPRAM HYDROBROMIDE 10 MG PO TABS: 20.0000 mg | ORAL_TABLET | Freq: Once | ORAL | Status: DC

## 2019-04-06 MED ORDER — CLOPIDOGREL BISULFATE 75 MG PO TABS
75.0000 mg | ORAL_TABLET | Freq: Once | ORAL | Status: AC
  Administered 2019-04-06: 75 mg via ORAL
  Filled 2019-04-06: qty 1

## 2019-04-06 MED ORDER — QUINIDINE GLUCONATE ER 324 MG PO TBCR
324.0000 mg | EXTENDED_RELEASE_TABLET | Freq: Once | ORAL | Status: DC
  Filled 2019-04-06: qty 1

## 2019-04-06 MED ORDER — CARVEDILOL 12.5 MG PO TABS: 12.5000 mg | ORAL_TABLET | Freq: Two times a day (BID) | ORAL | Status: DC

## 2019-04-06 MED ORDER — ONDANSETRON HCL 4 MG/2ML IJ SOLN: 4.0000 mg | Freq: Four times a day (QID) | INTRAMUSCULAR | Status: DC | PRN

## 2019-04-06 MED ORDER — PERFLUTREN LIPID MICROSPHERE
1.0000 mL | INTRAVENOUS | Status: AC | PRN
  Administered 2019-04-06: 3 mL via INTRAVENOUS
  Filled 2019-04-06: qty 10

## 2019-04-06 MED ORDER — LIDOCAINE 5 % EX PTCH: 1.0000 | MEDICATED_PATCH | CUTANEOUS | Status: DC

## 2019-04-06 MED ORDER — SODIUM CHLORIDE 0.9 % IV SOLN: 250.0000 mL | INTRAVENOUS | Status: DC | PRN

## 2019-04-06 MED ORDER — SIMVASTATIN 20 MG PO TABS: 40.0000 mg | ORAL_TABLET | Freq: Every day | ORAL | Status: DC

## 2019-04-06 MED ORDER — MEXILETINE HCL 250 MG PO CAPS
250.0000 mg | ORAL_CAPSULE | Freq: Once | ORAL | Status: AC
  Administered 2019-04-06: 250 mg via ORAL
  Filled 2019-04-06: qty 1

## 2019-04-06 MED ORDER — QUINIDINE SULFATE 300 MG PO TABS
300.0000 mg | ORAL_TABLET | Freq: Once | ORAL | Status: DC
  Filled 2019-04-06: qty 1

## 2019-04-06 MED ORDER — ACETAMINOPHEN 325 MG PO TABS: 650.0000 mg | ORAL_TABLET | ORAL | Status: DC | PRN

## 2019-04-06 MED ORDER — NIACIN 500 MG PO TABS
500.0000 mg | ORAL_TABLET | Freq: Once | ORAL | Status: AC
  Administered 2019-04-06: 500 mg via ORAL
  Filled 2019-04-06: qty 1

## 2019-04-06 MED ORDER — TAMSULOSIN HCL 0.4 MG PO CAPS
0.4000 mg | ORAL_CAPSULE | Freq: Once | ORAL | Status: AC
  Administered 2019-04-06: 0.4 mg via ORAL
  Filled 2019-04-06: qty 1

## 2019-04-06 MED ORDER — MEXILETINE HCL 150 MG PO CAPS: 300.0000 mg | ORAL_CAPSULE | Freq: Three times a day (TID) | ORAL | 3 refills | Status: AC

## 2019-04-06 MED ORDER — METOPROLOL SUCCINATE ER 25 MG PO TB24
25.0000 mg | ORAL_TABLET | Freq: Every day | ORAL | Status: DC
  Administered 2019-04-06: 25 mg via ORAL
  Filled 2019-04-06: qty 1

## 2019-04-06 MED ORDER — DABIGATRAN ETEXILATE MESYLATE 150 MG PO CAPS
150.0000 mg | ORAL_CAPSULE | Freq: Once | ORAL | Status: AC
  Administered 2019-04-06: 150 mg via ORAL
  Filled 2019-04-06: qty 1

## 2019-04-06 MED ORDER — LIDOCAINE 5 % EX PTCH
1.0000 | MEDICATED_PATCH | Freq: Once | CUTANEOUS | Status: AC
  Administered 2019-04-06: 1 via TRANSDERMAL
  Filled 2019-04-06: qty 1

## 2019-04-06 MED ORDER — SODIUM CHLORIDE 0.9% FLUSH
3.0000 mL | Freq: Two times a day (BID) | INTRAVENOUS | Status: DC
  Administered 2019-04-06: 3 mL via INTRAVENOUS

## 2019-04-06 MED ORDER — SODIUM CHLORIDE 0.9% FLUSH: 3.0000 mL | INTRAVENOUS | Status: DC | PRN

## 2019-04-06 MED ORDER — OXYMETAZOLINE HCL 0.05 % NA SOLN
1.0000 | Freq: Two times a day (BID) | NASAL | Status: DC
  Administered 2019-04-06: 1 via NASAL
  Filled 2019-04-06: qty 30

## 2019-04-06 MED ORDER — MEXILETINE HCL 150 MG PO CAPS
300.0000 mg | ORAL_CAPSULE | Freq: Three times a day (TID) | ORAL | Status: DC
  Filled 2019-04-06 (×2): qty 2

## 2019-04-06 MED ORDER — OMEGA-3-ACID ETHYL ESTERS 1 G PO CAPS
1.0000 g | ORAL_CAPSULE | Freq: Once | ORAL | Status: AC
  Administered 2019-04-06: 1 g via ORAL
  Filled 2019-04-06: qty 1

## 2019-04-06 NOTE — Consult Note (Addendum)
ELECTROPHYSIOLOGY CONSULT NOTE    Patient ID: Andrew [name] MRN: [id number], DOB/AGE: [birth year] 73 y.o.  Admit date: 04/05/2019 Date of Consult: 04/06/2019  Primary Physician: Dellia NimsKeplinger, Lynn E, MD Primary Cardiologist: No primary care provider on file.  Electrophysiologist: Previously followed by Dr. Graciela HusbandsKlein, last seen in 2012, so new patient. Follows chron[facility] at [facility].   Referring Provider: Dr. Flora Lipps'Neal  Patient Profile: Andrew [name] is a 73 y.o. male with a history of CAD s/p CABG 1997 and subsequent PCIs, ICM (EF reported 25-30%, repeat pending), h/o VT on AAD, h/o AF, HTN, HLD, and tobacco abuse who is being seen today for the evaluation of ICD shock at the request of Dr. Flora Lipps'Neal.  HPI:  Andrew [name] is a 73 y.o. male medical history as above. He has a history of recurrent VT. Amiodarone previously stopped due to liver and thyroid side effects. On Pradaxa for AF. He is chronically disabled from back issues and due to Agent Orange Exposure; at baseline, only walks 25-30 yards before fatigue.   [facility] with dizziness followed by ICD shock x 2. ICD interrogation show appropriate response and shocks for VF. HS troponin mildly elevated without trend. K 5.3, Cr 1.5.  He denies chest pain, palpitations, dyspnea, PND, orthopnea, nausea, vomiting, dizziness, syncope, edema, weight gain, or early satiety.  He was feeling his usually state of health until yesterday.  He was getting coffee at a gas station when he felt dizziness and was shocked by his ICD.  He states his last shock was "years ago". [facility]  He has not missed any of his medications recently. He denies any recent illness.  Past Medical History:  Diagnosis Date  . Atrial fibrillation (HCC)   . CAD (coronary artery disease)    a. s/p CABG 1997;  b. s/p PCI to [facility] 2006;  c. s/p BMS x 3 to S-RCA 10/10;  d.  Myoview 2010: EF 24%, basal inf, mid inf. apical inf, basal inf-lat and mid  IL scar, no isch;  e. cath 1/11: S-RCA occluded, S-OM chronically occluded, L-LAD ok with L-R collaterals (med. Tx)  . HLD (hyperlipidemia)   . HTN (hypertension)   . Ischemic cardiomyopathy    a. EF 25-35%;   b. echo 3/11: EF 35%, mild LVH, severe inf/post HK, lat and apical HK, grade 1 diast dysfxn, mild AI, mild to mod MR, PASP 32  . Systolic CHF, chronic (HCC)   . Ventricular tachycardia (HCC)    a. s/p AICD 2006;   b. Amio intolerant due to liver and thyroid issues;   c. 2/12:  quinidine added to mexilitine  (consider Tikosyn in future??)     Surgical History:  Past Surgical History:  Procedure Laterality Date  . BLADDER TUMOR EXCISION    . CARDIAC CATHETERIZATION  2006,02/2009  . CORONARY ARTERY BYPASS GRAFT  1997  . Medtronic implantable cardioverter-dibrillator   2006   with sprint fidelis lead     (Not in a hospital admission)   Inpatient Medications:  . citalopram  20 mg Oral Once  . gabapentin  400 mg Oral Once  . lidocaine  1 patch Transdermal Once  . lidocaine  1 patch Transdermal Q24H  . oxymetazoline  1 spray Each Nare BID  . quiNIDine sulfate  300 mg Oral Once  . simvastatin  40 mg Oral q1800  . sodium chloride flush  3 mL Intravenous Once  . sodium chloride flush  3 mL Intravenous Q12H    Allergies:  Allergies  Allergen Reactions  . Amiodarone Other (See Comments)    Reaction:  Unknown     Social History   Socioeconomic History  . Marital status: Married    Spouse name: Not on file  . Number of children: Not on file  . Years of education: Not on file  . Highest education level: Not on file  Occupational History  . Not on file  Social Needs  . Financial resource strain: Not on file  . Food insecurity    Worry: Not on file    Inability: Not on file  . Transportation needs    Medical: Not on file    Non-medical: Not on file  Tobacco Use  . Smoking status: Former Smoker    Packs/day: 1.00    Years: 50.00    Pack years: 50.00    Types:  Cigarettes    Quit date: 05/05/2002    Years since quitting: 16.9  . Smokeless tobacco: Never Used  Substance and Sexual Activity  . Alcohol use: Yes    Comment: 3 beers weekly  . Drug use: No  . Sexual activity: Not on file  Lifestyle  . Physical activity    Days per week: Not on file    Minutes per session: Not on file  . Stress: Not on file  Relationships  . Social Herbalist on phone: Not on file    Gets together: Not on file    Attends religious service: Not on file    Active member of club or organization: Not on file    Attends meetings of clubs or organizations: Not on file    Relationship status: Not on file  . Intimate partner violence    Fear of current or ex partner: Not on file    Emotionally abused: Not on file    Physically abused: Not on file    Forced sexual activity: Not on file  Other Topics Concern  . Not on file  Social History Narrative  . Not on file     Family History  Problem Relation Age of Onset  . Heart failure Mother        congestive  . Heart attack Brother 76       hx of CABG     Review of Systems: All other systems reviewed and are otherwise negative except as noted above.  Physical Exam: Vitals:   04/06/19 1030 04/06/19 1045 04/06/19 1115 04/06/19 1130  BP: 117/87 116/63 111/70 134/71  Pulse: 81 79 81 80  Resp: (!) 24 (!) 21 19 14   Temp:      TempSrc:      SpO2: 98% 97% 97% 98%  Weight:      Height:        GEN- The patient is well appearing, alert and oriented x 3 today.   HEENT: normocephalic, atraumatic; sclera clear, conjunctiva pink; hearing intact; oropharynx clear; neck supple Lungs- Clear to ausculation bilaterally, normal work of breathing.  No wheezes, rales, rhonchi Heart- Regular rate and rhythm, no murmurs, rubs or gallops GI- soft, non-tender, non-distended, bowel sounds present Extremities- no clubbing, cyanosis, or edema; DP/PT/radial pulses 2+ bilaterally MS- no significant deformity or atrophy  Skin- warm and dry, no rash or lesion Psych- euthymic mood, full affect Neuro- strength and sensation are intact  Labs:   Lab Results  Component Value Date   WBC 6.1 04/05/2019   HGB 14.3 04/05/2019  HCT 43.9 04/05/2019   MCV 96.1 04/05/2019   PLT 197 04/05/2019    Recent Labs  Lab 04/05/19 2043  NA 143  K 5.3*  CL 106  CO2 27  BUN 22  CREATININE 1.50*  CALCIUM 9.8  GLUCOSE 121*      Radiology/Studies: Dg Chest 2 View  Result Date: 04/05/2019 CLINICAL DATA:  Chest pain EXAM: CHEST - 2 VIEW COMPARISON:  07/27/2018 FINDINGS: Left AICD remains in place, unchanged. Prior CABG. Heart is borderline enlarged. Lingular scarring. Right lung clear. No effusions or acute bony abnormality. IMPRESSION: No active cardiopulmonary disease. Electronically Signed   By: Charlett Nose M.D.   On: 04/05/2019 20:35    EKG: V paced rhythm at 80 with wide QRS of 170 ms (personally reviewed)  TELEMETRY: NSR 70-80s with occasional V pacing (personally reviewed)  DEVICE HISTORY: MDT ICD implanted 2006 (? gen change)  Assessment/Plan: 1.  VT/VF with appropriate Pt has a chronic history of VT/VF treatments.  Continue Mexitil 250 mg BID. Loghan Kurtzman likely increase chronically.  Continue quinidine 324 mg BID Continue toprol 25 mg daily in conjunction with coreg 12.5 mg BID in setting of VT.  Previously failed amio with thyroid and liver issues. Repeat echo pending.  K 5.3, Mg 2.0 Interrogation print off is NOT available for review. Mindee Robledo interrogate device once COVID test results.   2. CAD s/p CABG and multiple PCIs HS-troponin flat 18 -> 20. He denies ischemic symptoms.   Floyce Bujak discuss plan with Dr. Elberta Fortis. With no ischemic symptoms, can likely increase mexitil and have him follow up as outpatient. Would recommend ischemic work up if VT recurs. Leviathan Macera not be able to drive x 6 months per [place] DMV guidelines after appropriate therapy. (Pending review of his ICD interrogation to confirm appropriate  therapy).  For questions or updates, please contact [facility] Please consult [email or web address] for contact info under Cardiology/STEMI.  Signed, Graciella Freer, PA-C  04/06/2019 12:59 PM      I have seen and examined this patient with Otilio Saber.  Agree with above, note added to reflect my findings.  On exam, RRR, no murmurs, lungs clear. Patient admitted with ICD shocks for VF x2. He currently feels well. Troponin negative. Has a history of ICD shocks. On mexlietine and quinidine currently. Has ischemic cardiomyopathy. Tanice Petre increase mexiletine to 300 mg TID. Aryah Doering have him follow up in clinic with his primary cardiologist at the [facility].    Lavi Sheehan M. Tennile Styles MD 04/06/2019 4:26 PM

## 2019-04-06 NOTE — H&P (Signed)
Cardiology Admission History and Physical:   Patient ID: Andrew [name]; MRN: [id number]; DOB: [birth year]   Admission date: 04/05/2019  Primary Care Provider: Dellia NimsKeplinger, Lynn E, MD [facility]  [facility]   Chief Complaint:    Dizziness  History of Present Illness:   Andrew [name] is a 73 y.o. male with a history of coronary artery disease (s/p CABG 1997 and subsequent PCIs), ischemic cardiomyopathy (EF 25-35%), episodes of ventricular tachycardia s/p ICD in 2006, atrial fibrillation, hypertension, hyperlipidemia, and former smoking who presents to the hospital with complaints of dizziness followed by shocks from his ICD.  He denies any chest pain.  Twice yesterday he developed lightheadedness and dizziness followed by his defibrillator firing.  He has not had any recent weight gain.  There is no orthopnea, paroxysmal nocturnal dyspnea or lower extremity swelling.  He has not had an ICD discharge and many years.  [facility].  In the past he had a history of ventricular tachycardia.  He has had an ICD since 2006.  In the past he was treated with amiodarone that was subsequently discontinued once he developed liver and thyroid side effects.  He currently takes Pradaxa because of his history of atrial fibrillation.  His antiarrhythmic therapy includes mexiletine along with quinidine. He has chronic back issues and is permanently disabled (due to [employer] exposure). He only walks 25-30 yards before getting tired. He takes furosemide every day.  In the ED his ICD was interrogated.  It showed appropriate response from the device at 15:31 and 19:44 at 135 J for episodes of ventricular fibrillation.  Both shocks were successful.  His vital signs were stable with a blood pressure of 131/61 mmHg and a heart rate of 82 bpm.  Labs were as follows: Potassium 5.3, BUN 22, creatinine 1.5, WBC 6.1, hematocrit 43.9 and platelets 197.  The  high-sensitivity troponins were 16 and 18.  The ECG revealed atrial sensed and ventricular paced rhythm at 80 bpm.   Past Medical History:  Diagnosis Date   Atrial fibrillation (HCC)    CAD (coronary artery disease)    a. s/p CABG 1997;  b. s/p PCI to [facility]-RCA 2006;  c. s/p BMS x 3 to S-RCA 10/10;  d.  Myoview 2010: EF 24%, basal inf, mid inf. apical inf, basal inf-lat and mid IL scar, no isch;  e. cath 1/11: S-RCA occluded, S-OM chronically occluded, L-LAD ok with L-R collaterals (med. Tx)   HLD (hyperlipidemia)    HTN (hypertension)    Ischemic cardiomyopathy    a. EF 25-35%;   b. echo 3/11: EF 35%, mild LVH, severe inf/post HK, lat and apical HK, grade 1 diast dysfxn, mild AI, mild to mod MR, PASP 32   Systolic CHF, chronic (HCC)    Ventricular tachycardia (HCC)    a. s/p AICD 2006;   b. Amio intolerant due to liver and thyroid issues;   c. 2/12:  quinidine added to mexilitine  (consider Tikosyn in future??)    Past Surgical History:  Procedure Laterality Date   BLADDER TUMOR EXCISION     CARDIAC CATHETERIZATION  2006,02/2009   CORONARY ARTERY BYPASS GRAFT  1997   Medtronic implantable cardioverter-dibrillator   2006   with sprint fidelis lead     Medications Prior to Admission: Prior to Admission medications   Medication Sig Start Date End Date Taking? Authorizing Provider  acetaminophen (TYLENOL) 325 MG tablet Take 975 mg by mouth 3 (three)  times daily.     [provider]  aspirin EC 81 MG tablet Take 81 mg by mouth at bedtime.    [provider]  carvedilol (COREG) 12.5 MG tablet Take 12.5 mg by mouth 2 (two) times daily with a meal.      [provider]  cefUROXime (CEFTIN) 500 MG tablet Take 1 tablet (500 mg total) by mouth 2 (two) times daily with a meal. 06/18/15   Hower, Aaron Mose, MD  citalopram (CELEXA) 20 MG tablet Take 20 mg by mouth at bedtime.    [provider]  clopidogrel (PLAVIX) 75 MG tablet Take 75 mg by mouth at  bedtime.    [provider]  dabigatran (PRADAXA) 150 MG CAPS capsule Take 150 mg by mouth 2 (two) times daily.    [provider]  finasteride (PROSCAR) 5 MG tablet Take 5 mg by mouth daily.    [provider]  fluconazole (DIFLUCAN) 200 MG tablet Take 200 mg by mouth daily.    [provider]  furosemide (LASIX) 20 MG tablet Take 20 mg by mouth at bedtime.    [provider]  gabapentin (NEURONTIN) 400 MG capsule Take 1,200 mg by mouth 3 (three) times daily.    [provider]  hydrocerin (EUCERIN) CREA Apply 1 application topically 2 (two) times daily as needed (for irritation).    [provider]  hydrocortisone 2.5 % ointment Apply 1 application topically 2 (two) times daily as needed (for itching).    [provider]  isosorbide mononitrate (IMDUR) 30 MG 24 hr tablet Take 30 mg by mouth daily as needed (for increased BP).    [provider]  lisinopril (PRINIVIL,ZESTRIL) 10 MG tablet Take 10 mg by mouth daily.    [provider]  Menthol-Methyl Salicylate (THERA-GESIC EX) Apply 1 application topically 3 (three) times daily as needed (for pain).    [provider]  methocarbamol (ROBAXIN) 750 MG tablet Take 750 mg by mouth 2 (two) times daily as needed for muscle spasms.    [provider]  metoprolol succinate (TOPROL-XL) 25 MG 24 hr tablet Take 25 mg by mouth daily.    [provider]  mexiletine (MEXITIL) 250 MG capsule Take 250 mg by mouth 2 (two) times daily.      [provider]  niacin 500 MG tablet Take 500 mg by mouth at bedtime.    [provider]  nitroGLYCERIN (NITROSTAT) 0.4 MG SL tablet Place 0.4 mg under the tongue every 5 (five) minutes as needed for chest pain.     [provider]  Omega-3 Fatty Acids (FISH OIL) 1000 MG CAPS Take 1,000 mg by mouth at bedtime.    [provider]  omeprazole (PRILOSEC) 20 MG capsule Take 40 mg by  mouth 2 (two) times daily before a meal.    [provider]  quiniDINE gluconate 324 MG CR tablet Take 324 mg by mouth 2 (two) times daily.    [provider]  simvastatin (ZOCOR) 40 MG tablet Take 40 mg by mouth at bedtime.    [provider]  Tamsulosin HCl (FLOMAX) 0.4 MG CAPS Take 0.4 mg by mouth 2 (two) times daily.     [provider]     Allergies:    Allergies  Allergen Reactions   Amiodarone Other (See Comments)    Reaction:  Unknown     Social History:   Social History   Socioeconomic History   Marital  status: Married    Spouse name: Not on file   Number of children: Not on file   Years of education: Not on file   Highest education level: Not on file  Occupational History   Not on file  Social Needs   Financial resource strain: Not on file   Food insecurity    Worry: Not on file    Inability: Not on file   Transportation needs    Medical: Not on file    Non-medical: Not on file  Tobacco Use   Smoking status: Former Smoker    Packs/day: 1.00    Years: 50.00    Pack years: 50.00    Types: Cigarettes    Quit date: 05/05/2002    Years since quitting: 16.9   Smokeless tobacco: Never Used  Substance and Sexual Activity   Alcohol use: Yes    Comment: 3 beers weekly   Drug use: No   Sexual activity: Not on file  Lifestyle   Physical activity    Days per week: Not on file    Minutes per session: Not on file   Stress: Not on file  Relationships   Social connections    Talks on phone: Not on file    Gets together: Not on file    Attends religious service: Not on file    Active member of club or organization: Not on file    Attends meetings of clubs or organizations: Not on file    Relationship status: Not on file   Intimate partner violence    Fear of current or ex partner: Not on file    Emotionally abused: Not on file    Physically abused: Not on file    Forced sexual activity: Not on file  Other  Topics Concern   Not on file  Social History Narrative   Not on file     Family History:   The patient's family history includes Heart attack (age of onset: 32) in his brother; Heart failure in his mother.     Review of Systems: [y] = yes,  = no    General: Weight gain ; Weight loss ; Anorexia ; Fatigue ; Fever ; Chills ; Weakness    Cardiac: Chest pain/pressure ; Resting SOB ; Exertional SOB ; Orthopnea ; Pedal Edema ; Palpitations ; Syncope ; Presyncope ; Paroxysmal nocturnal dyspnea[ ]    Pulmonary: Cough ; Wheezing[ ] ; Hemoptysis[ ] ; Sputum ; Snoring    GI: Vomiting[ ] ; Dysphagia[ ] ; Melena[ ] ; Hematochezia ; Heartburn[ ] ; Abdominal pain ; Constipation ; Diarrhea ; BRBPR    GU: Hematuria[ ] ; Dysuria ; Nocturia[ ]    Vascular: Pain in legs with walking ; Pain in feet with lying flat ; Non-healing sores ; Stroke ; TIA ; Slurred speech ;   Neuro: Headaches[ ] ; Vertigo[ ] ; Seizures[ ] ; Paresthesias[ ] ;Blurred vision ; Diplopia ; Vision changes    Ortho/Skin: Arthritis ; Joint pain ; Muscle pain ; Joint swelling ; Back Pain ; Rash    Psych: Depression[ ] ; Anxiety[ ]    Heme: Bleeding problems ; Clotting disorders ; Anemia    Endocrine: Diabetes ; Thyroid dysfunction[ ]      Physical Exam/Data:   Vitals:   04/05/19 2249 04/05/19  2300 04/05/19 2333 04/05/19 2345  BP:   132/82 133/78  Pulse:  73 71 76  Resp:  16 (!) 21 (!) 23  Temp:      TempSrc:      SpO2: 97% 98% 98% 97%   No intake or output data in the 24 hours ending 04/06/19 0017 There were no vitals filed for this visit. There is no height or weight on file to calculate BMI.  General:  Well nourished, well developed, in no acute distress HEENT: normal Lymph: no adenopathy Neck: no JVD Endocrine:  No thryomegaly Vascular: No carotid bruits; FA pulses 2+ bilaterally without bruits    Cardiac:  normal S1, S2; RRR; no murmur  Lungs:  clear to auscultation bilaterally, no wheezing, rhonchi or rales  Abd: soft, nontender, no hepatomegaly  Ext: no edema Musculoskeletal:  No deformities, BUE and BLE strength normal and equal Skin: warm and dry  Neuro:  CNs 2-12 intact, no focal abnormalities noted Psych:  Normal affect     Laboratory Data:  Chemistry Recent Labs  Lab 04/05/19 2043  NA 143  K 5.3*  CL 106  CO2 27  GLUCOSE 121*  BUN 22  CREATININE 1.50*  CALCIUM 9.8  GFRNONAA 46*  GFRAA 53*  ANIONGAP 10    No results for input(s): PROT, ALBUMIN, AST, ALT, ALKPHOS, BILITOT in the last 168 hours. Hematology Recent Labs  Lab 04/05/19 2043  WBC 6.1  RBC 4.57  HGB 14.3  HCT 43.9  MCV 96.1  MCH 31.3  MCHC 32.6  RDW 15.9*  PLT 197   Cardiac EnzymesNo results for input(s): TROPONINI in the last 168 hours. No results for input(s): TROPIPOC in the last 168 hours.  BNPNo results for input(s): BNP, PROBNP in the last 168 hours.  DDimer No results for input(s): DDIMER in the last 168 hours.  Radiology/Studies:  Dg Chest 2 View  Result Date: 04/05/2019 CLINICAL DATA:  Chest pain EXAM: CHEST - 2 VIEW COMPARISON:  07/27/2018 FINDINGS: Left AICD remains in place, unchanged. Prior CABG. Heart is borderline enlarged. Lingular scarring. Right lung clear. No effusions or acute bony abnormality. IMPRESSION: No active cardiopulmonary disease. Electronically Signed   By: Charlett Nose M.D.   On: 04/05/2019 20:35    Assessment and Plan:   1.  Episodes of ventricular fibrillation treated with appropriate ICD shocks ( x 2) By the patient's description he had dizziness earlier yesterday followed by 2 ICD shocks.  He denied any chest pain or shortness of breath.  He was not engaged in physical activity at the time.  Device interrogation reveals appropriate ICD discharges for VF events.  The initial high-sensitivity troponins are negative x2.  The patient receives his cardiac  care at the [place].  -Admit to telemetry -Follow cardiac biomarkers and serial ECGs -Maintain serum potassium >4.0 and magnesium >2.0 -Daily weights -Strict I & Os -Transthoracic echocardiogram to evaluate LV function -Keep patient n.p.o. -Electrophysiology consult in the morning -Continue oral antiarrhythmics   Severity of Illness: The appropriate patient status for this patient is INPATIENT. Inpatient status is judged to be reasonable and necessary in order to provide the required intensity of service to ensure the patient's safety. The patient's presenting symptoms, physical exam findings, and initial radiographic and laboratory data in the context of their chronic comorbidities is felt to place them at high risk for further clinical deterioration. Furthermore, it is not anticipated that the patient will be medically stable for discharge from the hospital within 2 midnights of  admission. The following factors support the patient status of inpatient.   " The patient's presenting symptoms include dizziness. " The physical exam findings include normal cardiac auscultation. " The initial radiographic and laboratory data are worrisome because of ICD interrogation revealing episodes of VF treated with appropriate shocks. " The chronic co-morbidities include ischemic cardiomyopathy.   * I certify that at the point of admission it is my clinical judgment that the patient will require inpatient hospital care spanning beyond 2 midnights from the point of admission due to high intensity of service, high risk for further deterioration and high frequency of surveillance required.*    For questions or updates, please contact [facility] Please consult [email or web address] for contact info under Cardiology/STEMI.    Signed, Lonie Peak, MD  04/06/2019 12:17 AM

## 2019-04-06 NOTE — ED Notes (Signed)
Ordered diet tray 

## 2019-04-06 NOTE — Discharge Instructions (Signed)
By [place] DMV law, no driving for 6 months after an appropriate therapy from an ICD.

## 2019-04-06 NOTE — ED Notes (Signed)
Medtronic report given to Dr. Roxanne Mins.

## 2019-04-06 NOTE — Progress Notes (Signed)
Echocardiogram 2D Echocardiogram has been performed.  Oneal Deputy [facility] 04/06/2019, 3:24 PM

## 2019-04-06 NOTE — Discharge Summary (Addendum)
ELECTROPHYSIOLOGY PROCEDURE DISCHARGE SUMMARY    Patient ID: Andrew [name],  MRN: [id number], DOB/AGE: Nov 20, 1945 73 y.o.  Admit date: 04/05/2019 Discharge date: 04/06/2019  Primary Care Physician: Ronnie Doss, MD  Primary Cardiology/EP: [facility] (Dr. Nancy Fetter)   Primary Discharge Diagnosis:  Principal Problem:   VENTRICULAR TACHYCARDIA Active Problems:   Ventricular tachycardia [facility])  Appropriate ICD shock  Secondary Discharge Diagnosis CAD s/p CABG Chronic systolic CHF  Allergies  Allergen Reactions  . Amiodarone Other (See Comments)    Reaction:  Unknown    Brief HPI:  Andrew [name] is a 73 y.o. male with a history of CAD s/p CABG 1997 and subsequent PCIs, ICM (EF reported 25-30%, repeat pending), h/o VT on AAD, h/o AF, HTN, HLD, and tobacco abuse who was admitted for ICD shock x 2.   Hospital Course:  Andrew [name] is a 73 y.o. male with medical history as above who was admitted on 04/05/2019 for ICD shocks x 2. He received shocks 12/1 @ 1531 and 1944 for episodes of VF. Both shocks were successful.  Pt states he was in his USOH prior to his shocks.  He denies any recent viral illness, ischemic symptoms, or non-compliance with his medications.  He has previously tolerated amiodarone poorly due to liver and thyroid dysfunction.   He is in close contact with his [facility] cardiologist, who is aware of his admission and [place] Grindstaff plan for close follow up.   Dr. Curt Bears examined the patient and plan Geraldyn Shain be to increase mexiletine to 300 mg TID with close follow up with the [facility].  Echo this admission showed EF 25-30%, which is stable compared to his most recently available EF.    The patient was examined and determined stable for discharge with [facility].  He was made aware of [place] guidelines of no driving x 6 months after an appropriate ICD therapy.   Physical Exam: Vitals:   04/06/19 1130 04/06/19 1400 04/06/19 1430 04/06/19 1500  BP: 134/71 137/80 123/79  132/80  Pulse: 80 69 80 71  Resp: 14 16 16 18   Temp:      TempSrc:      SpO2: 98% 94% 95% 97%  Weight:      Height:        GEN- The patient is well appearing, alert and oriented x 3 today.   HEENT: normocephalic, atraumatic; sclera clear, conjunctiva pink; hearing intact; oropharynx clear; neck supple, no JVP Lymph- no cervical lymphadenopathy Lungs- Clear to ausculation bilaterally, normal work of breathing.  No wheezes, rales, rhonchi Heart- Regular rate and rhythm, no murmurs, rubs or gallops, PMI not laterally displaced GI- soft, non-tender, non-distended, bowel sounds present, no hepatosplenomegaly Extremities- no clubbing, cyanosis, or edema; DP/PT/radial pulses 2+ bilaterally MS- no significant deformity or atrophy Skin- warm and dry, no rash or lesion Psych- euthymic mood, full affect Neuro- strength and sensation are intact    Labs:   Lab Results  Component Value Date   WBC 6.1 04/05/2019   HGB 14.3 04/05/2019   HCT 43.9 04/05/2019   MCV 96.1 04/05/2019   PLT 197 04/05/2019    Recent Labs  Lab 04/05/19 2043  NA 143  K 5.3*  CL 106  CO2 27  BUN 22  CREATININE 1.50*  CALCIUM 9.8  GLUCOSE 121*     Discharge Medications:  Allergies as of 04/06/2019      Reactions   Amiodarone Other (See Comments)   Reaction:  Unknown       Medication List    STOP taking these medications   cefUROXime 500 MG tablet Commonly known as: Ceftin     TAKE these medications   acetaminophen 325 MG tablet Commonly known as: TYLENOL Take 975 mg by mouth every 4 (four) hours as needed for mild pain.   aspirin EC 81 MG tablet Take 81 mg by mouth at bedtime.   carvedilol 12.5 MG tablet Commonly known as: COREG Take 12.5 mg by mouth 2 (two) times daily with a meal.   citalopram 20 MG tablet Commonly known as: CELEXA Take 20 mg by mouth at bedtime.   clopidogrel 75 MG tablet Commonly known as: PLAVIX Take 75 mg by mouth at bedtime.   dabigatran 150 MG Caps capsule  Commonly known as: PRADAXA Take 150 mg by mouth 2 (two) times daily.   finasteride 5 MG tablet Commonly known as: PROSCAR Take 5 mg by mouth daily.   Fish Oil 1000 MG Caps Take 1,000 mg by mouth at bedtime.   fluconazole 200 MG tablet Commonly known as: DIFLUCAN Take 200 mg by mouth daily.   furosemide 20 MG tablet Commonly known as: LASIX Take 20 mg by mouth at bedtime.   gabapentin 400 MG capsule Commonly known as: NEURONTIN Take 1,200 mg by mouth 3 (three) times daily.   hydrocerin Crea Apply 1 application topically 2 (two) times daily as needed (for irritation).   hydrocortisone 2.5 % ointment Apply 1 application topically 2 (two) times daily as needed (for itching).   isosorbide mononitrate 30 MG 24 hr tablet Commonly known as: IMDUR Take 30 mg by mouth daily as needed (for increased BP).   lisinopril 10 MG tablet Commonly known as: ZESTRIL Take 10 mg by mouth daily.   methocarbamol 750 MG tablet Commonly known as: ROBAXIN Take 750 mg by mouth 2 (two) times daily as needed for muscle spasms.   metoprolol succinate 25 MG 24 hr tablet Commonly known as: TOPROL-XL Take 25 mg by mouth daily.   mexiletine 150 MG capsule Commonly known as: MEXITIL Take 2 capsules (300 mg total) by mouth 3 (three) times daily. What changed:   medication strength  how much to take  when to take this   niacin 500 MG tablet Take 500 mg by mouth at bedtime.   nitroGLYCERIN 0.4 MG SL tablet Commonly known as: NITROSTAT Place 0.4 mg under the tongue every 5 (five) minutes as needed for chest pain.   omeprazole 20 MG capsule Commonly known as: PRILOSEC Take 40 mg by mouth 2 (two) times daily before a meal.   quiniDINE gluconate 324 MG CR tablet Take 324 mg by mouth 2 (two) times daily.   simvastatin 40 MG tablet Commonly known as: ZOCOR Take 40 mg by mouth at bedtime.   tamsulosin 0.4 MG Caps capsule Commonly known as: FLOMAX Take 0.4 mg by mouth 2 (two) times daily.    THERA-GESIC EX Apply 1 application topically 3 (three) times daily as needed (for pain).       Disposition: Pt is being discharged home today in stable condition.  Follow-up Information    [facility]   Why: Please call to make an appointment as soon as possible for follow up after your hospitilzation for ICD therapy.           Duration of Discharge Encounter: Greater than 30 minutes including physician time.  Dustin FlockSigned, Michael Andrew Tillery, PA-C  04/06/2019 5:13 PM  I have  seen and examined this patient with Otilio Saber.  Agree with above, note added to reflect my findings.  On exam, RRR, no murmurs, lungs clear.  He presented to the hospital with ICD shocks for ventricular fibrillation.  He is currently on both quinidine and mexiletine.  He cannot tolerate amiodarone due to liver and thyroid issues.  His troponins have been negative.  There is no obvious reversible cause for his ventricular arrhythmias.  [facility]  As he has remained quiet since he has been in the hospital, we Mohamadou Maciver increase mexiletine to 300 mg 3 times a day follow-up with his primary cardiologist.  Bailyn Spackman M. Mercer Stallworth MD 04/07/2019 8:57 AM

## 2019-04-06 NOTE — ED Notes (Signed)
Pt ambulated to the bathroom with a steady gait.

## 2019-04-22 ENCOUNTER — Inpatient Hospital Stay (HOSPITAL_BASED_OUTPATIENT_CLINIC_OR_DEPARTMENT_OTHER)
Admit: 2019-04-22 | Discharge: 2019-04-22 | Disposition: A | Discharge status: Home / Self Care | Payer: No Typology Code available for payment source | Attending: Family Medicine | Admitting: Family Medicine

## 2019-04-22 ENCOUNTER — Emergency Department: Payer: No Typology Code available for payment source

## 2019-04-22 ENCOUNTER — Other Ambulatory Visit: Payer: Self-pay

## 2019-04-22 ENCOUNTER — Observation Stay
Admission: EM | Admit: 2019-04-22 | Discharge: 2019-04-23 | Disposition: A | Discharge status: Home / Self Care | Payer: No Typology Code available for payment source | Attending: Internal Medicine | Admitting: Internal Medicine

## 2019-04-22 DIAGNOSIS — Z7902 Long term (current) use of antithrombotics/antiplatelets: Secondary | ICD-10-CM | POA: Insufficient documentation

## 2019-04-22 DIAGNOSIS — N183 Chronic kidney disease, stage 3 unspecified: Secondary | ICD-10-CM | POA: Diagnosis not present

## 2019-04-22 DIAGNOSIS — G629 Polyneuropathy, unspecified: Secondary | ICD-10-CM | POA: Insufficient documentation

## 2019-04-22 DIAGNOSIS — I498 Other specified cardiac arrhythmias: Secondary | ICD-10-CM

## 2019-04-22 DIAGNOSIS — Z95810 Presence of automatic (implantable) cardiac defibrillator: Secondary | ICD-10-CM | POA: Insufficient documentation

## 2019-04-22 DIAGNOSIS — Z951 Presence of aortocoronary bypass graft: Secondary | ICD-10-CM

## 2019-04-22 DIAGNOSIS — Z7901 Long term (current) use of anticoagulants: Secondary | ICD-10-CM | POA: Diagnosis not present

## 2019-04-22 DIAGNOSIS — I700 Atherosclerosis of aorta: Secondary | ICD-10-CM | POA: Diagnosis not present

## 2019-04-22 DIAGNOSIS — I255 Ischemic cardiomyopathy: Secondary | ICD-10-CM | POA: Insufficient documentation

## 2019-04-22 DIAGNOSIS — E039 Hypothyroidism, unspecified: Secondary | ICD-10-CM | POA: Diagnosis not present

## 2019-04-22 DIAGNOSIS — N400 Enlarged prostate without lower urinary tract symptoms: Secondary | ICD-10-CM | POA: Diagnosis not present

## 2019-04-22 DIAGNOSIS — I200 Unstable angina: Secondary | ICD-10-CM

## 2019-04-22 DIAGNOSIS — I214 Non-ST elevation (NSTEMI) myocardial infarction: Principal | ICD-10-CM | POA: Diagnosis present

## 2019-04-22 DIAGNOSIS — Z79890 Hormone replacement therapy (postmenopausal): Secondary | ICD-10-CM | POA: Diagnosis not present

## 2019-04-22 DIAGNOSIS — I2510 Atherosclerotic heart disease of native coronary artery without angina pectoris: Secondary | ICD-10-CM | POA: Diagnosis not present

## 2019-04-22 DIAGNOSIS — Z9889 Other specified postprocedural states: Secondary | ICD-10-CM | POA: Insufficient documentation

## 2019-04-22 DIAGNOSIS — E785 Hyperlipidemia, unspecified: Secondary | ICD-10-CM | POA: Diagnosis not present

## 2019-04-22 DIAGNOSIS — I10 Essential (primary) hypertension: Secondary | ICD-10-CM

## 2019-04-22 DIAGNOSIS — I5022 Chronic systolic (congestive) heart failure: Secondary | ICD-10-CM | POA: Diagnosis not present

## 2019-04-22 DIAGNOSIS — Z79899 Other long term (current) drug therapy: Secondary | ICD-10-CM | POA: Diagnosis not present

## 2019-04-22 DIAGNOSIS — I480 Paroxysmal atrial fibrillation: Secondary | ICD-10-CM | POA: Diagnosis not present

## 2019-04-22 DIAGNOSIS — E7800 Pure hypercholesterolemia, unspecified: Secondary | ICD-10-CM | POA: Insufficient documentation

## 2019-04-22 DIAGNOSIS — Z20828 Contact with and (suspected) exposure to other viral communicable diseases: Secondary | ICD-10-CM | POA: Diagnosis not present

## 2019-04-22 DIAGNOSIS — Z87891 Personal history of nicotine dependence: Secondary | ICD-10-CM | POA: Insufficient documentation

## 2019-04-22 DIAGNOSIS — I130 Hypertensive heart and chronic kidney disease with heart failure and stage 1 through stage 4 chronic kidney disease, or unspecified chronic kidney disease: Secondary | ICD-10-CM | POA: Insufficient documentation

## 2019-04-22 DIAGNOSIS — R079 Chest pain, unspecified: Secondary | ICD-10-CM

## 2019-04-22 DIAGNOSIS — M546 Pain in thoracic spine: Secondary | ICD-10-CM

## 2019-04-22 DIAGNOSIS — I248 Other forms of acute ischemic heart disease: Secondary | ICD-10-CM

## 2019-04-22 DIAGNOSIS — I340 Nonrheumatic mitral (valve) insufficiency: Secondary | ICD-10-CM | POA: Diagnosis not present

## 2019-04-22 DIAGNOSIS — R0789 Other chest pain: Secondary | ICD-10-CM

## 2019-04-22 DIAGNOSIS — K4090 Unilateral inguinal hernia, without obstruction or gangrene, not specified as recurrent: Secondary | ICD-10-CM | POA: Insufficient documentation

## 2019-04-22 DIAGNOSIS — I25118 Atherosclerotic heart disease of native coronary artery with other forms of angina pectoris: Secondary | ICD-10-CM

## 2019-04-22 DIAGNOSIS — R778 Other specified abnormalities of plasma proteins: Secondary | ICD-10-CM

## 2019-04-22 LAB — PROTIME-INR
INR: 1.5 — ABNORMAL HIGH (ref 0.8–1.2)
Prothrombin Time: 17.9 seconds — ABNORMAL HIGH (ref 11.4–15.2)

## 2019-04-22 LAB — COMPREHENSIVE METABOLIC PANEL
ALT: 27 U/L (ref 0–44)
AST: 31 U/L (ref 15–41)
Albumin: 3.7 g/dL (ref 3.5–5.0)
Alkaline Phosphatase: 88 U/L (ref 38–126)
Anion gap: 10 (ref 5–15)
BUN: 29 mg/dL — ABNORMAL HIGH (ref 8–23)
CO2: 22 mmol/L (ref 22–32)
Calcium: 8.9 mg/dL (ref 8.9–10.3)
Chloride: 105 mmol/L (ref 98–111)
Creatinine, Ser: 1.51 mg/dL — ABNORMAL HIGH (ref 0.61–1.24)
GFR calc Af Amer: 52 mL/min — ABNORMAL LOW (ref >60)
GFR calc non Af Amer: 45 mL/min — ABNORMAL LOW (ref >60)
Glucose, Bld: 118 mg/dL — ABNORMAL HIGH (ref 70–99)
Potassium: 4.3 mmol/L (ref 3.5–5.1)
Sodium: 137 mmol/L (ref 135–145)
Total Bilirubin: 0.9 mg/dL (ref 0.3–1.2)
Total Protein: 6.9 g/dL (ref 6.5–8.1)

## 2019-04-22 LAB — ECHOCARDIOGRAM COMPLETE
Height: 67 in
Weight: 3056 oz

## 2019-04-22 LAB — TROPONIN I (HIGH SENSITIVITY)
Troponin I (High Sensitivity): 200 ng/L (ref <18)
Troponin I (High Sensitivity): 190 ng/L (ref <18)
Troponin I (High Sensitivity): 131 ng/L (ref <18)

## 2019-04-22 LAB — LIPID PANEL
Cholesterol: 92 mg/dL (ref 0–200)
HDL: 28 mg/dL — ABNORMAL LOW (ref >40)
LDL Cholesterol: 47 mg/dL (ref 0–99)
Total CHOL/HDL Ratio: 3.3 RATIO
Triglycerides: 83 mg/dL (ref <150)
VLDL: 17 mg/dL (ref 0–40)

## 2019-04-22 LAB — CBC WITH DIFFERENTIAL/PLATELET
Abs Immature Granulocytes: 0.02 10*3/uL (ref 0.00–0.07)
Basophils Absolute: 0.1 10*3/uL (ref 0.0–0.1)
Basophils Relative: 1 %
Eosinophils Absolute: 0.2 10*3/uL (ref 0.0–0.5)
Eosinophils Relative: 3 %
HCT: 37.3 % — ABNORMAL LOW (ref 39.0–52.0)
Hemoglobin: 12.3 g/dL — ABNORMAL LOW (ref 13.0–17.0)
Immature Granulocytes: 0 %
Lymphocytes Relative: 30 %
Lymphs Abs: 1.8 10*3/uL (ref 0.7–4.0)
MCH: 30.7 pg (ref 26.0–34.0)
MCHC: 33 g/dL (ref 30.0–36.0)
MCV: 93 fL (ref 80.0–100.0)
Monocytes Absolute: 0.8 10*3/uL (ref 0.1–1.0)
Monocytes Relative: 13 %
Neutro Abs: 3.2 10*3/uL (ref 1.7–7.7)
Neutrophils Relative %: 53 %
Platelets: 174 10*3/uL (ref 150–400)
RBC: 4.01 MIL/uL — ABNORMAL LOW (ref 4.22–5.81)
RDW: 15.8 % — ABNORMAL HIGH (ref 11.5–15.5)
WBC: 6 10*3/uL (ref 4.0–10.5)
nRBC: 0 % (ref 0.0–0.2)

## 2019-04-22 LAB — HEPARIN LEVEL (UNFRACTIONATED)
Heparin Unfractionated: <0.1 IU/mL — ABNORMAL LOW (ref 0.30–0.70)
Heparin Unfractionated: 0.35 IU/mL (ref 0.30–0.70)
Heparin Unfractionated: 0.39 IU/mL (ref 0.30–0.70)

## 2019-04-22 LAB — SARS CORONAVIRUS 2 (TAT 6-24 HRS): SARS Coronavirus 2: NEGATIVE

## 2019-04-22 LAB — LIPASE, BLOOD: Lipase: 23 U/L (ref 11–51)

## 2019-04-22 LAB — APTT: aPTT: 66 seconds — ABNORMAL HIGH (ref 24–36)

## 2019-04-22 MED ORDER — GABAPENTIN 300 MG PO CAPS
600.0000 mg | ORAL_CAPSULE | Freq: Three times a day (TID) | ORAL | Status: DC
  Administered 2019-04-22 – 2019-04-23 (×3): 600 mg via ORAL
  Filled 2019-04-22 (×4): qty 2

## 2019-04-22 MED ORDER — ZOLPIDEM TARTRATE 5 MG PO TABS: 5.0000 mg | ORAL_TABLET | Freq: Every evening | ORAL | Status: DC | PRN

## 2019-04-22 MED ORDER — NITROGLYCERIN 0.4 MG SL SUBL: 0.4000 mg | SUBLINGUAL_TABLET | SUBLINGUAL | Status: DC | PRN

## 2019-04-22 MED ORDER — SODIUM CHLORIDE 0.9 % IV SOLN: INTRAVENOUS | Status: DC

## 2019-04-22 MED ORDER — OXYBUTYNIN CHLORIDE ER 10 MG PO TB24
10.0000 mg | ORAL_TABLET | Freq: Every day | ORAL | Status: DC
  Administered 2019-04-23: 10 mg via ORAL
  Filled 2019-04-22 (×3): qty 1

## 2019-04-22 MED ORDER — ACETAMINOPHEN 325 MG PO TABS: 650.0000 mg | ORAL_TABLET | ORAL | Status: DC | PRN

## 2019-04-22 MED ORDER — ZOLPIDEM TARTRATE 5 MG PO TABS
5.0000 mg | ORAL_TABLET | Freq: Every evening | ORAL | Status: DC | PRN
  Filled 2019-04-22: qty 1

## 2019-04-22 MED ORDER — HEPARIN BOLUS VIA INFUSION
4000.0000 [IU] | Freq: Once | INTRAVENOUS | Status: DC
  Filled 2019-04-22: qty 4000

## 2019-04-22 MED ORDER — MEXILETINE HCL 150 MG PO CAPS
300.0000 mg | ORAL_CAPSULE | Freq: Three times a day (TID) | ORAL | Status: DC
  Administered 2019-04-22 – 2019-04-23 (×4): 300 mg via ORAL
  Filled 2019-04-22 (×8): qty 2

## 2019-04-22 MED ORDER — CLOPIDOGREL BISULFATE 75 MG PO TABS: 75.0000 mg | ORAL_TABLET | Freq: Every day | ORAL | Status: DC

## 2019-04-22 MED ORDER — IOHEXOL 350 MG/ML SOLN
100.0000 mL | Freq: Once | INTRAVENOUS | Status: AC | PRN
  Administered 2019-04-22: 100 mL via INTRAVENOUS

## 2019-04-22 MED ORDER — METOPROLOL SUCCINATE ER 25 MG PO TB24
25.0000 mg | ORAL_TABLET | Freq: Every day | ORAL | Status: DC
  Administered 2019-04-22 – 2019-04-23 (×2): 25 mg via ORAL
  Filled 2019-04-22 (×2): qty 1

## 2019-04-22 MED ORDER — ONDANSETRON HCL 4 MG/2ML IJ SOLN: 4.0000 mg | Freq: Four times a day (QID) | INTRAMUSCULAR | Status: DC | PRN

## 2019-04-22 MED ORDER — CLOPIDOGREL BISULFATE 75 MG PO TABS
75.0000 mg | ORAL_TABLET | Freq: Every day | ORAL | Status: DC
  Administered 2019-04-22 – 2019-04-23 (×2): 75 mg via ORAL
  Filled 2019-04-22 (×2): qty 1

## 2019-04-22 MED ORDER — HEPARIN (PORCINE) 25000 UT/250ML-% IV SOLN
INTRAVENOUS | Status: AC
  Administered 2019-04-22: 04:00:00 2000 [IU] via INTRAVENOUS
  Filled 2019-04-22: qty 250

## 2019-04-22 MED ORDER — HEPARIN SODIUM (PORCINE) 5000 UNIT/ML IJ SOLN: 4000.0000 [IU] | Freq: Once | INTRAMUSCULAR | Status: DC

## 2019-04-22 MED ORDER — FAMOTIDINE 20 MG PO TABS
40.0000 mg | ORAL_TABLET | Freq: Every day | ORAL | Status: DC
  Administered 2019-04-22 – 2019-04-23 (×2): 40 mg via ORAL
  Filled 2019-04-22 (×2): qty 2

## 2019-04-22 MED ORDER — ATORVASTATIN CALCIUM 80 MG PO TABS: 80.0000 mg | ORAL_TABLET | Freq: Every day | ORAL | Status: DC

## 2019-04-22 MED ORDER — LEVOTHYROXINE SODIUM 25 MCG PO TABS
25.0000 ug | ORAL_TABLET | ORAL | Status: DC
  Administered 2019-04-23: 25 ug via ORAL
  Filled 2019-04-22: qty 1

## 2019-04-22 MED ORDER — ATORVASTATIN CALCIUM 80 MG PO TABS
80.0000 mg | ORAL_TABLET | Freq: Every day | ORAL | Status: DC
  Administered 2019-04-22: 18:00:00 80 mg via ORAL
  Filled 2019-04-22 (×2): qty 1

## 2019-04-22 MED ORDER — DABIGATRAN ETEXILATE MESYLATE 150 MG PO CAPS: 150.0000 mg | ORAL_CAPSULE | Freq: Two times a day (BID) | ORAL | Status: DC

## 2019-04-22 MED ORDER — ASPIRIN 81 MG PO CHEW
324.0000 mg | CHEWABLE_TABLET | ORAL | Status: AC
  Administered 2019-04-22: 324 mg via ORAL
  Filled 2019-04-22: qty 4

## 2019-04-22 MED ORDER — PERFLUTREN LIPID MICROSPHERE
1.0000 mL | INTRAVENOUS | Status: AC | PRN
  Administered 2019-04-22: 2 mL via INTRAVENOUS
  Filled 2019-04-22: qty 10

## 2019-04-22 MED ORDER — ASPIRIN 81 MG PO TBEC: 81.0000 mg | DELAYED_RELEASE_TABLET | Freq: Every day | ORAL | Status: DC

## 2019-04-22 MED ORDER — HEPARIN (PORCINE) 25000 UT/250ML-% IV SOLN
1000.0000 [IU]/h | INTRAVENOUS | Status: DC
  Administered 2019-04-22: 1000 [IU]/h via INTRAVENOUS
  Filled 2019-04-22 (×2): qty 250

## 2019-04-22 MED ORDER — ONDANSETRON HCL 4 MG/2ML IJ SOLN
4.0000 mg | Freq: Once | INTRAMUSCULAR | Status: AC
  Administered 2019-04-22: 4 mg via INTRAVENOUS
  Filled 2019-04-22: qty 2

## 2019-04-22 MED ORDER — ASPIRIN EC 81 MG PO TBEC
81.0000 mg | DELAYED_RELEASE_TABLET | Freq: Every day | ORAL | Status: DC
  Administered 2019-04-23: 81 mg via ORAL
  Filled 2019-04-22: qty 1

## 2019-04-22 MED ORDER — HEPARIN (PORCINE) 25000 UT/250ML-% IV SOLN: 14.0000 [IU]/kg/h | INTRAVENOUS | Status: DC

## 2019-04-22 MED ORDER — METOPROLOL TARTRATE 25 MG PO TABS
12.5000 mg | ORAL_TABLET | Freq: Two times a day (BID) | ORAL | Status: DC
  Filled 2019-04-22: qty 1

## 2019-04-22 MED ORDER — FINASTERIDE 5 MG PO TABS
5.0000 mg | ORAL_TABLET | Freq: Every day | ORAL | Status: DC
  Administered 2019-04-22 – 2019-04-23 (×2): 5 mg via ORAL
  Filled 2019-04-22 (×3): qty 1

## 2019-04-22 MED ORDER — METHOCARBAMOL 750 MG PO TABS
750.0000 mg | ORAL_TABLET | Freq: Two times a day (BID) | ORAL | Status: DC | PRN
  Filled 2019-04-22: qty 1

## 2019-04-22 MED ORDER — NITROGLYCERIN 2 % TD OINT
1.0000 [in_us] | TOPICAL_OINTMENT | Freq: Four times a day (QID) | TRANSDERMAL | Status: DC
  Filled 2019-04-22: qty 1

## 2019-04-22 MED ORDER — MORPHINE SULFATE (PF) 2 MG/ML IV SOLN
2.0000 mg | Freq: Once | INTRAVENOUS | Status: AC
  Administered 2019-04-22: 2 mg via INTRAVENOUS
  Filled 2019-04-22: qty 1

## 2019-04-22 MED ORDER — LEVOTHYROXINE SODIUM 50 MCG PO TABS: 50.0000 ug | ORAL_TABLET | ORAL | Status: DC

## 2019-04-22 MED ORDER — TAMSULOSIN HCL 0.4 MG PO CAPS
0.4000 mg | ORAL_CAPSULE | Freq: Every day | ORAL | Status: DC
  Administered 2019-04-22 – 2019-04-23 (×2): 0.4 mg via ORAL
  Filled 2019-04-22 (×3): qty 1

## 2019-04-22 MED ORDER — SPIRONOLACTONE 25 MG PO TABS
12.5000 mg | ORAL_TABLET | Freq: Every day | ORAL | Status: DC
  Administered 2019-04-23: 12.5 mg via ORAL
  Filled 2019-04-22: qty 0.5
  Filled 2019-04-22: qty 1

## 2019-04-22 MED ORDER — FUROSEMIDE 40 MG PO TABS
20.0000 mg | ORAL_TABLET | Freq: Every day | ORAL | Status: DC
  Administered 2019-04-22: 20 mg via ORAL
  Filled 2019-04-22: qty 1

## 2019-04-22 MED ORDER — LISINOPRIL 5 MG PO TABS
2.5000 mg | ORAL_TABLET | Freq: Every day | ORAL | Status: DC
  Administered 2019-04-22 – 2019-04-23 (×2): 2.5 mg via ORAL
  Filled 2019-04-22 (×2): qty 1

## 2019-04-22 MED ORDER — HEPARIN (PORCINE) 25000 UT/250ML-% IV SOLN: 1000.0000 [IU]/h | INTRAVENOUS | Status: DC

## 2019-04-22 MED ORDER — ASPIRIN 300 MG RE SUPP: 300.0000 mg | RECTAL | Status: AC

## 2019-04-22 MED ORDER — HEPARIN BOLUS VIA INFUSION
2000.0000 [IU] | Freq: Once | INTRAVENOUS | Status: AC
  Filled 2019-04-22: qty 2000

## 2019-04-22 MED ORDER — MORPHINE SULFATE (PF) 2 MG/ML IV SOLN: 2.0000 mg | INTRAVENOUS | Status: DC | PRN

## 2019-04-22 NOTE — Consult Note (Signed)
ANTICOAGULATION CONSULT NOTE - Follow Up Consult  Pharmacy Consult for Heparin Infusion  Indication: chest pain/ACS  Allergies  Allergen Reactions  . Amiodarone Other (See Comments)    Reaction:  Unknown     Patient Measurements: Height: 5\' 7"  (170.2 cm) Weight: 191 lb (86.6 kg) IBW/kg (Calculated) : 66.1  Vital Signs: Temp: 97.9 F (36.6 C) (12/18 0134) Temp Source: Oral (12/18 0134) BP: 113/76 (12/18 0134) Pulse Rate: 70 (12/18 0134)  Labs: Recent Labs    04/22/19 0137  HGB 12.3*  HCT 37.3*  PLT 174  CREATININE 1.51*  TROPONINIHS 200*    Estimated Creatinine Clearance: 45.8 mL/min (A) (by C-G formula based on SCr of 1.51 mg/dL (H)).  Assessment: Pharmacy consulted for heparin infusion dosing and  Monitoring in 73 yo male admitted with chest pain. Patient does have PMH of A. Fib and takes Pradaxa PTA. Last reported dose according to patient was 12/17 @ 2300.   Goal of Therapy:  Heparin level 0.3-0.7 units/ml Monitor platelets by anticoagulation protocol: Yes   Plan:  Baseline labs ordered After discussion with admitting provider about timing of patient's last Dabigatran dose, heparin bolus will be reduced by half.  Give heparin 2000 unit bolus x1. Start heparin infusion at 1000 units/hr Check anti-Xa level in 8 hours and daily while on heparin Continue to monitor H&H and platelets  Pernell Dupre, PharmD, BCPS Clinical Pharmacist 04/22/2019 4:00 AM

## 2019-04-22 NOTE — Progress Notes (Addendum)
PROGRESS NOTE    Andrew ChampagneRonald E Lazarz  ZOX:[id number] DOB: 15-Feb-1946 DOA: 04/22/2019 PCP: Dellia NimsKeplinger, Lynn E, MD      Assessment & Plan:   Active Problems:   Non-STEMI (non-ST elevated myocardial infarction) (HCC)   Possible STEMI: ST elevations & T wave inversions noted in EKG. Troponins are elevated. Continue on tele. Hx of CAD, AICD, & ischemic cardiomyopathy. Continue on IV heparin drip. Morphine & nitro prn for pain. NPO. Cardio consulted.   A. Fib: likely PAF. Continue on home dose of metoprolol. Hold home dose of pradaxa. Continue on IV heparin drip. Continue on tele.  Hx of AICD[facility]. Continue on tele   HLD: continue on statin  Peripheral neuropathy: will continue on home dose of gabapentin  Hypothyroidism: will continue on home dose of levothyroxine  BPH: continue on home dose of tamsulosin  Possible GERD: will start famotidine    DVT prophylaxis: IV heparin drip Code Status: full  Family Communication:  Disposition Plan:   Consultants:   cardio   Procedures:    Antimicrobials: n/a   Subjective: Pt c/o chest pain.  Objective: Vitals:   04/22/19 0430 04/22/19 0500 04/22/19 0530 04/22/19 0600  BP: 109/68 105/70 106/68 108/75  Pulse: 70 69 69 70  Resp: 15 14 14 14   Temp:    97.8 F (36.6 C)  TempSrc:    Oral  SpO2: 98% 98% 98% 97%  Weight:      Height:       No intake or output data in the 24 hours ending 04/22/19 0859 Filed Weights   04/22/19 0135  Weight: 86.6 kg    Examination:  General exam: Appears  Uncomfortable. Acute distress secondary to chest pain Respiratory system: diminished breath sounds b/l otherwise clear Cardiovascular system: S1 & S2 +. No rubs, gallops or clicks.  Gastrointestinal system: Abdomen is nondistended, soft and nontender. Normal bowel sounds heard. Central nervous system: Alert and oriented. Moves all 4 extremities Psychiatry: Judgement and insight appear normal. Flat mood  and affect .     Data Reviewed: I have personally reviewed following labs and imaging studies  CBC: Recent Labs  Lab 04/22/19 0137  WBC 6.0  NEUTROABS 3.2  HGB 12.3*  HCT 37.3*  MCV 93.0  PLT 174   Basic Metabolic Panel: Recent Labs  Lab 04/22/19 0137  NA 137  K 4.3  CL 105  CO2 22  GLUCOSE 118*  BUN 29*  CREATININE 1.51*  CALCIUM 8.9   GFR: Estimated Creatinine Clearance: 45.8 mL/min (A) (by C-G formula based on SCr of 1.51 mg/dL (H)). Liver Function Tests: Recent Labs  Lab 04/22/19 0137  AST 31  ALT 27  ALKPHOS 88  BILITOT 0.9  PROT 6.9  ALBUMIN 3.7   Recent Labs  Lab 04/22/19 0137  LIPASE 23   No results for input(s): AMMONIA in the last 168 hours. Coagulation Profile: Recent Labs  Lab 04/22/19 0411  INR 1.5*   Cardiac Enzymes: No results for input(s): CKTOTAL, CKMB, CKMBINDEX, TROPONINI in the last 168 hours. BNP (last 3 results) No results for input(s): PROBNP in the last 8760 hours. HbA1C: No results for input(s): HGBA1C in the last 72 hours. CBG: No results for input(s): GLUCAP in the last 168 hours. Lipid Profile: Recent Labs    04/22/19 0411  CHOL 92  HDL 28*  LDLCALC 47  TRIG 83  CHOLHDL 3.3   Thyroid Function Tests: No results for input(s): TSH, T4TOTAL, FREET4, T3FREE, THYROIDAB  in the last 72 hours. Anemia Panel: No results for input(s): VITAMINB12, FOLATE, FERRITIN, TIBC, IRON, RETICCTPCT in the last 72 hours. Sepsis Labs: No results for input(s): PROCALCITON, LATICACIDVEN in the last 168 hours.  No results found for this or any previous visit (from the past 240 hour(s)).       Radiology Studies: CT Angio Chest/Abd/Pel for Dissection W and/or W/WO  Result Date: 04/22/2019 CLINICAL DATA:  Back pain. EXAM: CT ANGIOGRAPHY CHEST, ABDOMEN AND PELVIS TECHNIQUE: Multidetector CT imaging through the chest, abdomen and pelvis was performed using the standard protocol during bolus administration of intravenous contrast.  Multiplanar reconstructed images and MIPs were obtained and reviewed to evaluate the vascular anatomy. CONTRAST:  OMNIPAQUE IOHEXOL 350 MG/ML SOLN COMPARISON:  06/15/2015 FINDINGS: CTA CHEST FINDINGS Cardiovascular: There is no thoracic aortic dissection. There is no large centrally located pulmonary embolism. Coronary artery calcifications are noted. There are atherosclerotic changes of the thoracic aorta. The patient is status post prior median sternotomy. There is no large pericardial effusion. Mediastinum/Nodes: --No mediastinal or hilar lymphadenopathy. --No axillary lymphadenopathy. --No supraclavicular lymphadenopathy. --Normal thyroid gland. --The esophagus is unremarkable Lungs/Pleura: There is a partially calcified mass in the right upper lobe measuring approximately 2.4 cm. There this is favored to represent a benign process given the calcifications. is no large pleural effusion. Musculoskeletal: No chest wall abnormality. No acute or significant osseous findings. Review of the MIP images confirms the above findings. CTA ABDOMEN AND PELVIS FINDINGS VASCULAR Aorta: Atherosclerotic changes are noted throughout the aorta without evidence for an aneurysm. Celiac: Patent without evidence of aneurysm, dissection, vasculitis or significant stenosis. SMA: Patent without evidence of aneurysm, dissection, vasculitis or significant stenosis. Renals: Both renal arteries are patent without evidence of aneurysm, dissection, vasculitis, fibromuscular dysplasia or significant stenosis. IMA: Patent without evidence of aneurysm, dissection, vasculitis or significant stenosis. Inflow: Patent without evidence of aneurysm, dissection, vasculitis or significant stenosis. Veins: No obvious venous abnormality within the limitations of this arterial phase study. Review of the MIP images confirms the above findings. NON-VASCULAR Hepatobiliary: The liver is normal. Normal gallbladder.There is no biliary ductal dilation.  Pancreas: Normal contours without ductal dilatation. No peripancreatic fluid collection. Spleen: No splenic laceration or hematoma. Adrenals/Urinary Tract: --Adrenal glands: No adrenal hemorrhage. --Right kidney/ureter: There appears to be some cortical scarring involving the upper pole the right kidney. There is no hydronephrosis. --Left kidney/ureter: No hydronephrosis or perinephric hematoma. --Urinary bladder: Unremarkable. Stomach/Bowel: --Stomach/Duodenum: No hiatal hernia or other gastric abnormality. Normal duodenal course and caliber. --Small bowel: No dilatation or inflammation. --Colon: No focal abnormality. --Appendix: Normal. Lymphatic: --No retroperitoneal lymphadenopathy. --No mesenteric lymphadenopathy. --No pelvic or inguinal lymphadenopathy. Reproductive: Unremarkable Other: No ascites or free air. There is a small fat containing left inguinal hernia. Musculoskeletal. There are multilevel degenerative changes throughout the visualized thoracolumbar spine, greatest at the L2-L3 and L4-L5 levels. There is some fat stranding in the right inguinal region. Review of the MIP images confirms the above findings. IMPRESSION: 1. No evidence for aortic dissection. 2. There is some fat stranding in the right inguinal region. This may be secondary to recent intervention in this location. 3. Multilevel degenerative changes of the lumbar spine. Aortic Atherosclerosis (ICD10-I70.0). Electronically Signed   By: Katherine Mantle M.D.   On: 04/22/2019 02:56        Scheduled Meds: . [START ON 04/23/2019] aspirin EC  81 mg Oral Daily  . atorvastatin  80 mg Oral q1800  . clopidogrel  75 mg Oral Q breakfast  .  finasteride  5 mg Oral Daily  . furosemide  20 mg Oral QHS  . gabapentin  600 mg Oral TID  . [START ON 04/23/2019] levothyroxine  25 mcg Oral Once per day on Sun Tue Thu Sat  . [START ON 04/25/2019] levothyroxine  50 mcg Oral Once per day on Mon Wed Fri  . lisinopril  2.5 mg Oral Daily  .  metoprolol succinate  25 mg Oral Daily  . metoprolol tartrate  12.5 mg Oral BID  . mexiletine  300 mg Oral TID  . nitroGLYCERIN  1 inch Topical Q6H  . oxybutynin  10 mg Oral QHS  . [START ON 04/23/2019] spironolactone  12.5 mg Oral Daily  . tamsulosin  0.4 mg Oral Daily   Continuous Infusions: . sodium chloride 75 mL/hr at 04/22/19 0430  . sodium chloride    . heparin 1,000 Units/hr (04/22/19 0418)     LOS: 0 days    Time spent: 30 mins    Wyvonnia Dusky, MD [facility] Pager [phone]  If 7PM-7AM, please contact night-coverage [email or web address] Password [email or web address] 04/22/2019, 8:59 AM

## 2019-04-22 NOTE — ED Notes (Signed)
Patient on stretcher in nad.  Says no pain at this time.  Both ivs infusing without problem.

## 2019-04-22 NOTE — ED Provider Notes (Signed)
[facility] Emergency Department Provider Note   ____________________________________________   First MD Initiated Contact with Patient 04/22/19 0131     (approximate)  I have reviewed the triage vital signs and the nursing notes.   HISTORY  Chief Complaint Chest Pain    HPI Andrew [name] is a 73 y.o. male brought to the ED from home via EMS with a chief complaint of back and chest pain.  Patient reports thoracic back pain radiating into his chest.  Does feel like tearing sensation.  Patient with a history of atrial fibrillation on Pradaxa, CAD, ischemic cardiomyopathy with EF 30 to 35%, ventricular tachycardia status post AICD.  Recently had an overnight hospitalization 12/1-12/2 at [facility] for his defibrillator firing.  Reports cardiac cath and ablation at the [facility] 12/11.  Also notes he has been belching all day.  Ate reheated fish for dinner.  Denies fever, cough, shortness of breath, abdominal pain, nausea, vomiting or dizziness.  Patient took 1 sublingual nitroglycerin at home which relieved his symptoms.       Past Medical History:  Diagnosis Date   Atrial fibrillation (HCC)    CAD (coronary artery disease)    a. s/p CABG 1997;  b. s/p PCI to [facility] 2006;  c. s/p BMS x 3 to [facility] 10/10;  d.  Myoview 2010: EF 24%, basal inf, mid inf. apical inf, basal inf-lat and mid IL scar, no isch;  e. cath 1/11: S-RCA occluded, S-OM chronically occluded, L-LAD ok with L-R collaterals (med. Tx)   HLD (hyperlipidemia)    HTN (hypertension)    Ischemic cardiomyopathy    a. EF 25-35%;   b. echo 3/11: EF 35%, mild LVH, severe inf/post HK, lat and apical HK, grade 1 diast dysfxn, mild AI, mild to mod MR, PASP 32   Systolic CHF, chronic (HCC)    Ventricular tachycardia (HCC)    a. s/p AICD 2006;   b. Amio intolerant due to liver and thyroid issues;   c. 2/12:  quinidine added to mexilitine  (consider Tikosyn in future??)    Patient Active Problem List    Diagnosis Date Noted   Non-STEMI (non-ST elevated myocardial infarction) (HCC) 04/22/2019   Ventricular tachycardia (HCC) 04/06/2019   Defibrillator discharge    Ingrowing nail 12/20/2018   Pain due to onychomycosis of toenails of both feet 11/04/2018   Coagulation disorder (HCC) 11/04/2018   Acute renal failure (ARF) (HCC) 06/15/2015   Hyponatremia 06/15/2015   Visual hallucinations 06/15/2015   Chronic systolic heart failure (HCC) 08/30/2010   HYPOTENSION 07/23/2009   TRANSAMINASES, SERUM, ELEVATED 07/23/2009   HYPERCHOLESTEROLEMIA  IIA 06/21/2009   CAD, ARTERY BYPASS GRAFT 06/21/2009   CARDIOMYOPATHY, ISCHEMIC 06/21/2009   AV BLOCK, 1ST DEGREE 06/21/2009   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 Authorizing Provider  acetaminophen (TYLENOL) 325 MG tablet Take 975 mg by mouth every 4 (four) hours as needed for mild pain.    Yes [provider]  atorvastatin (LIPITOR) 80 MG tablet Take 80 mg by  mouth daily.   Yes [provider]  clopidogrel (PLAVIX) 75 MG tablet Take 75 mg by mouth at bedtime.   Yes [provider]  dabigatran (PRADAXA) 150 MG CAPS capsule Take 150 mg by mouth 2 (two) times daily.   Yes [provider]  finasteride (PROSCAR) 5 MG tablet Take 5 mg by mouth daily.   Yes [provider]  furosemide (LASIX) 20 MG tablet Take 20 mg by mouth at bedtime.   Yes [provider]  gabapentin (NEURONTIN) 300 MG capsule Take 600 mg by mouth 3 (three) times daily.     Yes [provider]  levothyroxine (SYNTHROID) 25 MCG tablet Take 25 mcg by mouth as directed. 1 tab tues thurs sat sun 2 mon weds fri   Yes [provider]  lisinopril (ZESTRIL) 2.5 MG tablet Take 2.5 mg by mouth daily.    Yes [provider]  Menthol-Methyl Salicylate (THERA-GESIC EX) Apply 1 application topically 3 (three) times daily as needed (for pain).   Yes [provider]  methocarbamol (ROBAXIN) 750 MG tablet Take 750 mg by mouth 2 (two) times daily as needed for muscle spasms.   Yes [provider]  metoprolol succinate (TOPROL-XL) 25 MG 24 hr tablet Take 25 mg by mouth daily.   Yes [provider]  mexiletine (MEXITIL) 150 MG capsule Take 2 capsules (300 mg total) by mouth 3 (three) times daily. 04/06/19  Yes Graciella Freer, PA-C  niacin 500 MG tablet Take 500 mg by mouth at bedtime.   Yes [provider]  oxybutynin (DITROPAN-XL) 10 MG 24 hr tablet Take 10 mg by mouth at bedtime.   Yes [provider]  spironolactone (ALDACTONE) 25 MG tablet Take 12.5 mg by mouth daily.   Yes [provider]  Tamsulosin HCl (FLOMAX) 0.4 MG CAPS Take 0.4 mg by mouth daily.    Yes [provider]  nitroGLYCERIN (NITROSTAT) 0.4 MG SL tablet Place 0.4 mg under the tongue every 5 (five) minutes as needed for chest pain.     [provider]    Allergies Amiodarone  Family History  Problem Relation Age of Onset   Heart failure Mother        congestive   Heart attack Brother 51       hx of CABG    Social History Social History   Tobacco Use   Smoking status: Former Smoker    Packs/day: 1.00    Years: 50.00    Pack years: 50.00    Types: Cigarettes    Quit date: 05/05/2002    Years since quitting: 16.9   Smokeless tobacco: Never Used  Substance Use Topics   Alcohol use: Yes    Comment: 3 beers weekly   Drug use: No    Review of Systems  Constitutional: No fever/chills Eyes:  No visual changes. ENT: No sore throat. Cardiovascular: Positive for chest pain. Respiratory: Denies shortness of breath. Gastrointestinal: No abdominal pain.  No nausea, no vomiting.  No diarrhea.  No constipation. Genitourinary: Negative for dysuria. Musculoskeletal: Positive for back pain. Skin: Negative for rash. Neurological: Negative for headaches, focal weakness or numbness.   ____________________________________________   PHYSICAL EXAM:  VITAL SIGNS: ED Triage Vitals [04/22/19 0130]  Enc Vitals Group     BP      Pulse      Resp      Temp      Temp src      SpO2 98 %  Weight      Height      Head Circumference      Peak Flow      Pain Score      Pain Loc      Pain Edu?      Excl. in GC?     Constitutional: Alert and oriented. Well appearing and in no acute distress. Eyes: Conjunctivae are normal. PERRL. EOMI. Head: Atraumatic. Nose: No congestion/rhinnorhea. Mouth/Throat: Mucous membranes are moist.  Oropharynx non-erythematous. Neck: No stridor.   Cardiovascular: Normal rate, regular rhythm. Grossly normal heart sounds.  Good peripheral circulation.  Pacemaker. Respiratory: Normal respiratory effort.  No retractions. Lungs CTAB. Gastrointestinal: Soft and nontender to light or deep palpation. No distention. No abdominal bruits. No CVA tenderness. Musculoskeletal: No lower extremity tenderness nor edema.  No joint effusions. Neurologic:  Normal speech and language. No gross focal neurologic deficits are appreciated.  Skin:  Skin is warm, dry and intact. No rash noted. Psychiatric: Mood and affect are normal. Speech and behavior are normal.  ____________________________________________   LABS (all labs ordered are listed, but only abnormal results are displayed)  Labs Reviewed  CBC WITH DIFFERENTIAL/PLATELET - Abnormal; Notable for the following components:      Result Value   RBC 4.01 (*)    Hemoglobin 12.3 (*)    HCT 37.3 (*)    RDW 15.8 (*)     All other components within normal limits  COMPREHENSIVE METABOLIC PANEL - Abnormal; Notable for the following components:   Glucose, Bld 118 (*)    BUN 29 (*)    Creatinine, Ser 1.51 (*)    GFR calc non Af Amer 45 (*)    GFR calc Af Amer 52 (*)    All other components within normal limits  APTT - Abnormal; Notable for the following components:   aPTT 66 (*)    All other components within normal limits  PROTIME-INR - Abnormal; Notable for the following components:   Prothrombin Time 17.9 (*)    INR 1.5 (*)    All other components within normal limits  HEPARIN LEVEL (UNFRACTIONATED) - Abnormal; Notable for the following components:   Heparin Unfractionated <0.10 (*)    All other components within normal limits  LIPID PANEL - Abnormal; Notable for the following components:   HDL 28 (*)    All other components within normal limits  TROPONIN I (HIGH SENSITIVITY) - Abnormal; Notable for the following components:   Troponin I (High Sensitivity) 200 (*)    All other components within normal limits  TROPONIN I (HIGH SENSITIVITY) - Abnormal; Notable for the following components:   Troponin I (High Sensitivity) 190 (*)    All other components within normal limits  SARS CORONAVIRUS 2 (TAT 6-24 HRS)  LIPASE, BLOOD   ____________________________________________  EKG  ED ECG REPORT I, Chermaine Schnyder J, the attending physician, personally viewed and interpreted this ECG.   Date: 04/22/2019  EKG Time: 0131  Rate: 73  Rhythm: Accelerated junctional  Axis: LAD  Intervals:left posterior fascicular block  ST&T Change: Nonspecific  ____________________________________________  RADIOLOGY  ED MD interpretation: No dissection  Official radiology report(s): CT Angio Chest/Abd/Pel for Dissection W and/or W/WO  Result Date: 04/22/2019 CLINICAL DATA:  Back pain. EXAM: CT ANGIOGRAPHY CHEST, ABDOMEN AND PELVIS TECHNIQUE: Multidetector CT imaging through the chest, abdomen and pelvis was performed  using the standard protocol during bolus administration of intravenous contrast. Multiplanar reconstructed images and MIPs were obtained and reviewed to evaluate the vascular anatomy. CONTRAST:  138mL OMNIPAQUE IOHEXOL 350 MG/ML SOLN COMPARISON:  06/15/2015 FINDINGS: CTA CHEST FINDINGS Cardiovascular: There is no thoracic aortic dissection. There is no large centrally located pulmonary embolism. Coronary artery calcifications are noted. There are atherosclerotic changes of the thoracic aorta. The patient is status post prior median sternotomy. There is no large pericardial effusion. Mediastinum/Nodes: --No mediastinal or hilar lymphadenopathy. --No axillary lymphadenopathy. --No supraclavicular lymphadenopathy. --Normal thyroid gland. --The esophagus is unremarkable Lungs/Pleura: There is a partially calcified mass in the right upper lobe measuring approximately 2.4 cm. There this is favored to represent a benign process given the calcifications. is no large pleural effusion. Musculoskeletal: No chest wall abnormality. No acute or significant osseous findings. Review of the MIP images confirms the above findings. CTA ABDOMEN AND PELVIS FINDINGS VASCULAR Aorta: Atherosclerotic changes are noted throughout the aorta without evidence for an aneurysm. Celiac: Patent without evidence of aneurysm, dissection, vasculitis or significant stenosis. SMA: Patent without evidence of aneurysm, dissection, vasculitis or significant stenosis. Renals: Both renal arteries are patent without evidence of aneurysm, dissection, vasculitis, fibromuscular dysplasia or significant stenosis. IMA: Patent without evidence of aneurysm, dissection, vasculitis or significant stenosis. Inflow: Patent without evidence of aneurysm, dissection, vasculitis or significant stenosis. Veins: No obvious venous abnormality within the limitations of this arterial phase study. Review of the MIP images confirms the above findings. NON-VASCULAR Hepatobiliary:  The liver is normal. Normal gallbladder.There is no biliary ductal dilation. Pancreas: Normal contours without ductal dilatation. No peripancreatic fluid collection. Spleen: No splenic laceration or hematoma. Adrenals/Urinary Tract: --Adrenal glands: No adrenal hemorrhage. --Right kidney/ureter: There appears to be some cortical scarring involving the upper pole the right kidney. There is no hydronephrosis. --Left kidney/ureter: No hydronephrosis or perinephric hematoma. --Urinary bladder: Unremarkable. Stomach/Bowel: --Stomach/Duodenum: No hiatal hernia or other gastric abnormality. Normal duodenal course and caliber. --Small bowel: No dilatation or inflammation. --Colon: No focal abnormality. --Appendix: Normal. Lymphatic: --No retroperitoneal lymphadenopathy. --No mesenteric lymphadenopathy. --No pelvic or inguinal lymphadenopathy. Reproductive: Unremarkable Other: No ascites or free air. There is a small fat containing left inguinal hernia. Musculoskeletal. There are multilevel degenerative changes throughout the visualized thoracolumbar spine, greatest at the L2-L3 and L4-L5 levels. There is some fat stranding in the right inguinal region. Review of the MIP images confirms the above findings. IMPRESSION: 1. No evidence for aortic dissection. 2. There is some fat stranding in the right inguinal region. This may be secondary to recent intervention in this location. 3. Multilevel degenerative changes of the lumbar spine. Aortic Atherosclerosis (ICD10-I70.0). Electronically Signed   By: Constance Holster M.D.   On: 04/22/2019 02:56    ____________________________________________   PROCEDURES  Procedure(s) performed (including Critical Care):  Procedures  CRITICAL CARE Performed by: Paulette Blanch   Total critical care time: 45 minutes  Critical care time was exclusive of separately billable procedures and treating other patients.  Critical care was necessary to treat or prevent imminent or  life-threatening deterioration.  Critical care was time spent personally by me on the following activities: development of treatment plan with patient and/or surrogate as well as nursing, discussions with consultants, evaluation of patient's response to treatment, examination of patient, obtaining history from patient or surrogate, ordering and performing treatments and interventions, ordering and review of laboratory studies, ordering and review of radiographic studies, pulse oximetry and re-evaluation of patient's condition.  ____________________________________________   INITIAL IMPRESSION / ASSESSMENT AND PLAN / ED COURSE  As part of my medical decision making, I reviewed the following data within the [facility]  notes reviewed and incorporated, Labs reviewed, EKG interpreted, Old chart reviewed, Radiograph reviewed and Notes from prior ED visits     Andrew [name] was evaluated in Emergency Department on 04/22/2019 for the symptoms described in the history of present illness. He was evaluated in the context of the global COVID-19 pandemic, which necessitated consideration that the patient might be at risk for infection with the SARS-CoV-2 virus that causes COVID-19. Institutional protocols and algorithms that pertain to the evaluation of patients at risk for COVID-19 are in a state of rapid change based on information released by regulatory bodies including the CDC and federal and state organizations. These policies and algorithms were followed during the patient's care in the ED.    73 year old male with CAD recently status post cardiac cath and ablation presenting with back to chest pain. Differential diagnosis includes, but is not limited to, ACS, aortic dissection, pulmonary embolism, cardiac tamponade, pneumothorax, pneumonia, pericarditis, myocarditis, GI-related causes including esophagitis/gastritis, and musculoskeletal chest wall pain.    Urgent CTA chest to  evaluate for dissection.  Will obtain basic labs including LFTs/lipase.  Pain slowly started to return.  Administer morphine and reassess.   Clinical Course as of Apr 21 536  Fri Apr 22, 2019  [id number] No dissection.  Noted troponin.  Patient will sign consent form to allow me to obtain his records from the [facility].  In the meantime will initiate heparin bolus and drip.  Discussed with patient his preference for hospitalization and patient prefers to stay here.   [JS]  [id number] Of note, paperwork was faxed to [place] from the [facility] and is on the patient's chart.   [JS]    Clinical Course User Index [JS] Irean Hong, MD     ____________________________________________   FINAL CLINICAL IMPRESSION(S) / ED DIAGNOSES  Final diagnoses:  Chest pain, unspecified type  Unstable angina [facility])     ED Discharge Orders    None       Note:  This document was prepared using Dragon voice recognition software and may include unintentional dictation errors.   Irean Hong, MD 04/22/19 [phone]

## 2019-04-22 NOTE — Consult Note (Signed)
Cardiology Consultation:   Patient ID: Andrew [name]; [id number]; [birth year]   Admit date: 04/22/2019 Date of Consult: 04/22/2019  Primary Care Provider: Dellia Nims, MD Primary Cardiologist: [facility] Primary Electrophysiologist:  [facility]    Patient Profile:   Andrew [name] is a 73 y.o. male with a hx of CAD status post CABG in 1997 with subsequent PCI's as outlined below, HFrEF secondary to ICM with EF of 20 to 30%, VT/VF status post recent ICD firing x2 earlier this month as outlined below status post VT ablation last week at [facility] intolerant to amiodarone secondary to liver and thyroid dysfunction on quinidine and mexiletine, PAF on Pradaxa, agent orange exposure, HTN, HLD, and tobacco abuse who is being seen today for the evaluation of elevated troponin at the request of Dr. Arville Care.  History of Present Illness:   Andrew [name] was previously followed by Dr. Graciela Husbands remotely though had subsequently followed up with the [facility].  Patient underwent CABG in 1997 with subsequent PCI to the vein graft RCA in 2006 with repeat PCI/BMS to the vein graft RCA in 2010.  LHC in 05/1998 Levan showed patent LIMA to LAD (collaterals as well as an occluded vein graft to RCA and vein graft to OM which was chronically occluded.  Echo from 07/2009 showed an EF of 35% with mild LVH, severe inferior posterior hypokinesis, lateral and apical hypokinesis, grade 1 diastolic dysfunction, mild aortic insufficiency, mild to moderate regurgitation, PASP 32 mmHg.  Patient has history of recurrent VT with amiodarone subsequently being stopped secondary to thyroid and liver dysfunction.  He is chronically disabled secondary to back issues and agent orange exposure.  At baseline he is able to ambulate 25 to 30 yards prior to fatigue.  He was recently evaluated at [facility] on 04/05/2019 with dizziness followed by ICD shock x2 with ICD interrogation showing  appropriate response and shocks for VF.  High-sensitivity opponent was mildly elevated and peaking at 18.  Echo at that time showed an EF of 25 to 30%, inferior lateral akinesis, mildly dilated LV cavity size, grade 2 diastolic dysfunction, grossly normal RV systolic function, trivial pericardial effusion, mild mitral regurgitation.  He was evaluated by EP and continued on quinidine with titration of mexiletine to 300 mg 3 times daily.  He subsequently followed up with his primary cardiologist through the [facility] and reports undergoing LHC as well as VT ablation on 04/15/2019.  He reports no stents being placed at that time.  Details of these studies are not available for review.  He presented to [facility] early on 04/22/2019 with thoracic back pain radiating into his chest with associated abdominal discomfort and increase in belching which alleviated symptoms.  It was noted patient had recently eaten some leftover fish.  Pain was more epigastric in location.  There was no associated shortness of breath, palpitations, dizziness, presyncope, syncope, diaphoresis, lower extremity swelling, abdominal distention, orthopnea, PND, early satiety.  No device discharges.  Upon the patient's arrival to [facility] they were found to have stable vital signs. EKG showed accelerated junctional rhythm with occasional pacer spikes noted as detailed below, CTA chest/abdomen/pelvis showed no evidence of aortic dissection with some fat stranding in the right inguinal region as well as multilevel degenerative changes in the lumbar spine and aortic atherosclerosis. Labs showed an initial high-sensitivity troponin of 200 with a delta of 190, COVID-19 negative, normal lipase, potassium 4.3, BUN 29,  serum creatinine 1.51 which appears to be at his approximate baseline, normal liver function, Hgb 12.3.  In the ED, he was given full dose aspirin and started on heparin drip.  He was continued on prior to admission Plavix,, Toprol,  mexiletine, and lisinopril.  Upon looking at the patient's prior to admission medications it appears quinidine may have been discontinued at the [facility] though this remains uncertain.  There was a delay in cardiology seeing the patient as we were not notified until approximately 4 PM on 04/22/2019.    Past Medical History:  Diagnosis Date  . Atrial fibrillation ([place])   . CAD (coronary artery disease)    a. s/p CABG 1997;  b. s/p PCI to [facility] 2006;  c. s/p BMS x 3 to S-RCA 10/10;  d.  Myoview 2010: EF 24%, basal inf, mid inf. apical inf, basal inf-lat and mid IL scar, no isch;  e. cath 1/11: S-RCA occluded, S-OM chronically occluded, L-LAD ok with L-R collaterals (med. Tx)  . HLD (hyperlipidemia)   . HTN (hypertension)   . Ischemic cardiomyopathy    a. EF 25-35%;   b. echo 3/11: EF 35%, mild LVH, severe inf/post HK, lat and apical HK, grade 1 diast dysfxn, mild AI, mild to mod MR, PASP 32  . Systolic CHF, chronic ([place])   . Ventricular tachycardia ([place])    a. s/p AICD 2006;   b. Amio intolerant due to liver and thyroid issues;   c. 2/12:  quinidine added to mexilitine  (consider Tikosyn in future??)    Past Surgical History:  Procedure Laterality Date  . BLADDER TUMOR EXCISION    . CARDIAC CATHETERIZATION  2006,02/2009  . CORONARY ARTERY BYPASS GRAFT  1997  . Medtronic implantable cardioverter-dibrillator   2006   with sprint fidelis lead     Home Meds: Prior to Admission medications   Medication Sig Start Date End Date Taking? Authorizing Provider  acetaminophen (TYLENOL) 325 MG tablet Take 975 mg by mouth every 4 (four) hours as needed for mild pain.    Yes [provider]  atorvastatin (LIPITOR) 80 MG tablet Take 80 mg by mouth daily.   Yes [provider]  clopidogrel (PLAVIX) 75 MG tablet Take 75 mg by mouth at bedtime.   Yes [provider]  dabigatran (PRADAXA) 150 MG CAPS capsule Take 150 mg by mouth 2 (two) times daily.   Yes [provider]  finasteride (PROSCAR) 5 MG tablet Take 5 mg by mouth daily.   Yes [provider]  furosemide (LASIX) 20 MG tablet Take 20 mg by mouth at bedtime.   Yes [provider]  gabapentin (NEURONTIN) 300 MG capsule Take 600 mg by mouth 3 (three) times daily.    Yes [provider]  levothyroxine (SYNTHROID) 25 MCG tablet Take 25 mcg by mouth as directed. 1 tab tues thurs sat sun 2 mon weds fri   Yes [provider]  lisinopril (ZESTRIL) 2.5 MG tablet Take 2.5 mg by mouth daily.    Yes [provider]  Menthol-Methyl Salicylate (THERA-GESIC EX) Apply 1 application topically 3 (three) times daily as needed (for pain).   Yes [provider]  methocarbamol (ROBAXIN) 750 MG tablet Take 750 mg by mouth 2 (two) times daily as needed for muscle spasms.   Yes [provider]  metoprolol succinate (TOPROL-XL) 25 MG 24 hr tablet Take 25 mg by mouth daily.   Yes [provider]  mexiletine (MEXITIL) 150 MG capsule  Take 2 capsules (300 mg total) by mouth 3 (three) times daily. 04/06/19  Yes Graciella Freer, PA-C  niacin 500 MG tablet Take 500 mg by mouth at bedtime.   Yes [provider]  oxybutynin (DITROPAN-XL) 10 MG 24 hr tablet Take 10 mg by mouth at bedtime.   Yes [provider]  spironolactone (ALDACTONE) 25 MG tablet Take 12.5 mg by mouth daily.   Yes [provider]  Tamsulosin HCl (FLOMAX) 0.4 MG CAPS Take 0.4 mg by mouth daily.    Yes [provider]  nitroGLYCERIN (NITROSTAT) 0.4 MG SL tablet Place 0.4 mg under the tongue every 5 (five) minutes as needed for chest pain.     [provider]    Inpatient Medications: Scheduled Meds: . [START ON 04/23/2019] aspirin EC  81 mg Oral Daily  . atorvastatin  80 mg Oral q1800  . clopidogrel  75 mg Oral Q breakfast  . famotidine  40 mg Oral Daily  . finasteride  5 mg Oral Daily  . furosemide  20 mg Oral QHS  . gabapentin   600 mg Oral TID  . [START ON 04/23/2019] levothyroxine  25 mcg Oral Once per day on Sun Tue Thu Sat  . [START ON 04/25/2019] levothyroxine  50 mcg Oral Once per day on Mon Wed Fri  . lisinopril  2.5 mg Oral Daily  . metoprolol succinate  25 mg Oral Daily  . mexiletine  300 mg Oral TID  . nitroGLYCERIN  1 inch Topical Q6H  . oxybutynin  10 mg Oral QHS  . [START ON 04/23/2019] spironolactone  12.5 mg Oral Daily  . tamsulosin  0.4 mg Oral Daily   Continuous Infusions: . sodium chloride 75 mL/hr at 04/22/19 1050  . heparin 1,000 Units/hr (04/22/19 1259)   PRN Meds: acetaminophen, methocarbamol, morphine injection, nitroGLYCERIN, ondansetron (ZOFRAN) IV, zolpidem  Allergies:   Allergies  Allergen Reactions  . Amiodarone Other (See Comments)    Reaction:  Unknown     Social History:   Social History   Socioeconomic History  . Marital status: Married    Spouse name: Not on file  . Number of children: Not on file  . Years of education: Not on file  . Highest education level: Not on file  Occupational History  . Not on file  Tobacco Use  . Smoking status: Former Smoker    Packs/day: 1.00    Years: 50.00    Pack years: 50.00    Types: Cigarettes    Quit date: 05/05/2002    Years since quitting: 16.9  . Smokeless tobacco: Never Used  Substance and Sexual Activity  . Alcohol use: Yes    Comment: 3 beers weekly  . Drug use: No  . Sexual activity: Not on file  Other Topics Concern  . Not on file  Social History Narrative  . Not on file   Social Determinants of Health   Financial Resource Strain:   . Difficulty of Paying Living Expenses: Not on file  Food Insecurity:   . Worried About Programme researcher, broadcasting/film/video in the Last Year: Not on file  . Ran Out of Food in the Last Year: Not on file  Transportation Needs:   . Lack of Transportation (Medical): Not on file  . Lack of Transportation (Non-Medical): Not on file  Physical Activity:   . Days of Exercise per Week: Not on file   . Minutes of Exercise per Session: Not on file  Stress:   .  Feeling of Stress : Not on file  Social Connections:   . Frequency of Communication with Friends and Family: Not on file  . Frequency of Social Gatherings with Friends and Family: Not on file  . Attends Religious Services: Not on file  . Active Member of Clubs or Organizations: Not on file  . Attends Banker Meetings: Not on file  . Marital Status: Not on file  Intimate Partner Violence:   . Fear of Current or Ex-Partner: Not on file  . Emotionally Abused: Not on file  . Physically Abused: Not on file  . Sexually Abused: Not on file     Family History:  Family History  Problem Relation Age of Onset  . Heart failure Mother        congestive  . Heart attack Brother 59       hx of CABG    ROS:  Review of Systems  Constitutional: Positive for malaise/fatigue. Negative for chills, diaphoresis, fever and weight loss.  HENT: Negative for congestion.   Eyes: Negative for discharge and redness.  Respiratory: Negative for cough, hemoptysis, sputum production, shortness of breath and wheezing.   Cardiovascular: Positive for chest pain. Negative for palpitations, orthopnea, claudication, leg swelling and PND.  Gastrointestinal: Positive for abdominal pain and heartburn. Negative for blood in stool, melena, nausea and vomiting.  Genitourinary: Negative for hematuria.  Musculoskeletal: Positive for back pain. Negative for falls and myalgias.  Skin: Negative for rash.  Neurological: Positive for weakness. Negative for dizziness, tingling, tremors, sensory change, speech change, focal weakness and loss of consciousness.  Endo/Heme/Allergies: Does not bruise/bleed easily.  Psychiatric/Behavioral: Negative for substance abuse. The patient is not nervous/anxious.   All other systems reviewed and are negative.     Physical Exam/Data:   Vitals:   04/22/19 0530 04/22/19 0600 04/22/19 1127 04/22/19 1626  BP: 106/68  108/75 105/73 106/70  Pulse: 69 70 70 73  Resp: Temp:  97.8 F (36.6 C)  97.7 F (36.5 C)  TempSrc:  Oral  Oral  SpO2: 98% 97% 99% 100%  Weight:    90.1 kg  Height:     (1.702 m)    Intake/Output Summary (Last 24 hours) at 04/22/2019 1632 Last data filed at 04/22/2019 0900 Gross per 24 hour  Intake --  Output 450 ml  Net -450 ml   Filed Weights   04/22/19 0135 04/22/19 1626  Weight: 86.6 kg 90.1 kg   Body mass index is 31.11 kg/m.   Physical Exam: General: Well developed, well nourished, in no acute distress. Head: Normocephalic, atraumatic, sclera non-icteric, no xanthomas, nares without discharge.  Neck: Negative for carotid bruits. JVD not elevated. Lungs: Clear bilaterally to auscultation without wheezes, rales, or rhonchi. Breathing is unlabored. Heart: RRR with S1 S2. No murmurs, rubs, or gallops appreciated. Abdomen: Soft, non-tender, non-distended with normoactive bowel sounds. No hepatomegaly. No rebound/guarding. No obvious abdominal masses. Msk:  Strength and tone appear normal for age. Extremities: No clubbing or cyanosis. No edema. Distal pedal pulses are 2+ and equal bilaterally. Neuro: Alert and oriented X 3. No facial asymmetry. No focal deficit. Moves all extremities spontaneously. Psych:  Responds to questions appropriately with a normal affect.   EKG:  The EKG was personally reviewed and demonstrates: Accelerated junctional rhythm, 73 bpm, intermittent pacer spikes Telemetry:  Telemetry was personally reviewed and demonstrates: V-paced rhythm   Weights: Filed Weights   04/22/19 0135 04/22/19 1626  Weight: 86.6 kg 90.1 kg  Relevant CV Studies:  2D Echo 04/22/2019:  1. Left ventricular ejection fraction, by visual estimation, is 25 to 30%. The left ventricle has severely decreased function. There is no left ventricular hypertrophy.  2. Mildly dilated left ventricular internal cavity size.  3. The left ventricle demonstrates  regional wall motion abnormalities. Global hypokinesis with Severe hypokinesis /possiby akinesis of the inferior/posterior and apical wall. Unable to exclude lateral wall involvement.  4. Left ventricular diastolic parameters are consistent with Grade II diastolic dysfunction (pseudonormalization).  5. Global right ventricle has mildly reduced systolic function.The right ventricular size is normal. No increase in right ventricular wall thickness.  6. Left atrial size was mildly dilated.  7. Mildly elevated pulmonary artery systolic pressure.  8. Definity contrast agent was given IV to delineate the left ventricular endocardial borders. __________  2D Echo 04/06/2019:  1. Technically difficult study, even after contrast administration. Left ventricular ejection fraction, by visual estimation, is 25 to 30%. Inferior/lateral akinesis. The left ventricle has severely decreased function. There is no left ventricular  hypertrophy.  2. Mildly dilated left ventricular internal cavity size.  3. Left ventricular diastolic parameters are consistent with Grade II diastolic dysfunction (pseudonormalization).  4. Elevated left atrial pressure.  5. Global right ventricle is not well-visualized, but appears to have grossly normal systolic function.The right ventricular size is not well visualized.  6. Left atrial size was normal.  7. Right atrial size was normal.  8. Trivial pericardial effusion is present.  9. The mitral valve is normal in structure. Mild mitral valve regurgitation. 10. The tricuspid valve is not well visualized. Tricuspid valve regurgitation is not demonstrated. 11. The aortic valve is tricuspid. Aortic valve regurgitation is not visualized. No evidence of aortic valve sclerosis or stenosis. 12. The pulmonic valve was not well visualized. Pulmonic valve regurgitation is not visualized. 13. The inferior vena cava is normal in size with greater than 50% respiratory variability, suggesting right  atrial pressure of 3 mmHg. 14. TR signal is inadequate for assessing pulmonary artery systolic pressure.  Laboratory Data:  Chemistry Recent Labs  Lab 04/22/19 0137  NA 137  K 4.3  CL 105  CO2 22  GLUCOSE 118*  BUN 29*  CREATININE 1.51*  CALCIUM 8.9  GFRNONAA 45*  GFRAA 52*  ANIONGAP 10    Recent Labs  Lab 04/22/19 0137  PROT 6.9  ALBUMIN 3.7  AST 31  ALT 27  ALKPHOS 88  BILITOT 0.9   Hematology Recent Labs  Lab 04/22/19 0137  WBC 6.0  RBC 4.01*  HGB 12.3*  HCT 37.3*  MCV 93.0  MCH 30.7  MCHC 33.0  RDW 15.8*  PLT 174   Cardiac EnzymesNo results for input(s): TROPONINI in the last 168 hours. No results for input(s): TROPIPOC in the last 168 hours.  BNPNo results for input(s): BNP, PROBNP in the last 168 hours.  DDimer No results for input(s): DDIMER in the last 168 hours.  Radiology/Studies:      CT Angio Chest/Abd/Pel for Dissection W and/or W/WO  Result Date: 04/22/2019 IMPRESSION: 1. No evidence for aortic dissection. 2. There is some fat stranding in the right inguinal region. This may be secondary to recent intervention in this location. 3. Multilevel degenerative changes of the lumbar spine. Aortic Atherosclerosis (ICD10-I70.0). Electronically Signed   By: Katherine Mantle M.D.   On: 04/22/2019 02:56    Assessment and Plan:   1. Elevated troponin/chest pain/back pain/CAD status post CABG status post PCI: -Initial high-sensitivity troponin of 200 with a  delta of 190, possibly in the setting of patient's recent VT ablation on 04/15/2019 -Cycle 1 further troponin, if downtrending plan to discontinue heparin drip after discussion with MD -[facility] and reports no interventions needed at that time -[facility] as we were notified late on Friday evening of consult  -Currently, no plans for inpatient ischemic evaluation -Continue  Plavix as well as Pradaxa which he is on in place of aspirin given underlying A. fib -CTA chest/abdomen/pelvis negative for aortic dissection  3. HFrEF secondary to ICM/VT/VF: -Currently, patient is well compensated  -He has been receiving IV fluids at 75 mL/h since 430 this morning, discontinue these at this time given his underlying cardiomyopathy -Monitor closely for need of IV Lasix -Continue Toprol-XL, PTA oral Lasix for this evening, and lisinopril -Resume spironolactone as renal function and BP allow -Echo this admission is essentially unchanged compared to study performed just 16 days prior and continues to show a cardiomyopathy with an EF of 25 to 30% -With patient's loss of atrial kick he is at increased risk for volume overload and will need to be monitored closely -Medtronic device to be interrogated on 04/23/2019, I have spoken with Liborio NixonJanice and she will perform this herself with results being passed along to Dr. Johney FrameAllred who Dr. Mariah MillingGollan will sign out the patient to -He remains on mexiletine 300 mg 3 times daily at this time with further recommendations pending from EP perspective  4.  Accelerated junctional rhythm: -Hemodynamically stable and asymptomatic -Possibly in setting of the patient's titrated mexiletine -For now, he will be continued on Toprol-XL -May need to increase patient's backup pacing threshold, this can be discussed with EP after device is interrogated  5.  PAF: -Continue Toprol-XL and Pradaxa  6.  CKD stage III: -Renal function appears stable and at his approximate baseline -Discontinue fluids as above  7.  HLD: -Continue PTA Lipitor   For questions or updates, please contact [facility] Please consult [email or web address] for contact info under Cardiology/STEMI.   Signed, Eula Listenyan Delayna Sparlin, PA-C [facility] Pager: [phone] 04/22/2019, 4:32 PM

## 2019-04-22 NOTE — ED Notes (Signed)
Report given to Ashley, RN

## 2019-04-22 NOTE — ED Triage Notes (Signed)
PT to ED via EMS From home. C/o back pain that started this morning and turned into chest pain 8/10. PT hx of ablation and 3 MI's. PT took 1 SL nitro at home which helped with the pain. PT a/o.

## 2019-04-22 NOTE — H&P (Signed)
[place] [facility]   PATIENT NAME: Andrew [name]    MR#:  [id number]  DATE OF BIRTH:  [birth year]  DATE OF ADMISSION:  04/22/2019  PRIMARY CARE PHYSICIAN: Keplinger, Tawny AsalLynn E, MD   REQUESTING/REFERRING PHYSICIAN: Starr SinclairSanghi, Jade, MD CHIEF COMPLAINT:   Chief Complaint  Patient presents with  . Chest Pain    HISTORY OF PRESENT ILLNESS:  Andrew [name]  is a 73 y.o. pleasant Caucasian male with a known history of multiple medical problems that will be mentioned below, including coronary artery disease status post CABG, who presented to the emergency room with acute onset of midsternal chest pain felt as heaviness and graded 8/10 in severity with radiation to his back and relieved with sublingual nitroglycerin.  He denied any cough or wheezing or hemoptysis.  No fever or chills.  No nausea or vomiting or diaphoresis.  He denied any leg pain or edema.  He was recently hospitalized at [facility] for 1 night when he is AICD fired on December 1.  On Friday he had ablation of 2 areas per his report that were interfering with his AICD at the [facility] in [place], and we are likely giving him atrial fibrillation paroxysms.Marland Kitchen.   Upon presentation to the emergency room, vital signs were within normal.  Labs were remarkable for troponin I of 200 antibiotic 29 with a creatinine of 1.51 that is stable with mild anemia slightly worse than previous levels 2 weeks ago.  COVID-19 test is currently pending.  Chest CTA ruled out aortic dissection.  It showed multilevel degenerative changes of the lumbar spine.  EKG showed paced rhythm with a rate of 73.  The patient was given 2 mg of IV morphine sulfate and 4 mg of IV Zofran as well as heparin IV bolus and drip.  He will be admitted to a telemetry bed for further evaluation and management.  PAST MEDICAL HISTORY:   Past Medical History:  Diagnosis Date  . Atrial fibrillation (HCC)   . CAD (coronary artery disease)    a. s/p CABG 1997;  b.  s/p PCI to [facility]-RCA 2006;  c. s/p BMS x 3 to S-RCA 10/10;  d.  Myoview 2010: EF 24%, basal inf, mid inf. apical inf, basal inf-lat and mid IL scar, no isch;  e. cath 1/11: S-RCA occluded, S-OM chronically occluded, L-LAD ok with L-R collaterals (med. Tx)  . HLD (hyperlipidemia)   . HTN (hypertension)   . Ischemic cardiomyopathy    a. EF 25-35%;   b. echo 3/11: EF 35%, mild LVH, severe inf/post HK, lat and apical HK, grade 1 diast dysfxn, mild AI, mild to mod MR, PASP 32  . Systolic CHF, chronic (HCC)   . Ventricular tachycardia (HCC)    a. s/p AICD 2006;   b. Amio intolerant due to liver and thyroid issues;   c. 2/12:  quinidine added to mexilitine  (consider Tikosyn in future??)    PAST SURGICAL HISTORY:   Past Surgical History:  Procedure Laterality Date  . BLADDER TUMOR EXCISION    . CARDIAC CATHETERIZATION  2006,02/2009  . CORONARY ARTERY BYPASS GRAFT  1997  . Medtronic implantable cardioverter-dibrillator   2006   with sprint fidelis lead    SOCIAL HISTORY:   Social History   Tobacco Use  . Smoking status: Former Smoker    Packs/day: 1.00    Years: 50.00    Pack years: 50.00    Types: Cigarettes    Quit date: 05/05/2002  Years since quitting: 16.9  . Smokeless tobacco: Never Used  Substance Use Topics  . Alcohol use: Yes    Comment: 3 beers weekly    FAMILY HISTORY:   Family History  Problem Relation Age of Onset  . Heart failure Mother        congestive  . Heart attack Brother 72       hx of CABG    DRUG ALLERGIES:   Allergies  Allergen Reactions  . Amiodarone Other (See Comments)    Reaction:  Unknown     REVIEW OF SYSTEMS:   ROS As per history of present illness. All pertinent systems were reviewed above. Constitutional,  HEENT, cardiovascular, respiratory, GI, GU, musculoskeletal, neuro, psychiatric, endocrine,  integumentary and hematologic systems were reviewed and are otherwise  negative/unremarkable except for positive findings mentioned above  in the HPI.   MEDICATIONS AT HOME:   Prior to Admission medications   Medication Sig Start Date End Date Taking? Authorizing Provider  acetaminophen (TYLENOL) 325 MG tablet Take 975 mg by mouth every 4 (four) hours as needed for mild pain.    Yes [provider]  atorvastatin (LIPITOR) 80 MG tablet Take 80 mg by mouth daily.   Yes [provider]  clopidogrel (PLAVIX) 75 MG tablet Take 75 mg by mouth at bedtime.   Yes [provider]  dabigatran (PRADAXA) 150 MG CAPS capsule Take 150 mg by mouth 2 (two) times daily.   Yes [provider]  finasteride (PROSCAR) 5 MG tablet Take 5 mg by mouth daily.   Yes [provider]  furosemide (LASIX) 20 MG tablet Take 20 mg by mouth at bedtime.   Yes [provider]  gabapentin (NEURONTIN) 300 MG capsule Take 600 mg by mouth 3 (three) times daily.    Yes [provider]  levothyroxine (SYNTHROID) 25 MCG tablet Take 25 mcg by mouth as directed. 1 tab tues thurs sat sun 2 mon weds fri   Yes [provider]  lisinopril (ZESTRIL) 2.5 MG tablet Take 2.5 mg by mouth daily.    Yes [provider]  Menthol-Methyl Salicylate (THERA-GESIC EX) Apply 1 application topically 3 (three) times daily as needed (for pain).   Yes [provider]  methocarbamol (ROBAXIN) 750 MG tablet Take 750 mg by mouth 2 (two) times daily as needed for muscle spasms.   Yes [provider]  metoprolol succinate (TOPROL-XL) 25 MG 24 hr tablet Take 25 mg by mouth daily.   Yes [provider]  mexiletine (MEXITIL) 150 MG capsule Take 2 capsules (300 mg total) by mouth 3 (three) times daily. 04/06/19  Yes Shirley Friar, PA-C  niacin 500 MG tablet Take 500 mg by mouth at bedtime.   Yes [provider]  oxybutynin (DITROPAN-XL) 10 MG 24 hr tablet Take 10 mg by mouth at bedtime.   Yes [provider]  spironolactone (ALDACTONE) 25 MG tablet Take 12.5 mg by mouth  daily.   Yes [provider]  Tamsulosin HCl (FLOMAX) 0.4 MG CAPS Take 0.4 mg by mouth daily.    Yes [provider]  nitroGLYCERIN (NITROSTAT) 0.4 MG SL tablet Place 0.4 mg under the tongue every 5 (five) minutes as needed for chest pain.     [provider]      VITAL SIGNS:  Blood pressure 108/75, pulse 70, temperature 97.8 F (36.6 C), temperature source Oral, resp. rate 14, height 5\' 7"  (1.702 m), weight 86.6 kg, SpO2 97 %.  PHYSICAL EXAMINATION:  Physical Exam  GENERAL:  73 y.o.-year-old pleasant Caucasian male patient lying in the bed with no acute distress.  EYES: Pupils equal, round, reactive to light and accommodation. No scleral icterus. Extraocular muscles intact.  HEENT: Head atraumatic, normocephalic. Oropharynx and nasopharynx clear.  NECK:  Supple, no jugular venous distention. No thyroid enlargement, no tenderness.  LUNGS: Normal breath sounds bilaterally, no wheezing, rales,rhonchi or crepitation. No use of accessory muscles of respiration.  CARDIOVASCULAR: Regular rate and rhythm, S1, S2 normal. No murmurs, rubs, or gallops.  ABDOMEN: Soft, nondistended, nontender. Bowel sounds present. No organomegaly or mass.  EXTREMITIES: No pedal edema, cyanosis, or clubbing.  NEUROLOGIC: Cranial nerves II through XII are intact. Muscle strength 5/5 in all extremities. Sensation intact. Gait not checked.  PSYCHIATRIC: The patient is alert and oriented x 3.  Normal affect and good eye contact. SKIN: No obvious rash, lesion, or ulcer.   LABORATORY PANEL:   CBC Recent Labs  Lab 04/22/19 0137  WBC 6.0  HGB 12.3*  HCT 37.3*  PLT 174   ------------------------------------------------------------------------------------------------------------------  Chemistries  Recent Labs  Lab 04/22/19 0137  NA 137  K 4.3  CL 105  CO2 22  GLUCOSE 118*  BUN 29*  CREATININE 1.51*  CALCIUM 8.9  AST 31  ALT 27  ALKPHOS 88  BILITOT 0.9    ------------------------------------------------------------------------------------------------------------------  Cardiac Enzymes No results for input(s): TROPONINI in the last 168 hours. ------------------------------------------------------------------------------------------------------------------  RADIOLOGY:  CT Angio Chest/Abd/Pel for Dissection W and/or W/WO  Result Date: 04/22/2019 CLINICAL DATA:  Back pain. EXAM: CT ANGIOGRAPHY CHEST, ABDOMEN AND PELVIS TECHNIQUE: Multidetector CT imaging through the chest, abdomen and pelvis was performed using the standard protocol during bolus administration of intravenous contrast. Multiplanar reconstructed images and MIPs were obtained and reviewed to evaluate the vascular anatomy. CONTRAST:  OMNIPAQUE IOHEXOL 350 MG/ML SOLN COMPARISON:  06/15/2015 FINDINGS: CTA CHEST FINDINGS Cardiovascular: There is no thoracic aortic dissection. There is no large centrally located pulmonary embolism. Coronary artery calcifications are noted. There are atherosclerotic changes of the thoracic aorta. The patient is status post prior median sternotomy. There is no large pericardial effusion. Mediastinum/Nodes: --No mediastinal or hilar lymphadenopathy. --No axillary lymphadenopathy. --No supraclavicular lymphadenopathy. --Normal thyroid gland. --The esophagus is unremarkable Lungs/Pleura: There is a partially calcified mass in the right upper lobe measuring approximately 2.4 cm. There this is favored to represent a benign process given the calcifications. is no large pleural effusion. Musculoskeletal: No chest wall abnormality. No acute or significant osseous findings. Review of the MIP images confirms the above findings. CTA ABDOMEN AND PELVIS FINDINGS VASCULAR Aorta: Atherosclerotic changes are noted throughout the aorta without evidence for an aneurysm. Celiac: Patent without evidence of aneurysm, dissection, vasculitis or significant stenosis. SMA: Patent  without evidence of aneurysm, dissection, vasculitis or significant stenosis. Renals: Both renal arteries are patent without evidence of aneurysm, dissection, vasculitis, fibromuscular dysplasia or significant stenosis. IMA: Patent without evidence of aneurysm, dissection, vasculitis or significant stenosis. Inflow: Patent without evidence of aneurysm, dissection, vasculitis or significant stenosis. Veins: No obvious venous abnormality within the limitations of this arterial phase study. Review of the MIP images confirms the above findings. NON-VASCULAR Hepatobiliary: The liver is normal. Normal gallbladder.There is no biliary ductal dilation. Pancreas: Normal contours without ductal dilatation. No peripancreatic fluid collection. Spleen: No splenic laceration or hematoma. Adrenals/Urinary Tract: --Adrenal glands: No adrenal hemorrhage. --Right kidney/ureter: There appears to be some cortical scarring involving the upper pole the right kidney. There is no hydronephrosis. --Left kidney/ureter:  No hydronephrosis or perinephric hematoma. --Urinary bladder: Unremarkable. Stomach/Bowel: --Stomach/Duodenum: No hiatal hernia or other gastric abnormality. Normal duodenal course and caliber. --Small bowel: No dilatation or inflammation. --Colon: No focal abnormality. --Appendix: Normal. Lymphatic: --No retroperitoneal lymphadenopathy. --No mesenteric lymphadenopathy. --No pelvic or inguinal lymphadenopathy. Reproductive: Unremarkable Other: No ascites or free air. There is a small fat containing left inguinal hernia. Musculoskeletal. There are multilevel degenerative changes throughout the visualized thoracolumbar spine, greatest at the L2-L3 and L4-L5 levels. There is some fat stranding in the right inguinal region. Review of the MIP images confirms the above findings. IMPRESSION: 1. No evidence for aortic dissection. 2. There is some fat stranding in the right inguinal region. This may be secondary to recent intervention  in this location. 3. Multilevel degenerative changes of the lumbar spine. Aortic Atherosclerosis (ICD10-I70.0). Electronically Signed   By: Katherine Mantle M.D.   On: 04/22/2019 02:56      IMPRESSION AND PLAN:   1.  Chest pain with elevated troponin I, concerning for acute coronary syndrome/non-STEMI.  The patient will be admitted to a telemetry bed.  We will continue him on IV heparin drip.  He will be continued on aspirin and Plavix.  High-dose statin will be given as well as beta-blocker therapy.  He was chest pain-free during my interview.  A cardiology consultation will be obtained by Dr. [name] who was notified about the patient.  2.  Stage IIIa-3B chronic kidney disease.  Currently stable.  He will be hydrated with IV normal saline.  3.  Paroxysmal atrial fibrillation status post recent ablation.  Pradaxa is being continued for now.  4.  Hypothyroidism.  We will check TSH and continue Synthroid.  5.  Hypertension.  We will continue his Zestril as well as mexiletine..  6.  Dyslipidemia.  Statin therapy will be resumed.  7.  BPH.  Flomax will be resumed.  8.  DVT prophylaxis.  The patient will be placed on IV heparin.   All the records are reviewed and case discussed with ED provider. The plan of care was discussed in details with the patient (and family). I answered all questions. The patient agreed to proceed with the above mentioned plan. Further management will depend upon hospital course.   CODE STATUS: Full code  TOTAL TIME TAKING CARE OF THIS PATIENT: .    Hannah Beat M.D on 04/22/2019 at 9:03 AM  [facility]   From 7 PM-7 AM, contact night-coverage [email or web address]  CC: Primary care physician; Dellia Nims, MD   Note: This dictation was prepared with Dragon dictation along with smaller phrase technology. Any transcriptional errors that result from this process are unintentional.

## 2019-04-22 NOTE — Consult Note (Signed)
ANTICOAGULATION CONSULT NOTE - Follow Up Consult  Pharmacy Consult for Heparin Infusion  Indication: chest pain/ACS  Allergies  Allergen Reactions  . Amiodarone Other (See Comments)    Reaction:  Unknown     Patient Measurements: Height: 5\' 7"  (170.2 cm) Weight: 198 lb 9.6 oz (90.1 kg) IBW/kg (Calculated) : 66.1  Vital Signs: Temp: 97.9 F (36.6 C) (12/18 1956) Temp Source: Oral (12/18 1956) BP: 94/64 (12/18 1956) Pulse Rate: 75 (12/18 1956)  Labs: Recent Labs    04/22/19 0137 04/22/19 0411 04/22/19 1226 04/22/19 1749 04/22/19 2023  HGB 12.3*  --   --   --   --   HCT 37.3*  --   --   --   --   PLT 174  --   --   --   --   APTT  --  66*  --   --   --   LABPROT  --  17.9*  --   --   --   INR  --  1.5*  --   --   --   HEPARINUNFRC  --  <0.10* 0.35  --  0.39  CREATININE 1.51*  --   --   --   --   TROPONINIHS 200* 190*  --  131*  --     Estimated Creatinine Clearance: 46.7 mL/min (A) (by C-G formula based on SCr of 1.51 mg/dL (H)).  Assessment: Pharmacy consulted for heparin infusion dosing and  Monitoring in 73 yo male admitted with chest pain. Patient does have PMH of A. Fib and takes Pradaxa PTA. Last reported dose according to patient was 12/17 @ 2300.   Goal of Therapy:  Heparin level 0.3-0.7 units/ml Monitor platelets by anticoagulation protocol: Yes   Plan:  12/18 @ 2030 HL 0.39 therapeutic. Will continue current rate and will recheck HL/CBC w/ am labs. Will continue to monitor.  Tobie Lords, PharmD, BCPS Clinical Pharmacist 04/22/2019 10:27 PM

## 2019-04-22 NOTE — Progress Notes (Signed)
*  PRELIMINARY RESULTS* Echocardiogram 2D Echocardiogram has been performed.  Andrew [name] 04/22/2019, 10:08 AM

## 2019-04-22 NOTE — Consult Note (Signed)
ANTICOAGULATION CONSULT NOTE - Follow Up Consult  Pharmacy Consult for Heparin Infusion  Indication: chest pain/ACS  Allergies  Allergen Reactions  . Amiodarone Other (See Comments)    Reaction:  Unknown     Patient Measurements: Height: 5\' 7"  (170.2 cm) Weight: 191 lb (86.6 kg) IBW/kg (Calculated) : 66.1  Vital Signs: Temp: 97.8 F (36.6 C) (12/18 0600) Temp Source: Oral (12/18 0600) BP: 105/73 (12/18 1127) Pulse Rate: 70 (12/18 1127)  Labs: Recent Labs    04/22/19 0137 04/22/19 0411 04/22/19 1226  HGB 12.3*  --   --   HCT 37.3*  --   --   PLT 174  --   --   APTT  --  66*  --   LABPROT  --  17.9*  --   INR  --  1.5*  --   HEPARINUNFRC  --  <0.10* 0.35  CREATININE 1.51*  --   --   TROPONINIHS 200* 190*  --     Estimated Creatinine Clearance: 45.8 mL/min (A) (by C-G formula based on SCr of 1.51 mg/dL (H)).  Assessment: Pharmacy consulted for heparin infusion dosing and  Monitoring in 73 yo male admitted with chest pain. Patient does have PMH of A. Fib and takes Pradaxa PTA. Last reported dose according to patient was 12/17 @ 2300.   Goal of Therapy:  Heparin level 0.3-0.7 units/ml Monitor platelets by anticoagulation protocol: Yes   Plan:  Baseline labs HL <0.10, INR 1.5 aPTT 66 Hgb 12.3  Plt 174 After discussion with admitting provider about timing of patient's last Dabigatran dose, heparin bolus will be reduced by half.  Give heparin 2000 unit bolus x1. Start heparin infusion at 1000 units/hr Check anti-Xa level in 8 hours and daily while on heparin Continue to monitor H&H and platelets  12/18 @ 1226 HL= 0.35. Will continue current rate and check confirmatory level in 8 hours.  Noralee Space, PharmD, BCPS Clinical Pharmacist 04/22/2019 2:00 PM

## 2019-04-23 DIAGNOSIS — I2510 Atherosclerotic heart disease of native coronary artery without angina pectoris: Secondary | ICD-10-CM

## 2019-04-23 DIAGNOSIS — R0789 Other chest pain: Secondary | ICD-10-CM | POA: Diagnosis not present

## 2019-04-23 DIAGNOSIS — I472 Ventricular tachycardia: Secondary | ICD-10-CM | POA: Diagnosis not present

## 2019-04-23 DIAGNOSIS — I214 Non-ST elevation (NSTEMI) myocardial infarction: Secondary | ICD-10-CM

## 2019-04-23 DIAGNOSIS — I10 Essential (primary) hypertension: Secondary | ICD-10-CM | POA: Diagnosis not present

## 2019-04-23 LAB — CBC
HCT: 35.6 % — ABNORMAL LOW (ref 39.0–52.0)
Hemoglobin: 12.1 g/dL — ABNORMAL LOW (ref 13.0–17.0)
MCH: 30.7 pg (ref 26.0–34.0)
MCHC: 34 g/dL (ref 30.0–36.0)
MCV: 90.4 fL (ref 80.0–100.0)
Platelets: 178 10*3/uL (ref 150–400)
RBC: 3.94 MIL/uL — ABNORMAL LOW (ref 4.22–5.81)
RDW: 16 % — ABNORMAL HIGH (ref 11.5–15.5)
WBC: 5.3 10*3/uL (ref 4.0–10.5)
nRBC: 0 % (ref 0.0–0.2)

## 2019-04-23 LAB — BASIC METABOLIC PANEL
Anion gap: 8 (ref 5–15)
BUN: 22 mg/dL (ref 8–23)
CO2: 24 mmol/L (ref 22–32)
Calcium: 8.9 mg/dL (ref 8.9–10.3)
Chloride: 106 mmol/L (ref 98–111)
Creatinine, Ser: 1.25 mg/dL — ABNORMAL HIGH (ref 0.61–1.24)
GFR calc Af Amer: >60 mL/min (ref >60)
GFR calc non Af Amer: 57 mL/min — ABNORMAL LOW (ref >60)
Glucose, Bld: 132 mg/dL — ABNORMAL HIGH (ref 70–99)
Potassium: 4.4 mmol/L (ref 3.5–5.1)
Sodium: 138 mmol/L (ref 135–145)

## 2019-04-23 MED ORDER — FUROSEMIDE 10 MG/ML IJ SOLN
40.0000 mg | Freq: Once | INTRAMUSCULAR | Status: AC
  Administered 2019-04-23: 40 mg via INTRAVENOUS
  Filled 2019-04-23: qty 4

## 2019-04-23 MED ORDER — DABIGATRAN ETEXILATE MESYLATE 150 MG PO CAPS
150.0000 mg | ORAL_CAPSULE | Freq: Two times a day (BID) | ORAL | Status: DC
  Administered 2019-04-23: 150 mg via ORAL
  Filled 2019-04-23 (×2): qty 1

## 2019-04-23 MED ORDER — FUROSEMIDE 10 MG/ML IJ SOLN
INTRAMUSCULAR | Status: AC
  Filled 2019-04-23: qty 4

## 2019-04-23 NOTE — Progress Notes (Addendum)
Progress Note  Patient Name: Andrew [name] Date of Encounter: 04/23/2019  [facility]ologist:[facility]  Subjective   Patient denies any symptoms of chest pain, racing heart rate, or palpitation.  He reports he ambulated fine to the restroom this morning.  He mainly reports fatigue, having waken him from rest this morning.  He feels as if he has had some lower extremity edema, which is unusual for him.  No shortness of breath orthopnea reported.  Please see CV studies below for summary of Interrog[facility] and [facility] performed [facility]18.  These records are currently in the patient's paper chart.  Discussed adding the records to the patient's EMR today; however, on further inquiry, we are unable to scan these in on weekends.  Inpatient Medications    Scheduled Meds: . aspirin EC  81 mg Oral Daily  . atorvastatin  80 mg Oral q1800  . clopidogrel  75 mg Oral Q breakfast  . dabigatran  150 mg Oral Q12H  . famotidine  40 mg Oral Daily  . finasteride  5 mg Oral Daily  . furosemide  20 mg Oral QHS  . gabapentin  600 mg Oral TID  . levothyroxine  25 mcg Oral Once per day on Sun Tue Thu Sat  . [START ON 04/25/2019] levothyroxine  50 mcg Oral Once per day on Mon Wed Fri  . lisinopril  2.5 mg Oral Daily  . metoprolol succinate  25 mg Oral Daily  . mexiletine  300 mg Oral TID  . nitroGLYCERIN  1 inch Topical Q6H  . oxybutynin  10 mg Oral QHS  . spironolactone  12.5 mg Oral Daily  . tamsulosin  0.4 mg Oral Daily   Continuous Infusions:  PRN Meds: acetaminophen, methocarbamol, morphine injection, nitroGLYCERIN, ondansetron (ZOFRAN) IV, zolpidem   Vital Signs    Vitals:   04/22/19 1626 04/22/19 1956 04/23/19 0410 04/23/19 0735  BP: 106/70 94/64 97/62  109/70  Pulse: 73 75 70 69  Resp: 18 18 16 19   Temp: 97.7 F (36.5 C) 97.9 F (36.6 C) 98.4 F (36.9 C) (!) 97.4 F (36.3 C)  TempSrc: Oral Oral Oral Oral  SpO2: 100% 97% 97% 98%  Weight: 90.1 kg  90.2 kg   Height: 5\' 7"  (1.702 m)        Intake/Output Summary (Last 24 hours) at 04/23/2019 0838 Last data filed at 04/23/2019 0408 Gross per 24 hour  Intake --  Output 600 ml  Net -600 ml   Last 3 Weights 04/23/2019 04/22/2019 04/22/2019  Weight (lbs) 198 lb 12.8 oz 198 lb 9.6 oz 191 lb  Weight (kg) 90.175 kg 90.084 kg 86.637 kg      Telemetry    Paced rhythm, 70-80s - Personally Reviewed  ECG    No new tracings- Personally Reviewed  Physical Exam   GEN: No acute distress.   Neck: No JVD Cardiac: RRR, no murmurs, rubs, or gallops.  Respiratory: Clear to auscultation bilaterally. GI: Soft, nontender, non-distended  MS:  Trace bilateral dependent edema; No deformity. Neuro:  Nonfocal  Psych: Normal affect   Labs    High Sensitivity Troponin:   Recent Labs  Lab 04/05/19 2043 04/05/19 2219 04/22/19 0137 04/22/19 0411 04/22/19 1749  TROPONINIHS 16 18* 200* 190* 131*      Cardiac EnzymesNo results for input(s): TROPONINI in the last 168 hours. No results for input(s): TROPIPOC in the last 168 hours.   Chemistry Recent Labs  Lab 04/22/19 0137  NA 137  K 4.3  CL 105  CO2 22  GLUCOSE 118*  BUN 29*  CREATININE 1.51*  CALCIUM 8.9  PROT 6.9  ALBUMIN 3.7  AST 31  ALT 27  ALKPHOS 88  BILITOT 0.9  GFRNONAA 45*  GFRAA 52*  ANIONGAP 10     Hematology Recent Labs  Lab 04/22/19 0137  WBC 6.0  RBC 4.01*  HGB 12.3*  HCT 37.3*  MCV 93.0  MCH 30.7  MCHC 33.0  RDW 15.8*  PLT 174    BNPNo results for input(s): BNP, PROBNP in the last 168 hours.   DDimer No results for input(s): DDIMER in the last 168 hours.   Radiology    ECHOCARDIOGRAM COMPLETE  Result Date: 04/22/2019   ECHOCARDIOGRAM REPORT   Patient Name:   LODEN LAURENT Date of Exam: 04/22/2019 Medical Rec #:  [id number]       Height:       67.0 in Accession #:    [id number]      Weight:       191.0 lb Date of Birth:  Aug 18, 1945       BSA:          1.98 m Patient Age:    72 years        BP:           108/75 mmHg Patient  Gender: M               HR:           70 bpm. Exam Location:  [facility] Procedure: 2D Echo, Color Doppler, Cardiac Doppler and Intracardiac            Opacification Agent Indications:     Elevated troponin  History:         Patient has prior history of Echocardiogram examinations, most                  recent 04/06/2019. CAD, Prior CABG and Pacemaker,                  Signs/Symptoms:Chest Pain; Risk Factors:Hypertension and                  Dyslipidemia. Pt had cardiac cath and ablation at [facility] on 12/11.  Sonographer:     Charmayne Sheer RDCS (AE) Referring Phys:  [id number] Arvella Merles MANSY Diagnosing Phys: Ida Rogue MD IMPRESSIONS  1. Left ventricular ejection fraction, by visual estimation, is 25 to 30%. The left ventricle has severely decreased function. There is no left ventricular hypertrophy.  2. Mildly dilated left ventricular internal cavity size.  3. The left ventricle demonstrates regional wall motion abnormalities. Global hypokinesis with Severe hypokinesis /possiby akinesis of the inferior/posterior and apical wall. Unable to exclude lateral wall involvement.  4. Left ventricular diastolic parameters are consistent with Grade II diastolic dysfunction (pseudonormalization).  5. Global right ventricle has mildly reduced systolic function.The right ventricular size is normal. No increase in right ventricular wall thickness.  6. Left atrial size was mildly dilated.  7. Mildly elevated pulmonary artery systolic pressure.  8. Definity contrast agent was given IV to delineate the left ventricular endocardial borders. FINDINGS  Left Ventricle: Left ventricular ejection fraction, by visual estimation, is 25 to 30%. The left ventricle has severely decreased function. Definity contrast agent was given IV to delineate the left ventricular endocardial borders. The left ventricle demonstrates regional wall motion abnormalities. The left ventricular internal cavity size was mildly dilated left ventricle. There is no left ventricular  hypertrophy. Left ventricular diastolic parameters  are consistent with Grade II diastolic dysfunction (pseudonormalization). Normal left atrial pressure. Right Ventricle: The right ventricular size is normal. No increase in right ventricular wall thickness. Global RV systolic function is has mildly reduced systolic function. The tricuspid regurgitant velocity is 2.47 m/s, and with an assumed right atrial pressure of 10 mmHg, the estimated right ventricular systolic pressure is mildly elevated at 34.4 mmHg. Left Atrium: Left atrial size was mildly dilated. Right Atrium: Right atrial size was normal in size Pericardium: There is no evidence of pericardial effusion. Mitral Valve: The mitral valve is normal in structure. Mild mitral valve regurgitation. No evidence of mitral valve stenosis by observation. MV peak gradient, 3.6 mmHg. Tricuspid Valve: The tricuspid valve is normal in structure. Tricuspid valve regurgitation is not demonstrated. Aortic Valve: The aortic valve was not well visualized. Aortic valve regurgitation is not visualized. The aortic valve is structurally normal, with no evidence of sclerosis or stenosis. Aortic valve mean gradient measures 3.0 mmHg. Aortic valve peak gradient measures 4.7 mmHg. Aortic valve area, by VTI measures 2.77 cm. Pulmonic Valve: The pulmonic valve was normal in structure. Pulmonic valve regurgitation is not visualized. Pulmonic regurgitation is not visualized. Aorta: The aortic root, ascending aorta and aortic arch are all structurally normal, with no evidence of dilitation or obstruction. Venous: The inferior vena cava is normal in size with greater than 50% respiratory variability, suggesting right atrial pressure of 3 mmHg. IAS/Shunts: No atrial level shunt detected by color flow Doppler. There is no evidence of a patent foramen ovale. No ventricular septal defect is seen or detected. There is no evidence of an atrial septal defect.  LEFT VENTRICLE PLAX 2D LVIDd:          7.02 cm  Diastology LVIDs:         5.52 cm  LV e' lateral:   6.20 cm/s LV PW:         0.83 cm  LV E/e' lateral: 16.1 LV IVS:        0.76 cm  LV e' medial:    4.68 cm/s LVOT diam:     2.50 cm  LV E/e' medial:  21.4 LV SV:         108 ml LV SV Index:   52.93 LVOT Area:     4.91 cm  LEFT ATRIUM             Index LA diam:        4.00 cm 2.02 cm/m LA Vol (A2C):   42.2 ml 21.28 ml/m LA Vol (A4C):   60.4 ml 30.46 ml/m LA Biplane Vol: 52.9 ml 26.68 ml/m  AORTIC VALVE                   PULMONIC VALVE AV Area (Vmax):    2.45 cm    PV Vmax:       0.86 m/s AV Area (Vmean):   2.32 cm    PV Vmean:      57.300 cm/s AV Area (VTI):     2.77 cm    PV VTI:        0.191 m AV Vmax:           108.00 cm/s PV Peak grad:  3.0 mmHg AV Vmean:          74.000 cm/s PV Mean grad:  1.0 mmHg AV VTI:            0.195 m AV Peak Grad:      4.7 mmHg AV  Mean Grad:      3.0 mmHg LVOT Vmax:         53.90 cm/s LVOT Vmean:        34.900 cm/s LVOT VTI:          0.110 m LVOT/AV VTI ratio: 0.56  AORTA Ao Root diam: 3.50 cm MITRAL VALVE                         TRICUSPID VALVE MV Area (PHT): 4.06 cm              TR Peak grad:   24.4 mmHg MV Peak grad:  3.6 mmHg              TR Vmax:        247.00 cm/s MV Mean grad:  1.0 mmHg MV Vmax:       0.95 m/s              SHUNTS MV Vmean:      56.2 cm/s             Systemic VTI:  0.11 m MV VTI:        0.26 m                Systemic Diam: 2.50 cm MV PHT:        54.23 msec MV Decel Time: 187 msec MV E velocity: 100.00 cm/s 103 cm/s MV A velocity: 60.60 cm/s  70.3 cm/s MV E/A ratio:  1.65        1.5  Julien Nordmann MD Electronically signed by Julien Nordmann MD Signature Date/Time: 04/22/2019/12:47:17 PM    Final    CT Angio Chest/Abd/Pel for Dissection W and/or W/WO  Result Date: 04/22/2019 CLINICAL DATA:  Back pain. EXAM: CT ANGIOGRAPHY CHEST, ABDOMEN AND PELVIS TECHNIQUE: Multidetector CT imaging through the chest, abdomen and pelvis was performed using the standard protocol during bolus administration of  intravenous contrast. Multiplanar reconstructed images and MIPs were obtained and reviewed to evaluate the vascular anatomy. CONTRAST:  OMNIPAQUE IOHEXOL 350 MG/ML SOLN COMPARISON:  06/15/2015 FINDINGS: CTA CHEST FINDINGS Cardiovascular: There is no thoracic aortic dissection. There is no large centrally located pulmonary embolism. Coronary artery calcifications are noted. There are atherosclerotic changes of the thoracic aorta. The patient is status post prior median sternotomy. There is no large pericardial effusion. Mediastinum/Nodes: --No mediastinal or hilar lymphadenopathy. --No axillary lymphadenopathy. --No supraclavicular lymphadenopathy. --Normal thyroid gland. --The esophagus is unremarkable Lungs/Pleura: There is a partially calcified mass in the right upper lobe measuring approximately 2.4 cm. There this is favored to represent a benign process given the calcifications. is no large pleural effusion. Musculoskeletal: No chest wall abnormality. No acute or significant osseous findings. Review of the MIP images confirms the above findings. CTA ABDOMEN AND PELVIS FINDINGS VASCULAR Aorta: Atherosclerotic changes are noted throughout the aorta without evidence for an aneurysm. Celiac: Patent without evidence of aneurysm, dissection, vasculitis or significant stenosis. SMA: Patent without evidence of aneurysm, dissection, vasculitis or significant stenosis. Renals: Both renal arteries are patent without evidence of aneurysm, dissection, vasculitis, fibromuscular dysplasia or significant stenosis. IMA: Patent without evidence of aneurysm, dissection, vasculitis or significant stenosis. Inflow: Patent without evidence of aneurysm, dissection, vasculitis or significant stenosis. Veins: No obvious venous abnormality within the limitations of this arterial phase study. Review of the MIP images confirms the above findings. NON-VASCULAR Hepatobiliary: The liver is normal. Normal gallbladder.There is no biliary  ductal dilation. Pancreas: Normal contours without  ductal dilatation. No peripancreatic fluid collection. Spleen: No splenic laceration or hematoma. Adrenals/Urinary Tract: --Adrenal glands: No adrenal hemorrhage. --Right kidney/ureter: There appears to be some cortical scarring involving the upper pole the right kidney. There is no hydronephrosis. --Left kidney/ureter: No hydronephrosis or perinephric hematoma. --Urinary bladder: Unremarkable. Stomach/Bowel: --Stomach/Duodenum: No hiatal hernia or other gastric abnormality. Normal duodenal course and caliber. --Small bowel: No dilatation or inflammation. --Colon: No focal abnormality. --Appendix: Normal. Lymphatic: --No retroperitoneal lymphadenopathy. --No mesenteric lymphadenopathy. --No pelvic or inguinal lymphadenopathy. Reproductive: Unremarkable Other: No ascites or free air. There is a small fat containing left inguinal hernia. Musculoskeletal. There are multilevel degenerative changes throughout the visualized thoracolumbar spine, greatest at the L2-L3 and L4-L5 levels. There is some fat stranding in the right inguinal region. Review of the MIP images confirms the above findings. IMPRESSION: 1. No evidence for aortic dissection. 2. There is some fat stranding in the right inguinal region. This may be secondary to recent intervention in this location. 3. Multilevel degenerative changes of the lumbar spine. Aortic Atherosclerosis (ICD10-I70.0). Electronically Signed   By: Katherine Mantlehristopher  Green M.D.   On: 04/22/2019 02:56    Cardiac Studies  2D Echo 04/22/2019: 1. Left ventricular ejection fraction, by visual estimation, is 25 to 30%. The left ventricle has severely decreased function. There is no left ventricular hypertrophy. 2. Mildly dilated left ventricular internal cavity size. 3. The left ventricle demonstrates regional wall motion abnormalities. Global hypokinesis with Severe hypokinesis /possiby akinesis of the inferior/posterior and apical  wall. Unable to exclude lateral wall involvement. 4. Left ventricular diastolic parameters are consistent with Grade II diastolic dysfunction (pseudonormalization). 5. Global right ventricle has mildly reduced systolic function.The right ventricular size is normal. No increase in right ventricular wall thickness. 6. Left atrial size was mildly dilated. 7. Mildly elevated pulmonary artery systolic pressure. 8. Definity contrast agent was given IV to delineate the left ventricular endocardial borders. __________  2D Echo 04/06/2019: 1. Technically difficult study, even after contrast administration. Left ventricular ejection fraction, by visual estimation, is 25 to 30%. Inferior/lateral akinesis. The left ventricle has severely decreased function. There is no left ventricular  hypertrophy. 2. Mildly dilated left ventricular internal cavity size. 3. Left ventricular diastolic parameters are consistent with Grade II diastolic dysfunction (pseudonormalization). 4. Elevated left atrial pressure. 5. Global right ventricle is not well-visualized, but appears to have grossly normal systolic function.The right ventricular size is not well visualized. 6. Left atrial size was normal. 7. Right atrial size was normal. 8. Trivial pericardial effusion is present. 9. The mitral valve is normal in structure. Mild mitral valve regurgitation. 10. The tricuspid valve is not well visualized. Tricuspid valve regurgitation is not demonstrated. 11. The aortic valve is tricuspid. Aortic valve regurgitation is not visualized. No evidence of aortic valve sclerosis or stenosis. 12. The pulmonic valve was not well visualized. Pulmonic valve regurgitation is not visualized. 13. The inferior vena cava is normal in size with greater than 50% respiratory variability, suggesting right atrial pressure of 3 mmHg. 14. TR signal is inadequate for assessing pulmonary artery systolic  pressure.   ------------------------ PENDING ADDITION OF EMR AND IN PATIENT'S PAPER CHART: [facility] RECOR[facility]  [facility] Patent LIMA to LAD.  Unchanged LCA systems with collaterals to the RCA.  Known occluded native RCA and SVGs to the PDA and OM.  Overall, no change when compared with 2018 cath.  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  No pericardial effusion at case and.  Echo  EF 25 to 30% with inferior and lateral akinesis, worsened compared to prior.  Mitral regurgitation appears moderate in limited views with restricted p.m. VL, RV systolic function appears grossly normal, mild TR estimated RVSP at least 35 mmHg  ---------------------------------------- PENDING ADDITION OF EMR AND IN PATIENT'S PAPER CHART: [employer] INTERROGATION 12/18: THERAPY SUMMARY: 0 episodes total of V TE/VF/AT/AF.  Total ventricular pacing 9% since 12/11 AS-VS less than %, AS-VP 6.4%, AP-VS 91.2%, AP-VP 2.4%.  Longest AT/AF 3 minutes. OBSERVATIONS: V pacing, less than 90%.  Unable to measure LV thresholds and last 7 days.  RV capture management: Acute safety margin (4 times)> program margin(2X).  High atrial threshold on 04/21/2019.  Longest ventricular sensing episode since the last session is greater than 60 seconds.  Ventricular sensing episodes average 20.4 hours/day since last session.  Patient Profile     73 y.o. male with history of CAD s/p CABG 1997 with subsequent PCI's as outlined in HPI, HFrEF secondary to ICM with EF 20 to 30%, VT/VF s/p recent ICD firing x2 on 12/1 s/p VT ablation at [facility] intolerant to amiodarone secondary to liver and thyroid dysfunction on quinine and mexiletine, PAF on Pradaxa, agent orange exposure, HTN, HLD, and tobacco abuse and being seen today for CP.  Assessment & Plan    Elevated troponin/chest pain/back pain/CAD s/p CABG s/p PCI --No current chest pain. High-sensitivity troponin peaked at 200 and  down-trending.  Consider as elevated in the setting of recent VT ablation 12/11 as well as dislodged lead per EP consultation.  CTA of chest / abdomen/pelvis without evidence of PE or dissection.  Recent cardiac cath 12/10 with summary as above and pending actual records to be scanned into patient EMR.  Cath showed patent LIMA to LAD and unchanged LCA systems with collaterals to RCA.  Known occluded native RCA and SVG to PDA and OM.  Overall, no significant change when compared with 2018 cath.  Currently, no plans for emergent ischemic evaluation this admission and thus no indication for heparin. Continue Plavix and Pradaxa in place of aspirin given underlying atrial fibrillation. Discontinued ASA, as this was added this AM. Continue statin.   HFrEF 2/2 ICM/VT/VF --Mild bilateral lower extremity edema, which the patient reports is new for him. No SOB.  IVF was administered this admission but discontinued yesterday given low EF puts at risk for acute onset volume overload / edema.  No other signs or symptoms of worsening heart failure that would indicate need of IV Lasix at this time; however, would continue to monitor closely given, while wt today stable, he has increased ~7lbs from admission.  Continue oral Lasix, as well as BB, lisinopril, spironolactone with monitoring of renal function given contrast exposure.  [facility] echo as above with summary of most recent [facility] echo showing EF unchanged from 25 to 30% and WMA as noted above. Continue mexiletine with recommendation, per EP, that  patient will need further evaluation after discharge given suspicion of dislodged Metronic lead.  Accelerated junctional rhythm --Continue Toprol. Further recommendations pending EP formal evaluation.  PAF --Continue BB and Pradaxa.  CKDIII --Renal function stable and at baseline. As above, recommend against IVF given low EF.  HLD --Continue statin.    For questions or updates, please contact [facility] Please  consult [email or web address] for contact info under        Signed, Lennon Alstrom, PA-C  04/23/2019, 8:38 AM

## 2019-04-23 NOTE — Progress Notes (Signed)
Patient discharged to home. Tele and IV removed prior to discharge. Verbalizes understanding of discharge instructions.

## 2019-04-23 NOTE — Discharge Summary (Addendum)
Physician Discharge Summary  Andrew [name]:[id number] DOB: June 28, 1945 DOA: 04/22/2019  PCP: Dellia Nims, MD  Admit date: 04/22/2019 Discharge date: 04/23/2019  Admitted From: home Disposition:  home  Recommendations for Outpatient Follow-up:  1. Follow up with PCP in 1-2 weeks 2. F/U w/ EP in 1 week 3. F/u w/ cardio in 1 week  Home Health: no Equipment/Devices:   Discharge Condition: stable CODE STATUS: full  Diet recommendation: Heart Healthy   Brief/Interim Summary: HPI was taken from Dr. Buford Dresser  is a 73 y.o. pleasant Caucasian male with a known history of multiple medical problems that will be mentioned below, including coronary artery disease status post CABG, who presented to the emergency room with acute onset of midsternal chest pain felt as heaviness and graded 8/10 in severity with radiation to his back and relieved with sublingual nitroglycerin.  He denied any cough or wheezing or hemoptysis.  No fever or chills.  No nausea or vomiting or diaphoresis.  He denied any leg pain or edema.  He was recently hospitalized at [facility] for 1 night when he is AICD fired on December 1.  On Friday he had ablation of 2 areas per his report that were interfering with his AICD at the [facility] in [place], and we are likely giving him atrial fibrillation paroxysms.Marland Kitchen   Upon presentation to the emergency room, vital signs were within normal.  Labs were remarkable for troponin I of 200 antibiotic 29 with a creatinine of 1.51 that is stable with mild anemia slightly worse than previous levels 2 weeks ago.  COVID-19 test is currently pending.  Chest CTA ruled out aortic dissection.  It showed multilevel degenerative changes of the lumbar spine.  EKG showed paced rhythm with a rate of 73.  The patient was given 2 mg of IV morphine sulfate and 4 mg of IV Zofran as well as heparin IV bolus and drip.  He will be admitted to a telemetry bed for further evaluation and  management.  Hospital Course from Dr. Mayford Knife:  Cardio was consulted for chest pain which likely secondary to GERD as per cardio. The elevated troponins were likely secondary to recent intervention last week as per cardio. Pt's chest pain improved w/ H2 blockers. Of note, pt has his AICD interrogated and recommended that the pt see EP as an outpatient. Pt verbalized his understanding.     Discharge Diagnoses:  Active Problems:   CAD in native artery   Non-STEMI (non-ST elevated myocardial infarction) [facility])   Essential hypertension   Atypical chest pain   NSTEMI (non-ST elevated myocardial infarction) [facility])    Discharge Instructions  Discharge Instructions    Diet - low sodium heart healthy   Complete by: As directed    Discharge instructions   Complete by: As directed    F/U PCP in 1-2 weeks; F/u w/ EP in 1 week   Discharge instructions   Complete by: As directed    F/u cardio in 1 week   Increase activity slowly   Complete by: As directed      Allergies as of 04/23/2019      Reactions   Amiodarone Other (See Comments)   Reaction:  Unknown       Medication List    TAKE these medications   acetaminophen 325 MG tablet Commonly known as: TYLENOL Take 975 mg by mouth every 4 (four) hours as needed for mild pain.   atorvastatin 80 MG tablet Commonly known as: LIPITOR  Take 80 mg by mouth daily.   clopidogrel 75 MG tablet Commonly known as: PLAVIX Take 75 mg by mouth at bedtime.   dabigatran 150 MG Caps capsule Commonly known as: PRADAXA Take 150 mg by mouth 2 (two) times daily.   finasteride 5 MG tablet Commonly known as: PROSCAR Take 5 mg by mouth daily.   furosemide 20 MG tablet Commonly known as: LASIX Take 20 mg by mouth at bedtime.   gabapentin 300 MG capsule Commonly known as: NEURONTIN Take 600 mg by mouth 3 (three) times daily.   levothyroxine 25 MCG tablet Commonly known as: SYNTHROID Take 25 mcg by mouth as directed. 1 tab tues thurs sat  sun 2 mon weds fri   lisinopril 2.5 MG tablet Commonly known as: ZESTRIL Take 2.5 mg by mouth daily.   methocarbamol 750 MG tablet Commonly known as: ROBAXIN Take 750 mg by mouth 2 (two) times daily as needed for muscle spasms.   metoprolol succinate 25 MG 24 hr tablet Commonly known as: TOPROL-XL Take 25 mg by mouth daily.   mexiletine 150 MG capsule Commonly known as: MEXITIL Take 2 capsules (300 mg total) by mouth 3 (three) times daily.   niacin 500 MG tablet Take 500 mg by mouth at bedtime.   nitroGLYCERIN 0.4 MG SL tablet Commonly known as: NITROSTAT Place 0.4 mg under the tongue every 5 (five) minutes as needed for chest pain.   oxybutynin 10 MG 24 hr tablet Commonly known as: DITROPAN-XL Take 10 mg by mouth at bedtime.   spironolactone 25 MG tablet Commonly known as: ALDACTONE Take 12.5 mg by mouth daily.   tamsulosin 0.4 MG Caps capsule Commonly known as: FLOMAX Take 0.4 mg by mouth daily.   THERA-GESIC EX Apply 1 application topically 3 (three) times daily as needed (for pain).       Allergies  Allergen Reactions  . Amiodarone Other (See Comments)    Reaction:  Unknown     Consultations:  cardio   Procedures/Studies: DG Chest 2 View  Result Date: 04/05/2019 CLINICAL DATA:  Chest pain EXAM: CHEST - 2 VIEW COMPARISON:  07/27/2018 FINDINGS: Left AICD remains in place, unchanged. Prior CABG. Heart is borderline enlarged. Lingular scarring. Right lung clear. No effusions or acute bony abnormality. IMPRESSION: No active cardiopulmonary disease. Electronically Signed   By: Charlett Nose M.D.   On: 04/05/2019 20:35   ECHOCARDIOGRAM COMPLETE  Result Date: 04/22/2019   ECHOCARDIOGRAM REPORT   Patient Name:   Andrew [name] Date of Exam: 04/22/2019 Medical Rec #:  [id number]       Height:       67.0 in Accession #:    [id number]      Weight:       191.0 lb Date of Birth:  07/23/1945       BSA:          1.98 m Patient Age:    73 years        BP:            108/75 mmHg Patient Gender: M               HR:           70 bpm. Exam Location:  [facility] Procedure: 2D Echo, Color Doppler, Cardiac Doppler and Intracardiac            Opacification Agent Indications:     Elevated troponin  History:         Patient has prior history  of Echocardiogram examinations, most                  recent 04/06/2019. CAD, Prior CABG and Pacemaker,                  Signs/Symptoms:Chest Pain; Risk Factors:Hypertension and                  Dyslipidemia. Pt had cardiac cath and ablation at [facility] on 12/11.  Sonographer:     Humphrey Rolls RDCS (AE) Referring Phys:  [id number] Vernetta Honey MANSY Diagnosing Phys: Julien Nordmann MD IMPRESSIONS  1. Left ventricular ejection fraction, by visual estimation, is 25 to 30%. The left ventricle has severely decreased function. There is no left ventricular hypertrophy.  2. Mildly dilated left ventricular internal cavity size.  3. The left ventricle demonstrates regional wall motion abnormalities. Global hypokinesis with Severe hypokinesis /possiby akinesis of the inferior/posterior and apical wall. Unable to exclude lateral wall involvement.  4. Left ventricular diastolic parameters are consistent with Grade II diastolic dysfunction (pseudonormalization).  5. Global right ventricle has mildly reduced systolic function.The right ventricular size is normal. No increase in right ventricular wall thickness.  6. Left atrial size was mildly dilated.  7. Mildly elevated pulmonary artery systolic pressure.  8. Definity contrast agent was given IV to delineate the left ventricular endocardial borders. FINDINGS  Left Ventricle: Left ventricular ejection fraction, by visual estimation, is 25 to 30%. The left ventricle has severely decreased function. Definity contrast agent was given IV to delineate the left ventricular endocardial borders. The left ventricle demonstrates regional wall motion abnormalities. The left ventricular internal cavity size was mildly dilated left ventricle. There is  no left ventricular hypertrophy. Left ventricular diastolic parameters are consistent with Grade II diastolic dysfunction (pseudonormalization). Normal left atrial pressure. Right Ventricle: The right ventricular size is normal. No increase in right ventricular wall thickness. Global RV systolic function is has mildly reduced systolic function. The tricuspid regurgitant velocity is 2.47 m/s, and with an assumed right atrial pressure of 10 mmHg, the estimated right ventricular systolic pressure is mildly elevated at 34.4 mmHg. Left Atrium: Left atrial size was mildly dilated. Right Atrium: Right atrial size was normal in size Pericardium: There is no evidence of pericardial effusion. Mitral Valve: The mitral valve is normal in structure. Mild mitral valve regurgitation. No evidence of mitral valve stenosis by observation. MV peak gradient, 3.6 mmHg. Tricuspid Valve: The tricuspid valve is normal in structure. Tricuspid valve regurgitation is not demonstrated. Aortic Valve: The aortic valve was not well visualized. Aortic valve regurgitation is not visualized. The aortic valve is structurally normal, with no evidence of sclerosis or stenosis. Aortic valve mean gradient measures 3.0 mmHg. Aortic valve peak gradient measures 4.7 mmHg. Aortic valve area, by VTI measures 2.77 cm. Pulmonic Valve: The pulmonic valve was normal in structure. Pulmonic valve regurgitation is not visualized. Pulmonic regurgitation is not visualized. Aorta: The aortic root, ascending aorta and aortic arch are all structurally normal, with no evidence of dilitation or obstruction. Venous: The inferior vena cava is normal in size with greater than 50% respiratory variability, suggesting right atrial pressure of 3 mmHg. IAS/Shunts: No atrial level shunt detected by color flow Doppler. There is no evidence of a patent foramen ovale. No ventricular septal defect is seen or detected. There is no evidence of an atrial septal defect.  LEFT VENTRICLE  PLAX 2D LVIDd:         7.02 cm  Diastology LVIDs:  5.52 cm  LV e' lateral:   6.20 cm/s LV PW:         0.83 cm  LV E/e' lateral: 16.1 LV IVS:        0.76 cm  LV e' medial:    4.68 cm/s LVOT diam:     2.50 cm  LV E/e' medial:  21.4 LV SV:         108 ml LV SV Index:   52.93 LVOT Area:     4.91 cm  LEFT ATRIUM             Index LA diam:        4.00 cm 2.02 cm/m LA Vol (A2C):   42.2 ml 21.28 ml/m LA Vol (A4C):   60.4 ml 30.46 ml/m LA Biplane Vol: 52.9 ml 26.68 ml/m  AORTIC VALVE                   PULMONIC VALVE AV Area (Vmax):    2.45 cm    PV Vmax:       0.86 m/s AV Area (Vmean):   2.32 cm    PV Vmean:      57.300 cm/s AV Area (VTI):     2.77 cm    PV VTI:        0.191 m AV Vmax:           108.00 cm/s PV Peak grad:  3.0 mmHg AV Vmean:          74.000 cm/s PV Mean grad:  1.0 mmHg AV VTI:            0.195 m AV Peak Grad:      4.7 mmHg AV Mean Grad:      3.0 mmHg LVOT Vmax:         53.90 cm/s LVOT Vmean:        34.900 cm/s LVOT VTI:          0.110 m LVOT/AV VTI ratio: 0.56  AORTA Ao Root diam: 3.50 cm MITRAL VALVE                         TRICUSPID VALVE MV Area (PHT): 4.06 cm              TR Peak grad:   24.4 mmHg MV Peak grad:  3.6 mmHg              TR Vmax:        247.00 cm/s MV Mean grad:  1.0 mmHg MV Vmax:       0.95 m/s              SHUNTS MV Vmean:      56.2 cm/s             Systemic VTI:  0.11 m MV VTI:        0.26 m                Systemic Diam: 2.50 cm MV PHT:        54.23 msec MV Decel Time: 187 msec MV E velocity: 100.00 cm/s 103 cm/s MV A velocity: 60.60 cm/s  70.3 cm/s MV E/A ratio:  1.65        1.5  Julien Nordmann MD Electronically signed by Julien Nordmann MD Signature Date/Time: 04/22/2019/12:47:17 PM    Final    ECHOCARDIOGRAM COMPLETE  Result Date: 04/06/2019   ECHOCARDIOGRAM REPORT   Patient Name:   HOLLEY WIRT  Tunney Date of Exam: 04/06/2019 Medical Rec #:  [id number]       Height:       68.0 in Accession #:    [id number]      Weight:       207.0 lb Date of Birth:  [birth year]       BSA:           2.07 m Patient Age:    73 years        BP:           123/79 mmHg Patient Gender: M               HR:           80 bpm. Exam Location:  Inpatient Procedure: 2D Echo, Color Doppler, Cardiac Doppler and Intracardiac            Opacification Agent Indications:    I47.2 Ventricular tachycardia  History:        Patient has no prior history of Echocardiogram examinations.                 Prior CABG, Pacemaker and Defibrillator, Arrythmias:Atrial                 Fibrillation; Risk Factors:Dyslipidemia and Hypertension.  Sonographer:    Irving Burton Senior RDCS Referring Phys: [id number] Lonie Peak  Sonographer Comments: Technically difficult study due to poor echo windows and suboptimal apical window. IMPRESSIONS  1. Technically difficult study, even after contrast administration. Left ventricular ejection fraction, by visual estimation, is 25 to 30%. Inferior/lateral akinesis. The left ventricle has severely decreased function. There is no left ventricular hypertrophy.  2. Mildly dilated left ventricular internal cavity size.  3. Left ventricular diastolic parameters are consistent with Grade II diastolic dysfunction (pseudonormalization).  4. Elevated left atrial pressure.  5. Global right ventricle is not well-visualized, but appears to have grossly normal systolic function.The right ventricular size is not well visualized.  6. Left atrial size was normal.  7. Right atrial size was normal.  8. Trivial pericardial effusion is present.  9. The mitral valve is normal in structure. Mild mitral valve regurgitation. 10. The tricuspid valve is not well visualized. Tricuspid valve regurgitation is not demonstrated. 11. The aortic valve is tricuspid. Aortic valve regurgitation is not visualized. No evidence of aortic valve sclerosis or stenosis. 12. The pulmonic valve was not well visualized. Pulmonic valve regurgitation is not visualized. 13. The inferior vena cava is normal in size with greater than 50% respiratory  variability, suggesting right atrial pressure of 3 mmHg. 14. TR signal is inadequate for assessing pulmonary artery systolic pressure. FINDINGS  Left Ventricle: Left ventricular ejection fraction, by visual estimation, is 20 to 25%. The left ventricle has severely decreased function. The left ventricular internal cavity size was mildly dilated left ventricle. There is no left ventricular hypertrophy. Left ventricular diastolic parameters are consistent with Grade II diastolic dysfunction (pseudonormalization). Elevated left atrial pressure. Right Ventricle: The right ventricular size is not well visualized. Right vetricular wall thickness was not assessed. Global RV systolic function is has normal systolic function. Left Atrium: Left atrial size was normal in size. Right Atrium: Right atrial size was normal in size Pericardium: Trivial pericardial effusion is present. Mitral Valve: The mitral valve is normal in structure. Mild mitral valve regurgitation. Tricuspid Valve: The tricuspid valve is not well visualized. Tricuspid valve regurgitation is not demonstrated. Aortic Valve: The aortic valve is tricuspid. Aortic valve regurgitation is not visualized. The aortic  valve is structurally normal, with no evidence of sclerosis or stenosis. Pulmonic Valve: The pulmonic valve was not well visualized. Pulmonic valve regurgitation is not visualized. Aorta: The aortic root and ascending aorta are structurally normal, with no evidence of dilitation. Venous: The inferior vena cava is normal in size with greater than 50% respiratory variability, suggesting right atrial pressure of 3 mmHg. IAS/Shunts: The interatrial septum was not well visualized.  LEFT VENTRICLE PLAX 2D LVIDd:         6.20 cm  Diastology LVIDs:         5.60 cm  LV e' lateral:   5.87 cm/s LV PW:         1.00 cm  LV E/e' lateral: 14.6 LV IVS:        1.00 cm  LV e' medial:    4.46 cm/s LVOT diam:     2.20 cm  LV E/e' medial:  19.2 LV SV:         40 ml LV SV  Index:   18.74 LVOT Area:     3.80 cm  RIGHT VENTRICLE RV S prime:     10.00 cm/s TAPSE (M-mode): 1.9 cm LEFT ATRIUM             Index       RIGHT ATRIUM           Index LA diam:        4.30 cm 2.07 cm/m  RA Area:     18.90 cm LA Vol (A2C):   59.8 ml 28.84 ml/m RA Volume:   46.10 ml  22.23 ml/m LA Vol (A4C):   33.2 ml 16.01 ml/m LA Biplane Vol: 48.0 ml 23.15 ml/m  AORTIC VALVE LVOT Vmax:   50.30 cm/s LVOT Vmean:  36.300 cm/s LVOT VTI:    0.110 m  AORTA Ao Root diam: 3.50 cm Ao Asc diam:  3.50 cm MITRAL VALVE MV Area (PHT): 3.85 cm              SHUNTS MV PHT:        57.13 msec            Systemic VTI:  0.11 m MV Decel Time: 197 msec              Systemic Diam: 2.20 cm MV E velocity: 85.70 cm/s  103 cm/s MV A velocity: 102.00 cm/s 70.3 cm/s MV E/A ratio:  0.84        1.5  Oswaldo Milian MD Electronically signed by Oswaldo Milian MD Signature Date/Time: 04/06/2019/5:29:47 PM    Final    CT Angio Chest/Abd/Pel for Dissection W and/or W/WO  Result Date: 04/22/2019 CLINICAL DATA:  Back pain. EXAM: CT ANGIOGRAPHY CHEST, ABDOMEN AND PELVIS TECHNIQUE: Multidetector CT imaging through the chest, abdomen and pelvis was performed using the standard protocol during bolus administration of intravenous contrast. Multiplanar reconstructed images and MIPs were obtained and reviewed to evaluate the vascular anatomy. CONTRAST:  146mL OMNIPAQUE IOHEXOL 350 MG/ML SOLN COMPARISON:  06/15/2015 FINDINGS: CTA CHEST FINDINGS Cardiovascular: There is no thoracic aortic dissection. There is no large centrally located pulmonary embolism. Coronary artery calcifications are noted. There are atherosclerotic changes of the thoracic aorta. The patient is status post prior median sternotomy. There is no large pericardial effusion. Mediastinum/Nodes: --No mediastinal or hilar lymphadenopathy. --No axillary lymphadenopathy. --No supraclavicular lymphadenopathy. --Normal thyroid gland. --The esophagus is unremarkable  Lungs/Pleura: There is a partially calcified mass in the right upper lobe measuring approximately 2.4 cm. There this  is favored to represent a benign process given the calcifications. is no large pleural effusion. Musculoskeletal: No chest wall abnormality. No acute or significant osseous findings. Review of the MIP images confirms the above findings. CTA ABDOMEN AND PELVIS FINDINGS VASCULAR Aorta: Atherosclerotic changes are noted throughout the aorta without evidence for an aneurysm. Celiac: Patent without evidence of aneurysm, dissection, vasculitis or significant stenosis. SMA: Patent without evidence of aneurysm, dissection, vasculitis or significant stenosis. Renals: Both renal arteries are patent without evidence of aneurysm, dissection, vasculitis, fibromuscular dysplasia or significant stenosis. IMA: Patent without evidence of aneurysm, dissection, vasculitis or significant stenosis. Inflow: Patent without evidence of aneurysm, dissection, vasculitis or significant stenosis. Veins: No obvious venous abnormality within the limitations of this arterial phase study. Review of the MIP images confirms the above findings. NON-VASCULAR Hepatobiliary: The liver is normal. Normal gallbladder.There is no biliary ductal dilation. Pancreas: Normal contours without ductal dilatation. No peripancreatic fluid collection. Spleen: No splenic laceration or hematoma. Adrenals/Urinary Tract: --Adrenal glands: No adrenal hemorrhage. --Right kidney/ureter: There appears to be some cortical scarring involving the upper pole the right kidney. There is no hydronephrosis. --Left kidney/ureter: No hydronephrosis or perinephric hematoma. --Urinary bladder: Unremarkable. Stomach/Bowel: --Stomach/Duodenum: No hiatal hernia or other gastric abnormality. Normal duodenal course and caliber. --Small bowel: No dilatation or inflammation. --Colon: No focal abnormality. --Appendix: Normal. Lymphatic: --No retroperitoneal lymphadenopathy. --No  mesenteric lymphadenopathy. --No pelvic or inguinal lymphadenopathy. Reproductive: Unremarkable Other: No ascites or free air. There is a small fat containing left inguinal hernia. Musculoskeletal. There are multilevel degenerative changes throughout the visualized thoracolumbar spine, greatest at the L2-L3 and L4-L5 levels. There is some fat stranding in the right inguinal region. Review of the MIP images confirms the above findings. IMPRESSION: 1. No evidence for aortic dissection. 2. There is some fat stranding in the right inguinal region. This may be secondary to recent intervention in this location. 3. Multilevel degenerative changes of the lumbar spine. Aortic Atherosclerosis (ICD10-I70.0). Electronically Signed   By: Katherine Mantlehristopher  Green M.D.   On: 04/22/2019 02:56     Subjective: Pt denies any more chest pain  Discharge Exam: Vitals:   04/23/19 0410 04/23/19 0735  BP: 97/62 109/70  Pulse: 70 69  Resp: 16 19  Temp: 98.4 F (36.9 C) (!) 97.4 F (36.3 C)  SpO2: 97% 98%   Vitals:   04/22/19 1626 04/22/19 1956 04/23/19 0410 04/23/19 0735  BP: 106/70 94/64 97/62  109/70  Pulse: 73 75 70 69  Resp: 18 18 16 19   Temp: 97.7 F (36.5 C) 97.9 F (36.6 C) 98.4 F (36.9 C) (!) 97.4 F (36.3 C)  TempSrc: Oral Oral Oral Oral  SpO2: 100% 97% 97% 98%  Weight: 90.1 kg  90.2 kg   Height: 5\' 7"  (1.702 m)       General: Pt is alert, awake, not in acute distress Cardiovascular: S1/S2 +, no rubs, no gallops Respiratory: CTA bilaterally, no wheezing, no rhonchi Abdominal: Soft, NT, ND, bowel sounds + Extremities: b/l LE edema, no cyanosis    The results of significant diagnostics from this hospitalization (including imaging, microbiology, ancillary and laboratory) are listed below for reference.     Microbiology: Recent Results (from the past 240 hour(s))  SARS CORONAVIRUS 2 (TAT 6-24 HRS) Nasopharyngeal Nasopharyngeal Swab     Status: None   Collection Time: 04/22/19  1:37 AM    Specimen: Nasopharyngeal Swab  Result Value Ref Range Status   SARS Coronavirus 2 NEGATIVE NEGATIVE Final    Comment: (NOTE) SARS-CoV-2 target  nucleic acids are NOT DETECTED. The SARS-CoV-2 RNA is generally detectable in upper and lower respiratory specimens during the acute phase of infection. Negative results do not preclude SARS-CoV-2 infection, do not rule out co-infections with other pathogens, and should not be used as the sole basis for treatment or other patient management decisions. Negative results must be combined with clinical observations, patient history, and epidemiological information. The expected result is Negative. Fact Sheet for Patients: HairSlick.no Fact Sheet for Healthcare Providers: quierodirigir.com This test is not yet approved or cleared by the [place] FDA and  has been authorized for detection and/or diagnosis of SARS-CoV-2 by FDA under an Emergency Use Authorization (EUA). This EUA will remain  in effect (meaning this test can be used) for the duration of the COVID-19 declaration under Section 56 4(b)(1) of the Act, 21 U.S.C. section 360bbb-3(b)(1), unless the authorization is terminated or revoked sooner. Performed at [facility], [address] [place], [place] [address]      Labs: BNP (last 3 results) No results for input(s): BNP in the last 8760 hours. Basic Metabolic Panel: Recent Labs  Lab 04/22/19 0137 04/23/19 0926  NA 137 138  K 4.3 4.4  CL 105 106  CO2 22 24  GLUCOSE 118* 132*  BUN 29* 22  CREATININE 1.51* 1.25*  CALCIUM 8.9 8.9   Liver Function Tests: Recent Labs  Lab 04/22/19 0137  AST 31  ALT 27  ALKPHOS 88  BILITOT 0.9  PROT 6.9  ALBUMIN 3.7   Recent Labs  Lab 04/22/19 0137  LIPASE 23   No results for input(s): AMMONIA in the last 168 hours. CBC: Recent Labs  Lab 04/22/19 0137 04/23/19 0926  WBC 6.0 5.3  NEUTROABS 3.2  --   HGB 12.3*  12.1*  HCT 37.3* 35.6*  MCV 93.0 90.4  PLT 174 178   Cardiac Enzymes: No results for input(s): CKTOTAL, CKMB, CKMBINDEX, TROPONINI in the last 168 hours. BNP: Invalid input(s): POCBNP CBG: No results for input(s): GLUCAP in the last 168 hours. D-Dimer No results for input(s): DDIMER in the last 72 hours. Hgb A1c No results for input(s): HGBA1C in the last 72 hours. Lipid Profile Recent Labs    04/22/19 0411  CHOL 92  HDL 28*  LDLCALC 47  TRIG 83  CHOLHDL 3.3   Thyroid function studies No results for input(s): TSH, T4TOTAL, T3FREE, THYROIDAB in the last 72 hours.  Invalid input(s): FREET3 Anemia work up No results for input(s): VITAMINB12, FOLATE, FERRITIN, TIBC, IRON, RETICCTPCT in the last 72 hours. Urinalysis    Component Value Date/Time   COLORURINE YELLOW (A) 06/15/2015 1756   APPEARANCEUR CLOUDY (A) 06/15/2015 1756   APPEARANCEUR Hazy 10/20/2011 2257   LABSPEC 1.013 06/15/2015 1756   LABSPEC 1.024 10/20/2011 2257   PHURINE 5.0 06/15/2015 1756   GLUCOSEU NEGATIVE 06/15/2015 1756   GLUCOSEU Negative 10/20/2011 2257   HGBUR 2+ (A) 06/15/2015 1756   BILIRUBINUR NEGATIVE 06/15/2015 1756   BILIRUBINUR Negative 10/20/2011 2257   KETONESUR TRACE (A) 06/15/2015 1756   PROTEINUR 100 (A) 06/15/2015 1756   UROBILINOGEN 1.0 05/06/2009 0600   NITRITE NEGATIVE 06/15/2015 1756   LEUKOCYTESUR 3+ (A) 06/15/2015 1756   LEUKOCYTESUR Negative 10/20/2011 2257   Sepsis Labs Invalid input(s): PROCALCITONIN,  WBC,  LACTICIDVEN Microbiology Recent Results (from the past 240 hour(s))  SARS CORONAVIRUS 2 (TAT 6-24 HRS) Nasopharyngeal Nasopharyngeal Swab     Status: None   Collection Time: 04/22/19  1:37 AM   Specimen: Nasopharyngeal Swab  Result Value Ref Range Status   SARS Coronavirus 2 NEGATIVE NEGATIVE Final    Comment: (NOTE) SARS-CoV-2 target nucleic acids are NOT DETECTED. The SARS-CoV-2 RNA is generally detectable in upper and lower respiratory specimens during the  acute phase of infection. Negative results do not preclude SARS-CoV-2 infection, do not rule out co-infections with other pathogens, and should not be used as the sole basis for treatment or other patient management decisions. Negative results must be combined with clinical observations, patient history, and epidemiological information. The expected result is Negative. Fact Sheet for Patients: HairSlick.no Fact Sheet for Healthcare Providers: quierodirigir.com This test is not yet approved or cleared by the [place] FDA and  has been authorized for detection and/or diagnosis of SARS-CoV-2 by FDA under an Emergency Use Authorization (EUA). This EUA will remain  in effect (meaning this test can be used) for the duration of the COVID-19 declaration under Section 56 4(b)(1) of the Act, 21 U.S.C. section 360bbb-3(b)(1), unless the authorization is terminated or revoked sooner. Performed at [facility], [address], [place], [place] [address]      Time coordinating discharge: Over 30 minutes  SIGNED:   Charise Killian, MD  [facility] 04/23/2019, 7:04 PM Pager   If 7PM-7AM, please contact night-coverage [email or web address] Password [email or web address]

## 2019-04-23 NOTE — Discharge Summary (Signed)
The d/c summary is miss-labeled as progress note on 04/23/19

## 2019-05-14 ENCOUNTER — Emergency Department
Admission: EM | Admit: 2019-05-14 | Discharge: 2019-05-14 | Disposition: A | Discharge status: Home / Self Care | Payer: No Typology Code available for payment source | Attending: Emergency Medicine | Admitting: Emergency Medicine

## 2019-05-14 ENCOUNTER — Other Ambulatory Visit: Payer: Self-pay

## 2019-05-14 ENCOUNTER — Encounter: Payer: Self-pay | Admitting: Emergency Medicine

## 2019-05-14 DIAGNOSIS — R509 Fever, unspecified: Secondary | ICD-10-CM | POA: Diagnosis not present

## 2019-05-14 DIAGNOSIS — Z5321 Procedure and treatment not carried out due to patient leaving prior to being seen by health care provider: Secondary | ICD-10-CM | POA: Diagnosis not present

## 2019-05-14 DIAGNOSIS — R439 Unspecified disturbances of smell and taste: Secondary | ICD-10-CM | POA: Insufficient documentation

## 2019-05-14 DIAGNOSIS — R109 Unspecified abdominal pain: Secondary | ICD-10-CM | POA: Diagnosis present

## 2019-05-14 LAB — CBC
HCT: 41.5 % (ref 39.0–52.0)
Hemoglobin: 14 g/dL (ref 13.0–17.0)
MCH: 31 pg (ref 26.0–34.0)
MCHC: 33.7 g/dL (ref 30.0–36.0)
MCV: 92 fL (ref 80.0–100.0)
Platelets: 181 10*3/uL (ref 150–400)
RBC: 4.51 MIL/uL (ref 4.22–5.81)
RDW: 15.1 % (ref 11.5–15.5)
WBC: 7.8 10*3/uL (ref 4.0–10.5)
nRBC: 0 % (ref 0.0–0.2)

## 2019-05-14 LAB — COMPREHENSIVE METABOLIC PANEL
ALT: 31 U/L (ref 0–44)
AST: 30 U/L (ref 15–41)
Albumin: 4.3 g/dL (ref 3.5–5.0)
Alkaline Phosphatase: 81 U/L (ref 38–126)
Anion gap: 12 (ref 5–15)
BUN: 29 mg/dL — ABNORMAL HIGH (ref 8–23)
CO2: 23 mmol/L (ref 22–32)
Calcium: 9.6 mg/dL (ref 8.9–10.3)
Chloride: 102 mmol/L (ref 98–111)
Creatinine, Ser: 1.63 mg/dL — ABNORMAL HIGH (ref 0.61–1.24)
GFR calc Af Amer: 48 mL/min — ABNORMAL LOW (ref >60)
GFR calc non Af Amer: 41 mL/min — ABNORMAL LOW (ref >60)
Glucose, Bld: 114 mg/dL — ABNORMAL HIGH (ref 70–99)
Potassium: 4.7 mmol/L (ref 3.5–5.1)
Sodium: 137 mmol/L (ref 135–145)
Total Bilirubin: 1.3 mg/dL — ABNORMAL HIGH (ref 0.3–1.2)
Total Protein: 7.6 g/dL (ref 6.5–8.1)

## 2019-05-14 LAB — LIPASE, BLOOD: Lipase: 22 U/L (ref 11–51)

## 2019-05-14 MED ORDER — SODIUM CHLORIDE 0.9% FLUSH: 3.0000 mL | Freq: Once | INTRAVENOUS | Status: DC

## 2019-05-14 NOTE — ED Notes (Signed)
Pt to front desk stating that he was going to leave AMA.

## 2019-05-14 NOTE — ED Notes (Signed)
First Nurse Note: Pt to ED via [facility] for abdominal pain, fever, chills, loss of taste and smell and fever. Pt denies COVID exposure.

## 2019-05-14 NOTE — ED Notes (Addendum)
Pt wife called to check on the status of patient going back to a room. Informed wife that we could not tell her when he would go back to a room. Pt wife stated that pt had called dtr asking them to come pick him up and she thought that he needed to stay and be seen. This RN explained to pts wife that we could not make pt stay and be seen. Reassured her that we had taken lab work and he had been assessed by triage RN. Pt wife verbalized understanding.

## 2019-05-14 NOTE — ED Triage Notes (Signed)
Abdominal pain and rectal bleeding x 3 days.

## 2019-05-16 ENCOUNTER — Telehealth: Payer: Self-pay | Admitting: Emergency Medicine

## 2019-05-16 NOTE — Telephone Encounter (Signed)
Called patient due to lwot to inquire about condition and follow up plans.  No answer and no voicemail  

## 2021-05-30 IMAGING — CR DG CHEST 2V
2 series · 2 of 2 positions shown · non-contrast
Comparison: 07/27/2018

CLINICAL DATA: Chest pain

EXAM:
CHEST - 2 VIEW

[chest pa]
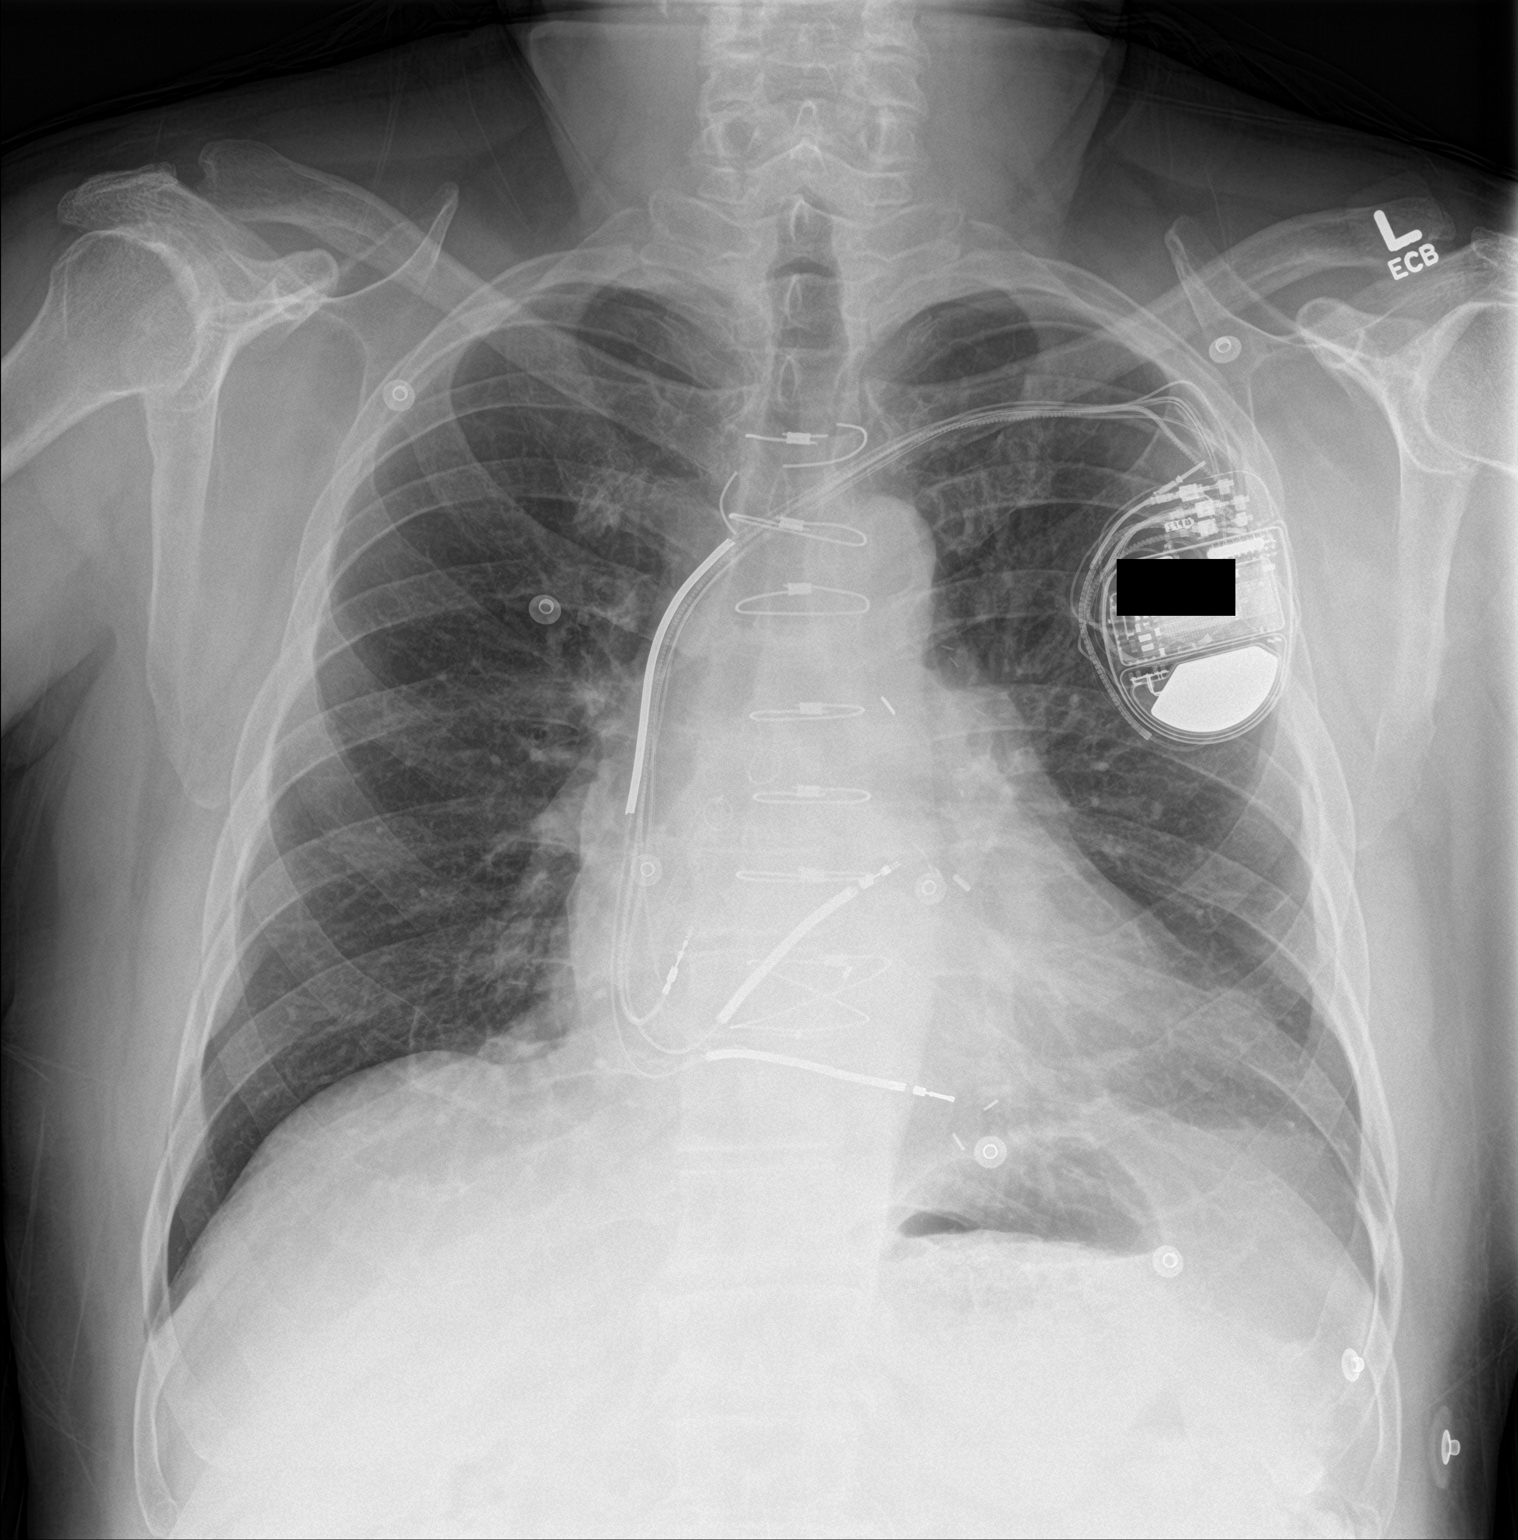

[chest lat]
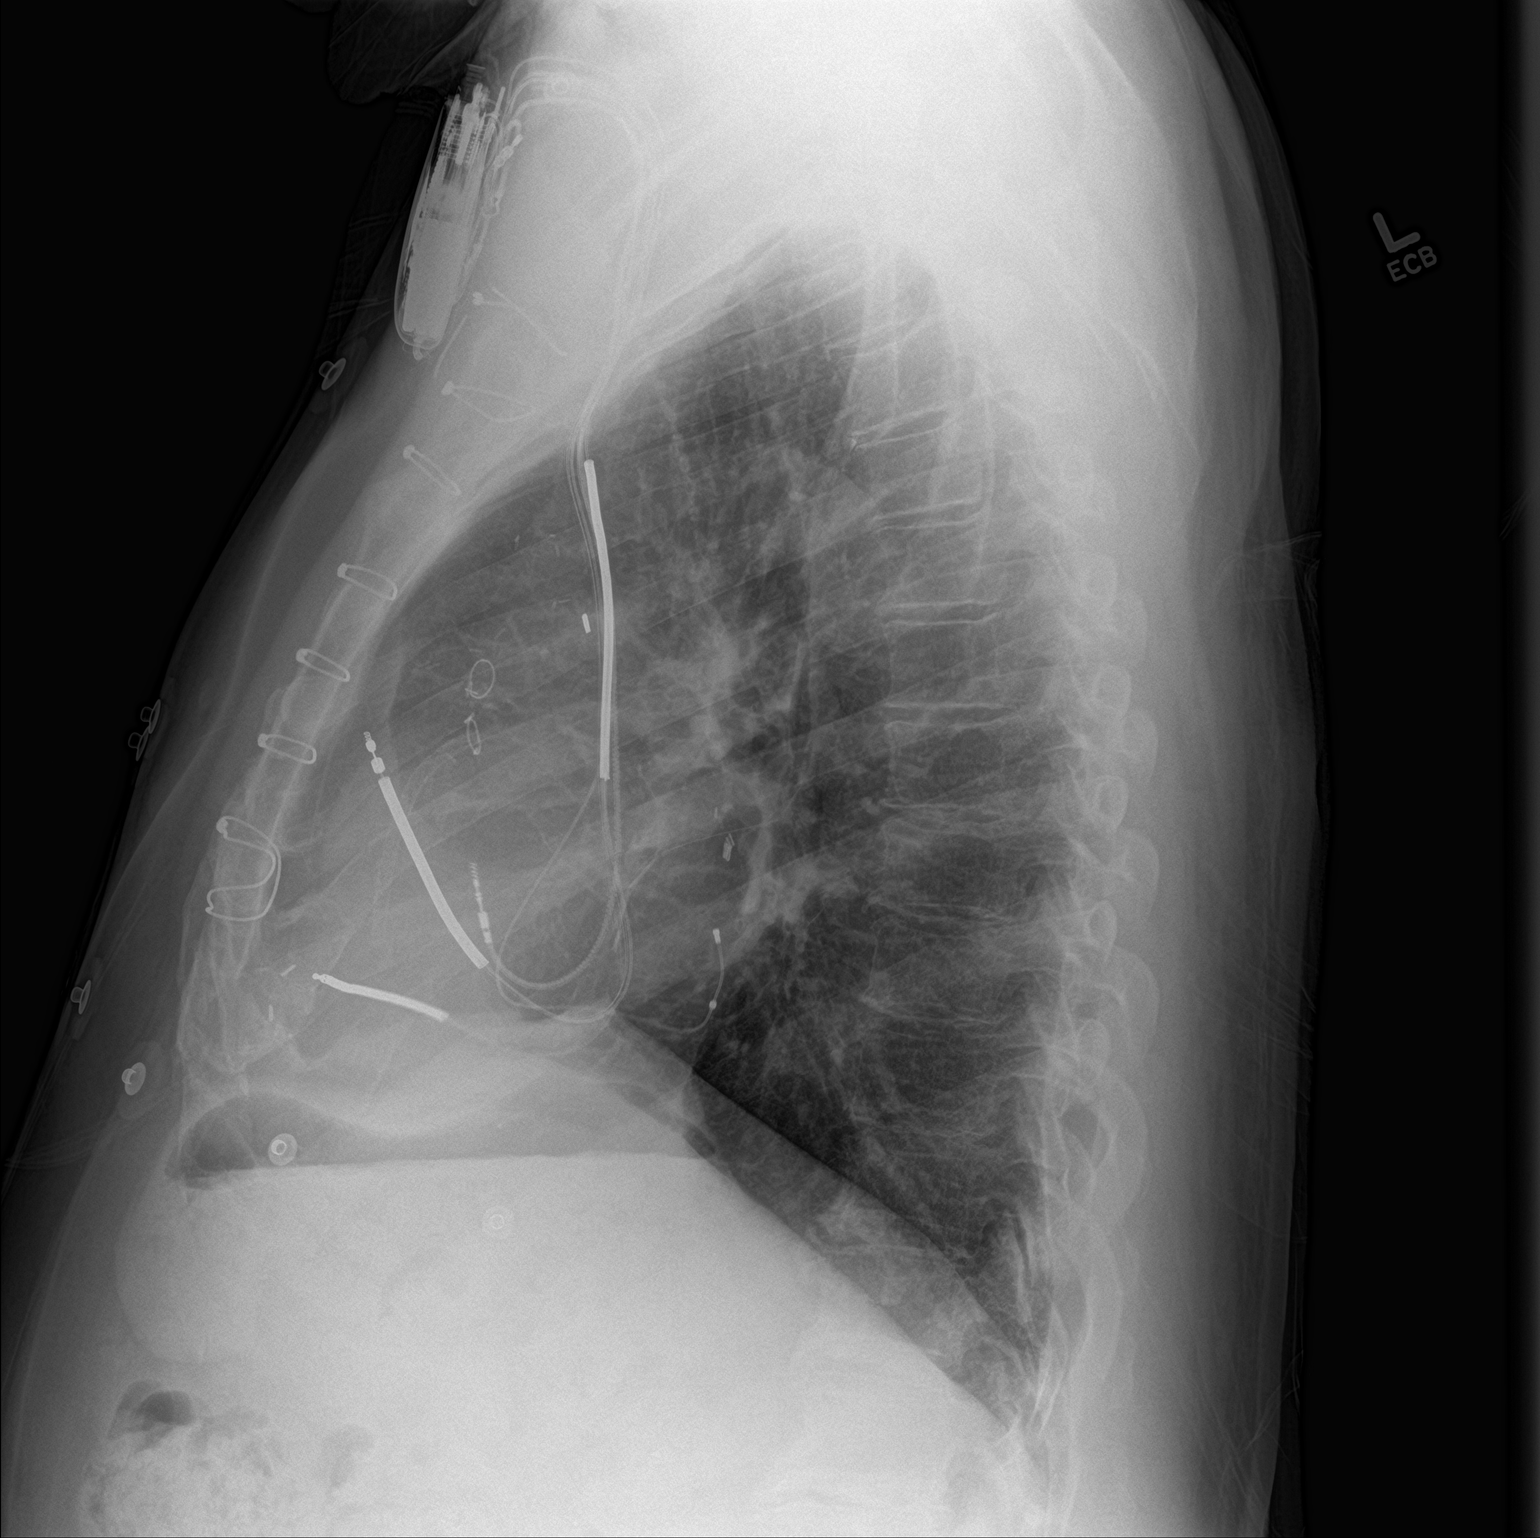

[2 of 2 positions shown; findings below may reference images not displayed]

FINDINGS: Left AICD remains in place, unchanged. Prior CABG. Heart is
borderline enlarged. Lingular scarring. Right lung clear. No
effusions or acute bony abnormality.
IMPRESSION: No active cardiopulmonary disease.
